# Patient Record
Sex: Female | Born: 1965 | Race: White | Hispanic: No | Marital: Married
Health system: Midwestern US, Community
[De-identification: ages and names within clinical notes are randomized; demographics above are authoritative.]

## PROBLEM LIST (undated history)

## (undated) DIAGNOSIS — R52 Pain, unspecified: Secondary | ICD-10-CM

## (undated) DIAGNOSIS — M05741 Rheumatoid arthritis with rheumatoid factor of right hand without organ or systems involvement: Principal | ICD-10-CM

## (undated) DIAGNOSIS — K7689 Other specified diseases of liver: Secondary | ICD-10-CM

## (undated) DIAGNOSIS — Z1231 Encounter for screening mammogram for malignant neoplasm of breast: Secondary | ICD-10-CM

## (undated) DIAGNOSIS — E559 Vitamin D deficiency, unspecified: Secondary | ICD-10-CM

## (undated) DIAGNOSIS — Z01818 Encounter for other preprocedural examination: Secondary | ICD-10-CM

---

## 2003-07-07 NOTE — Unmapped (Signed)
Signed by   LinkLogic on 07/08/2003 at 06:52:12  Patient: Paula Good  Note: All result statuses are Final unless otherwise noted.    Tests: (1) NM-DEXA SCAN AXIAL [phone])    Order NotePricilla Handler Order Number: [id number]    Order Note:     *** VERIFIED [facility]  Reason:  REPORT TO FAX [phone]  Dict.Staff: Lovey Newcomer    Verified By: Onalee Hua   Ver: 07/07/03   5:06 pm  Exams:  NM-DEXA SCAN AXIAL    CLINICAL: This patient was referred for evaluation of bone  mineral density due to long term use of Prednisone.    FINDINGS:  Bone mineral density (BMD) findings are as follows:    SPINE L1-4:  0.91 gm/cm2  RIGHT HIP:  Femoral neck:  0.86 gm/cm2  Total hip:  0.83 gm/cm2    The following three evaluations are made by comparing this  patient's BMD values with those of young healthy controls and  with those of the patient's age:  (1) The percentage of the patient's remaining bone mineral  compared to the mean of young healthy controls (age 66-40).  (2) The T-score comparing the patient's BMD values with the mean  of values found in young healthy controls and expressed in  standard deviations (SD)*.  (3) The Z-score comparing the patient's BMD values with the mean  of values found in subjects of this patient's age and expressed  in standard deviations (SD).    SPINE (L1-4):  Percentage of remaining bone:  77%  T-score:  -2.2 SD  Z-score:  -2.8 SD    RIGHT HIP:  FEMORAL NECK:  Percentage of remaining bone:  88%  T-score:  -1.0 SD  Z-score:  -1.2 SD    TOTAL HIP:  Percentage of remaining bone:  83%  T-score:  -1.4 SD  Z-score:  -1.7 SD    *The World Health Organization considers a T-score between -1.0  and -2.5 SD as osteopenia and a T-score lower than -2.5 SD from  the mean to indicate osteoporosis.    IMPRESSIONS:  THIS PATIENT'S T-SCORE IN THE RIGHT HIP IS IN THE UPPER PART OF  THE BORDERLINE RANGE BETWEEN -1.0 AND -2.5 AND MEETS THE WORLD  HEALTH [employer]GANIZATION'S CRITERIA FOR  BORDERLINE OSTEOPOROSIS OR  OSTEOPENIA.    THIS PATIENT'S T-SCORE IN THE LUMBAR SPINE IS IN THE LOWEST PART  OF THE BORDERLINE RANGE BETWEEN -1.0 AND -2.5 AND MEETS THE  [employer]LD [employer]IZ[employer]'S CRITERIA FOR BORDERLINE OSTEOPOROSIS  OR OSTEOPENIA.    THE RISK OF FRACTURE INCREASES APPROXIMATELY TWO-FOLD FOR EACH 1  SD DECREASE IN T-SCORE.  DEPENDING ON OTHER FACTORS SUCH AS RISK  OF TRAUMA OR FALLS, FRACTURE RISK MAY BE HIGHER.    PHARMACOLOGIC INTERVENTION FOR THIS PATIENT SHOULD BE  CONSIDERED.    GENERALLY, A REPEAT STUDY TO MONITOR PROGRESSION OF DISEASE OR  RESPONSE TO PHARMACOLOGIC THERAPY SHOULD BE PERFORMED AT 2-3    YEAR INTERVALS.  **** end of result ****    Note: An exclamation mark (!) indicates a result that was not dispersed into   the flowsheet.  Document Creation Date: 07/08/2003 6:52 AM  _______________________________________________________________________    (1) Order result status: Final  Collection or observation date-time: 07/07/2003 14:00:00  Requested date-time: 07/07/2003 14:25:00  Receipt date-time:   Reported date-time: 07/07/2003 17:06:47  Referring Physician: Michelle Piper Cecilia Vancleve  Ordering Physician: Michelle Piper Onesty Clair (NEFFGW)  Specimen Source:   Source: QRS  Filler Order Number: [id number]  Lab site: [facility]

## 2003-07-13 NOTE — Unmapped (Signed)
Signed by   LinkLogic on 07/16/2003 at 11:31:42  Patient: Paula Good  Note: All result statuses are Final unless otherwise noted.    Tests: (1) CT-PELVIS WITH CONTRAST [phone])    Order NotePricilla Handler Order Number: [id number]    Order Note:     *** VERIFIED [facility]  Reason:  RUQ PAIN ELEVATED LFT AND CA  Dict.Staff: Vilinda Flake  Dict.Res: VAGAL, ACHALA  Verified By: Vilinda Flake    Ver: 07/16/03  11:31 am  Exams:  CT-ABDOMEN WITH CONTRAST  CT-PELVIS WITH CONTRAST    CT abdomen pelvis with contrast on 07/13/2003.    Indication: Right upper quadrant pain elevated LFT and CA    Comparison: None.    Technique:    5 mm contiguous axial sections were obtained through the abdomen  and pelvis with portal and intravenous contrast. The  field-of-view studies 36 cm.    Findings:    Patient is status post cholecystectomy. The liver is normal with  no focal lesions. There is no intra or extrahepatic biliary  dilatation. Bilateral kidneys, adrenals, pancreas are normal.  There is mild splenomegaly. Visualized bowel loops are normal.  There is no lymphadenopathy  in the abdomen and pelvis. There is  no ascites.    There is a 3.5 x 3.0 cm simple right ovarian cyst. The uterus is  normal.    The lung bases are normal. Visualized bony structures are normal.      Impression CT abdomen:    1. Mild splenomegaly.    2. Status post cholecystectomy.    3. Normal liver with no focal lesions and no biliary dilatation.      Impression CT pelvis:    Right ovarian cyst. Otherwise normal CT pelvis study.  **** end of result ****    Note: An exclamation mark (!) indicates a result that was not dispersed into   the flowsheet.  Document Creation Date: 07/16/2003 11:31 AM  _______________________________________________________________________    (1) Order result status: Final  Collection or observation date-time: 07/13/2003 16:22:58  Requested date-time: 07/13/2003 15:15:00  Receipt date-time:   Reported  date-time: 07/16/2003 11:31:33  Referring Physician: Michelle Piper Ojani Berenson  Ordering Physician: Michelle Piper Starkeisha Vanwinkle (NEFFGW)  Specimen Source:   Source: QRS  Filler Order Number: [id number]  Lab site: [facility]

## 2003-07-13 NOTE — Unmapped (Signed)
Signed by   LinkLogic on 07/16/2003 at 11:31:41  Patient: Paula Good  Note: All result statuses are Final unless otherwise noted.    Tests: (1) CT-ABDOMEN WITH CONTRAST ([id number])    Order NotePricilla Handler Order Number: [id number]    Order Note:     *** VERIFIED [facility]  Reason:  RUQ PAIN ELEVATED LFT AND CA  Dict.Staff: Vilinda Flake  Dict.Res: VAGAL, ACHALA  Verified By: Vilinda Flake    Ver: 07/16/03  11:31 am  Exams:  CT-ABDOMEN WITH CONTRAST  CT-PELVIS WITH CONTRAST    CT abdomen pelvis with contrast on 07/13/2003.    Indication: Right upper quadrant pain elevated LFT and CA    Comparison: None.    Technique:    5 mm contiguous axial sections were obtained through the abdomen  and pelvis with portal and intravenous contrast. The  field-of-view studies 36 cm.    Findings:    Patient is status post cholecystectomy. The liver is normal with  no focal lesions. There is no intra or extrahepatic biliary  dilatation. Bilateral kidneys, adrenals, pancreas are normal.  There is mild splenomegaly. Visualized bowel loops are normal.  There is no lymphadenopathy  in the abdomen and pelvis. There is  no ascites.    There is a 3.5 x 3.0 cm simple right ovarian cyst. The uterus is  normal.    The lung bases are normal. Visualized bony structures are normal.      Impression CT abdomen:    1. Mild splenomegaly.    2. Status post cholecystectomy.    3. Normal liver with no focal lesions and no biliary dilatation.      Impression CT pelvis:    Right ovarian cyst. Otherwise normal CT pelvis study.  **** end of result ****    Note: An exclamation mark (!) indicates a result that was not dispersed into   the flowsheet.  Document Creation Date: 07/16/2003 11:31 AM  _______________________________________________________________________    (1) Order result status: Final  Collection or observation date-time: 07/13/2003 16:23:00  Requested date-time: 07/13/2003 15:15:00  Receipt date-time:   Reported  date-time: 07/16/2003 11:31:33  Referring Physician: Michelle Piper Brizeida Mcmurry  Ordering Physician: Michelle Piper Kaislyn Gulas (NEFFGW)  Specimen Source:   Source: QRS  Filler Order Number: [id number]  Lab site: [facility]

## 2003-07-21 NOTE — Unmapped (Signed)
Signed by Lolita Patella. Gerrety RMA on 07/21/2003 at 10:31:32    Phone Note   Call from Other Clinic  Caller: BARRETT/DR PALASCHEK/[phone]  Call For: Neff/Nancy  Reason for Call: Need Referral Information  Request: Talk with Nurse  Summary of call: Needs to know why this patient needs to be seen by them before they can schedule. Needs a letter of referral from Dr.Neff also.    This department is not currently using the EMR system. Please see the paper chart for this patient for the response to this phone message.  Initial call taken by: Lolita Patella. Gerrety RMA,  July 21, 2003 10:31 AM

## 2003-07-30 NOTE — Unmapped (Signed)
Signed by   LinkLogic on 08/04/2003 at 10:55:57  Patient: Paula Good  Note: All result statuses are Final unless otherwise noted.    Tests: (1) MRI-ANGIO ABDOMEN W/WO CON [phone])    Order NotePricilla Handler Order Number: [id number]    Order Note:     *** VERIFIED ***  [facility]  Reason:  PANCREATITIS AND SPLENOMEGALLY  Dict.Staff: Zachery Conch    Verified By: Zachery Conch       Ver: 08/04/03  10:55 am  Exams:  Rennie Natter W/WO CONTRAST  MRI-ANGIO ABDOMEN W/WO CON    Dictated: 07/30/03 at 15:10    07/30/03  MRI ABDOMEN WITH & WITHOUT CONTRAST, MRA ABDOMEN WITH CONTRAST:  INDICATION: Pancreatitis and splenomegaly.    TECHNIQUE:  MRI/MRA of the abdomen performed without and with  intravenous Gadolinium administration.  The protocol utilized is  the same as for pretransplant evaluation.    FINDINGS:    The spleen is enlarged measuring up to 13.9 x 6.5 cm transverse.    The spleen shows normal signal.    The liver shows normal signal.    The pancreas is unremarkable.  The pancreatic duct, common bile  duct, and intrahepatic ducts are within normal limits.    The gallbladder is not identified, compatible with previous  cholecystectomy.    No abnormal lymph nodes are detected.    Visualized marrow signal is normal.    The kidneys, adrenal glands, and retroperitoneal structures show  normal signal.    Bilateral breast prostheses are incompletely seen.    MRI ABDOMEN IMPRESSION:    1.  NONSPECIFIC SPLENOMEGALY WITHOUT FOCAL SIGNAL ABNORMALITY.      MRA FINDINGS:    The arterial phase is limited by motion artifact.  Dual left and  single right renal arteries are present without significant  stenosis.  The celiac and superior mesenteric artery origins are  patent.  The degree of motion precludes evaluation for accessory  or replaced hepatic vessels.    The portal venous phase shows significantly less motion  artifact.  The splenic vein, superior mesenteric vein, and  portal vein are widely patent.  Hepatic  veins are normal in  appearance.    MRA IMPRESSION:    1.  NEGATIVE MRA OF THE ABDOMEN.  VENOUS PHASE ALSO SHOWS NO  ABNORMALITY.      /agf  **** end of result ****    Note: An exclamation mark (!) indicates a result that was not dispersed into   the flowsheet.  Document Creation Date: 08/04/2003 10:55 AM  _______________________________________________________________________    (1) Order result status: Final  Collection or observation date-time: 07/30/2003 10:21:00  Requested date-time: 07/30/2003 10:21:00  Receipt date-time:   Reported date-time: 08/04/2003 10:55:47  Referring Physician: Michelle Piper Dewell Monnier  Ordering Physician: Michelle Piper Sangeeta Youse (NEFFGW)  Specimen Source:   Source: QRS  Filler Order Number: [id number]  Lab site: [facility]

## 2003-07-30 NOTE — Unmapped (Signed)
Signed by   LinkLogic on 08/04/2003 at 10:55:57  Patient: Ladean Tegtmeyer  Note: All result statuses are Final unless otherwise noted.    Tests: (1) MRI-ABDOMEN W/WO CONTRAST [phone])    Order NotePricilla Handler Order Number: [id number]    Order Note:     *** VERIFIED ***  [facility]  Reason:  PANCREATITIS AND SPLENOMEGALLY  Dict.Staff: Zachery Conch    Verified By: Zachery Conch       Ver: 08/04/03  10:55 am  Exams:  Rennie Natter W/WO CONTRAST  MRI-ANGIO ABDOMEN W/WO CON    Dictated: 07/30/03 at 15:10    07/30/03  MRI ABDOMEN WITH & WITHOUT CONTRAST, MRA ABDOMEN WITH CONTRAST:  INDICATION: Pancreatitis and splenomegaly.    TECHNIQUE:  MRI/MRA of the abdomen performed without and with  intravenous Gadolinium administration.  The protocol utilized is  the same as for pretransplant evaluation.    FINDINGS:    The spleen is enlarged measuring up to 13.9 x 6.5 cm transverse.    The spleen shows normal signal.    The liver shows normal signal.    The pancreas is unremarkable.  The pancreatic duct, common bile  duct, and intrahepatic ducts are within normal limits.    The gallbladder is not identified, compatible with previous  cholecystectomy.    No abnormal lymph nodes are detected.    Visualized marrow signal is normal.    The kidneys, adrenal glands, and retroperitoneal structures show  normal signal.    Bilateral breast prostheses are incompletely seen.    MRI ABDOMEN IMPRESSION:    1.  NONSPECIFIC SPLENOMEGALY WITHOUT FOCAL SIGNAL ABNORMALITY.      MRA FINDINGS:    The arterial phase is limited by motion artifact.  Dual left and  single right renal arteries are present without significant  stenosis.  The celiac and superior mesenteric artery origins are  patent.  The degree of motion precludes evaluation for accessory  or replaced hepatic vessels.    The portal venous phase shows significantly less motion  artifact.  The splenic vein, superior mesenteric vein, and  portal vein are widely patent.  Hepatic  veins are normal in  appearance.    MRA IMPRESSION:    1.  NEGATIVE MRA OF THE ABDOMEN.  VENOUS PHASE ALSO SHOWS NO  ABNORMALITY.      /agf  **** end of result ****    Note: An exclamation mark (!) indicates a result that was not dispersed into   the flowsheet.  Document Creation Date: 08/04/2003 10:55 AM  _______________________________________________________________________    (1) Order result status: Final  Collection or observation date-time: 07/30/2003 11:15:00  Requested date-time: 07/30/2003 10:21:00  Receipt date-time:   Reported date-time: 08/04/2003 10:55:47  Referring Physician: Michelle Piper Mireyah Chervenak  Ordering Physician: Michelle Piper Jayveion Stalling (NEFFGW)  Specimen Source:   Source: QRS  Filler Order Number: [id number]  Lab site: [facility]

## 2003-08-03 NOTE — Unmapped (Signed)
Signed by   LinkLogic on 08/04/2003 at 14:21:46  Patient: Paula Good  Note: All result statuses are Final unless otherwise noted.    Tests: (1) HOMOCYSTEINE, SERUM (HOMOCYT)  ! Homocysteine              9.73 MCMOL/L                5 - 12      Testing performed by [facility] [address]  [place], [place]., [address]    Note: An exclamation mark (!) indicates a result that was not dispersed into   the flowsheet.  Document Creation Date: 08/04/2003 2:21 PM  _______________________________________________________________________    (1) Order result status: Final  Collection or observation date-time: 08/03/2003 15:34  Requested date-time: 08/03/2003 15:34  Receipt date-time: 08/04/2003 14:21  Reported date-time: 08/04/2003 14:21  Referring Physician:    Ordering Physician: Michelle Piper Kemora Pinard (NEFFGW)  Specimen Source: S&SERUM     SST N&SST (2 ml S Refrig)  Source: [facility] Order Number: [id number] LA01  Lab site: [facility]  [id number]

## 2003-08-03 NOTE — Unmapped (Signed)
Signed by   LinkLogic on 08/04/2003 at 12:08:15  Patient: Allycia Avina  Note: All result statuses are Final unless otherwise noted.    Tests: (1) ANTI-THROMBIN 3, (ATIII) (AT3)  ! AntiThromb III, Func [L]  62 % Normal                 80-120    Note: An exclamation mark (!) indicates a result that was not dispersed into   the flowsheet.  Document Creation Date: 08/04/2003 12:08 PM  _______________________________________________________________________    (1) Order result status: Final  Collection or observation date-time: 08/03/2003 15:34  Requested date-time: 08/03/2003 15:34  Receipt date-time: 08/03/2003 17:56  Reported date-time: 08/04/2003 12:08  Referring Physician:    Ordering Physician: Michelle Piper Alyas Creary (NEFFGW)  Specimen Source: P&PLASMA     BLUE3&3 BLUE (Plasma FRZ ALIQUOTS)  Source: [facility] Order Number: [id number] LA01  Lab site: [facility]  [id number]

## 2003-08-03 NOTE — Unmapped (Signed)
Signed by   LinkLogic on 08/04/2003 at 12:08:23  Patient: Paula Good  Note: All result statuses are Final unless otherwise noted.    Tests: (1) PROTEIN S, FUNCTIONAL (PROSFX)  ! Protein S, Func      [L]  48 % Normal                 56-133      Reference Range: Female 77-130%; Female 56-133%.  Functional  protein S may   be falsely decreased when factor VIII is elevated (>250%) and / or the patient   is positive for the factor V (Leiden) variant.    Note: An exclamation mark (!) indicates a result that was not dispersed into   the flowsheet.  Document Creation Date: 08/04/2003 12:08 PM  _______________________________________________________________________    (1) Order result status: Final  Collection or observation date-time: 08/03/2003 15:34  Requested date-time: 08/03/2003 15:34  Receipt date-time: 08/03/2003 17:56  Reported date-time: 08/04/2003 12:08  Referring Physician:    Ordering Physician: Michelle Piper Aundrea Horace (NEFFGW)  Specimen Source: P&PLASMA     BLUE3&3 BLUE (Plasma FRZ ALIQUOTS)  Source: [facility] Order Number: [id number] LA01  Lab site: [facility]  [id number]

## 2003-08-03 NOTE — Unmapped (Signed)
Signed by   LinkLogic on 08/04/2003 at 12:08:25  Patient: Paula Good  Note: All result statuses are Final unless otherwise noted.    Tests: (1) PROTEIN C, FUNCTIONAL (PROCFX)  ! Protein C, Func      [L]  38 % Normal                 84-158      Functional protein C may be falsely decreased when factor VIII is elevated   (>250%) and / or the patient is positive for the factor V (Leiden) variant.    Note: An exclamation mark (!) indicates a result that was not dispersed into   the flowsheet.  Document Creation Date: 08/04/2003 12:08 PM  _______________________________________________________________________    (1) Order result status: Final  Collection or observation date-time: 08/03/2003 15:34  Requested date-time: 08/03/2003 15:34  Receipt date-time: 08/03/2003 17:56  Reported date-time: 08/04/2003 12:08  Referring Physician:    Ordering Physician: Michelle Piper Ardean Melroy (NEFFGW)  Specimen Source: P&PLASMA     BLUE3&3 BLUE (Plasma FRZ ALIQUOTS)  Source: [facility] Order Number: [id number] LA01  Lab site: [facility]  [id number]

## 2003-08-03 NOTE — Unmapped (Signed)
Signed by   LinkLogic on 08/04/2003 at 12:16:53  Patient: Paula Good  Note: All result statuses are Final unless otherwise noted.    Tests: (1) FACTOR 5 LEIDEN SCREEN (F5LS)  ! Act Prot C Resist         2.49                        2.10-3.50    Note: An exclamation mark (!) indicates a result that was not dispersed into   the flowsheet.  Document Creation Date: 08/04/2003 12:16 PM  _______________________________________________________________________    (1) Order result status: Final  Collection or observation date-time: 08/03/2003 15:34  Requested date-time: 08/03/2003 15:34  Receipt date-time: 08/03/2003 17:56  Reported date-time: 08/04/2003 12:16  Referring Physician:    Ordering Physician: Michelle Piper Zahriyah Joo (NEFFGW)  Specimen Source: P&PLASMA     BLU + LAV&BLUE (P Frz) + LAV (Refrig)  Source: [facility] Order Number: [id number] LA01  Lab site: [facility]  [id number]

## 2003-08-19 NOTE — Unmapped (Signed)
Signed by   LinkLogic on 08/19/2003 at 11:40:31  Patient: Paula Good  Note: All result statuses are Final unless otherwise noted.    Tests: (1) US-ABDOMEN LIMITED ([id number])    Order NotePricilla Handler Order Number: [id number]    Order Note:     *** VERIFIED ***  [facility]  Reason:  HEP C ABD PAIN, USE DOPPLER  Dict.Staff: Loni Dolly    Verified By: Loni Dolly       Ver: 08/19/03  11:40 am  Exams:  US-ABDOMEN LIMITED      Abdominal ultrasound    Clinical history is hepatitis C and splenomegaly.    Findings:    The gallbladder is surgically absent. The common bile duct is 4  mm in diameter, and is not dilated. There is no abnormality of  the pancreas. The liver echogenicity appears normal. The spleen  is enlarged measuring 14 cm in length. The right kidney is 11.6  cm in length and the left kidney is 11.8 cm in length. The  is  no hydronephrosis.    There is patency of the main and right and left portal veins  with hepatofugal flow. No ascites is present. No varices are  identified.    Impression :    Splenomegaly with hepatofugal flow. Previous  cholecystectomy.  **** end of result ****    Note: An exclamation mark (!) indicates a result that was not dispersed into   the flowsheet.  Document Creation Date: 08/19/2003 11:40 AM  _______________________________________________________________________    (1) Order result status: Final  Collection or observation date-time: 08/19/2003 09:03:00  Requested date-time: 08/19/2003 09:01:00  Receipt date-time:   Reported date-time: 08/19/2003 11:40:23  Referring Physician: Michelle Piper Prestin Munch  Ordering Physician: Michelle Piper Berdella Bacot (NEFFGW)  Specimen Source:   Source: QRS  Filler Order Number: [id number]  Lab site: [facility]

## 2003-08-19 NOTE — Unmapped (Signed)
Signed by   LinkLogic on 08/19/2003 at 15:49:31    Appointment status changed to no show by  LinkLogic on 08/19/2003 3:49 PM.    No Show Comments  ----------------  NEW PATIENT     Appointment Information  -----------------------  Appt Type:         Date:  Wednesday, Aug 19, 2003       Time:  2:45 PM for 60 min    Urgency:  Routine    Made By:  Jannett Celestine   To Visit:  Lita Mains MD     Reason:  NEW PATIENT     Appt Comments  -------------  -- 08/19/03 15:49: (SPENCERA) NO SHOW --  NEW PATIENT   BLOOD COUNTS ABNOMAL    -- 08/14/03 15:25: (COMBSVJ) BOOKED --  Routine  at 08/19/2003 2:45 PM for 60 min  NEW PATIENT   BLOOD COUNTS ABNOMAL

## 2003-08-21 NOTE — Unmapped (Signed)
Signed by Vonzella Nipple Combs on 08/21/2003 at 13:59:26    Phone Note   Call from Patient  Call back at [facility] Phone [phone]  Call back at [phone]  Caller: Patient  Call For: NEFF  Reason for Call: Talk to Nurse  Summary of Call: LFT SIDE PAIN. ON SCALE 1-10, ITS A 7.  URINE IS NORMAL WHEN PT LOOKS AT IT.  WANTS TO KNOW WHAT SHE SHOULD DO?    This department is not currently using the EMR system. Please see the paper chart for this patient for the response to this phone message.    Initial call taken by: Burnis Medin,  Aug 21, 2003 1:59 PM

## 2003-08-26 NOTE — Unmapped (Signed)
Signed by   LinkLogic on 09/08/2003 at 15:30:47  Patient: Paula Good  Note: All result statuses are Final unless otherwise noted.    Tests: (1) PROTEIN S, ANTIGENIC (PROSAG)    Order Note: blu5 lav4 sst2 ug1 ured  ! Protein S, Ag Total       71 % Normal                 63-113      TESTING WAS REPEATED.  ! Protein S, Ag Free        95 % Normal                 66-111    Note: An exclamation mark (!) indicates a result that was not dispersed into   the flowsheet.  Document Creation Date: 09/08/2003 3:30 PM  _______________________________________________________________________    (1) Order result status: Final  Collection or observation date-time: 08/26/2003 16:15  Requested date-time: 08/26/2003 16:15  Receipt date-time: 08/26/2003 18:35  Reported date-time: 09/08/2003 15:30  Referring Physician:    Ordering Physician: Lita Mains [facility])  Specimen Source: P&PLASMA     BLUE3&3 BLUE (Plasma FRZ ALIQUOTS)  Source: [facility] Order Number: [id number] LA01  Lab site: [facility]  [id number]

## 2003-08-26 NOTE — Unmapped (Signed)
Signed by   LinkLogic on 08/27/2003 at 05:06:25  Patient: Paula Good  Note: All result statuses are Final unless otherwise noted.    Tests: (1) IRON STUDIES (IS)    Order Note: blu5 lav4 sst2 ug1 ured  ! Iron                 [L]  22 ug/dL                    32-440  ! TIBC                 [H]  471 mg/dL                   102-725  ! %SAT                 [L]  5 %                         20-55    Note: An exclamation mark (!) indicates a result that was not dispersed into   the flowsheet.  Document Creation Date: 08/27/2003 5:06 AM  _______________________________________________________________________    (1) Order result status: Final  Collection or observation date-time: 08/26/2003 16:15  Requested date-time: 08/26/2003 16:15  Receipt date-time: 08/26/2003 18:35  Reported date-time: 08/27/2003 05:06  Referring Physician:    Ordering Physician: Lita Mains [facility])  Specimen Source: S&SERUM     SST REFRIG&SST (Refrig)  Source: Butler [place] Order Number: [id number] LA01  Lab site: [facility]  [id number]

## 2003-08-26 NOTE — Unmapped (Signed)
Signed by   LinkLogic on 08/27/2003 at 14:24:59  Patient: Paula Good  Note: All result statuses are Final unless otherwise noted.    Tests: (1) ?LUPUS PTT (LPTT)    Order Note: blu5 lav4 sst2 ug1 ured  ! aPTT                      33.7 seconds                25.7-35.7    Note: An exclamation mark (!) indicates a result that was not dispersed into   the flowsheet.  Document Creation Date: 08/27/2003 2:24 PM  _______________________________________________________________________    (1) Order result status: Final  Collection or observation date-time: 08/26/2003 16:15  Requested date-time: 08/26/2003 16:15  Receipt date-time: 08/26/2003 18:35  Reported date-time: 08/27/2003 14:24  Referring Physician:    Ordering Physician: Lita Mains [facility])  Specimen Source: P&PLASMA     BLUE3&3 BLUE (Plasma FRZ ALIQUOTS)  Source: Butler [place] Order Number: [id number] LA01  Lab site: [facility]  [id number]

## 2003-08-26 NOTE — Unmapped (Signed)
Signed by   LinkLogic on 08/26/2003 at 21:18:33  Patient: Paula Good  Note: All result statuses are Final unless otherwise noted.    Tests: (1) FERRITIN (FERRIT)    Order Note: blu5 lav4 sst2 ug1 ured  ! Ferritin             [L]  6.6 ng/mL                   10.0-291.0    Note: An exclamation mark (!) indicates a result that was not dispersed into   the flowsheet.  Document Creation Date: 08/26/2003 9:18 PM  _______________________________________________________________________    (1) Order result status: Final  Collection or observation date-time: 08/26/2003 16:15  Requested date-time: 08/26/2003 16:15  Receipt date-time: 08/26/2003 18:35  Reported date-time: 08/26/2003 21:18  Referring Physician:    Ordering Physician: Lita Mains [facility])  Specimen Source: S&SERUM     SST REFRIG&SST (Refrig)  Source: Butler [place] Order Number: [id number] LA01  Lab site: [facility]  [id number]

## 2003-08-26 NOTE — Unmapped (Signed)
Signed by   LinkLogic on 08/27/2003 at 14:24:58  Patient: Paula Good  Note: All result statuses are Final unless otherwise noted.    Tests: (1) ?LUPUS PT (LPT)    Order Note: blu5 lav4 sst2 ug1 ured  ! Prothrombin Time          14.9 seconds                12.3-15.0  ! INR.                      1.1                         0.9-1.1    Note: An exclamation mark (!) indicates a result that was not dispersed into   the flowsheet.  Document Creation Date: 08/27/2003 2:24 PM  _______________________________________________________________________    (1) Order result status: Final  Collection or observation date-time: 08/26/2003 16:15  Requested date-time: 08/26/2003 16:15  Receipt date-time: 08/26/2003 18:35  Reported date-time: 08/27/2003 14:24  Referring Physician:    Ordering Physician: Lita Mains [facility])  Specimen Source: P&PLASMA     BLUE3&3 BLUE (Plasma FRZ ALIQUOTS)  Source: Butler [place] Order Number: [id number] LA01  Lab site: [facility]  [id number]

## 2003-08-26 NOTE — Unmapped (Signed)
Signed by   LinkLogic on 08/27/2003 at 07:12:56  Patient: Paula Good  Note: All result statuses are Final unless otherwise noted.    Tests: (1) DIFFERENTIAL, MANUAL (MANDIF)    Order Note: blu5 lav4 sst2 ug1 ured    Order Note: Manual WBC differential performed per review criteria approved   by the medical director.  ! Platelet Estimate         Normal  ! Band                      3.0 %                       0.0-9.0  ! Neutrophil                49.0 %                      40.0-80.0  ! Lymphocyte                34.0 %                      15.0-45.0  ! Atypical Lymph            1.0 %                       0.0-9.0      Atypical lymphocyte(s)= reactive, benign, viral lymphocyte(s)  ! Monocyte                  8.0 %                       0.0-12.0  ! Eosinophil                2.0 %                       0.0-8.0  ! Basophils            [H]  3.0 %                       0.0-1.0  ! ABS NEUT                  2.1 10*3/uL                 1.0-8.0  ! Abs LYMPH                 1.4 10*3/uL                 0.6-3.2  ! Abs MONO                  0.3 10*3/uL                 0.0-1.0  ! Abs EOS                   0.1 10*3/uL                 0.0-0.6  ! Abs BASO                  0.1 10*3/uL                 0.0-0.1  ! Anisocytosis  Present  ! Microcytosis              Present  ! Polychromasia             Present  ! Hypochromasia             Present    Note: An exclamation mark (!) indicates a result that was not dispersed into   the flowsheet.  Document Creation Date: 08/27/2003 7:12 AM  _______________________________________________________________________    (1) Order result status: Final  Collection or observation date-time: 08/26/2003 16:15  Requested date-time: 08/26/2003 16:15  Receipt date-time: 08/26/2003 18:35  Reported date-time: 08/27/2003 07:12  Referring Physician:    Ordering Physician: Lita Mains [facility])  Specimen Source: WB&WHOLE BLOOD     LV5  REFRIG&LAV  (Refrig)  Source: Butler Denmark Order Number:  [id number] LA01  Lab site: [facility]  [id number]

## 2003-08-26 NOTE — Unmapped (Signed)
Signed by   LinkLogic on 08/26/2003 at 19:30:25  Patient: Paula Good  Note: All result statuses are Final unless otherwise noted.    Tests: (1) PROTIME (PT)    Order Note: blu5 lav4 sst2 ug1 ured  ! Prothrombin Time     [H]  15.3 seconds                12.3-15.0  ! INR                  [H]  1.2                         0.9-1.1      RECOMMENDED THERAPEUTIC RANGES USING INR :    Stable Oral Anticoagulant Therapy:         2.0 - 3.0    Mechanical Prosthetic Heart Valve:         2.5 - 3.5    Recurrent Acute Myocardial Infarction:    2.5 - 3.5    Note: An exclamation mark (!) indicates a result that was not dispersed into   the flowsheet.  Document Creation Date: 08/26/2003 7:30 PM  _______________________________________________________________________    (1) Order result status: Final  Collection or observation date-time: 08/26/2003 16:15  Requested date-time: 08/26/2003 16:15  Receipt date-time: 08/26/2003 18:35  Reported date-time: 08/26/2003 19:30  Referring Physician:    Ordering Physician: Lita Mains [facility])  Specimen Source: P&PLASMA     BLU REF&BLUE (Refrig)  Source: Butler Denmark Order Number: [id number] LA01  Lab site: [facility]  [id number]

## 2003-08-26 NOTE — Unmapped (Signed)
Signed by   LinkLogic on 08/28/2003 at 10:27:09  Patient: Paula Good  Note: All result statuses are Final unless otherwise noted.    Tests: (1) URINE CULTURE (UC)  ! Clinical Report           Result Below...        RESULT: Specimen: URINE  Collected: 08/26/2003 16:15  (1) Gram Stn = LabDecision; Antib. Sens. = LabDecision      blu5 lav4 sst2 ug1 ured     Status: Final     Last Update: 08/28/2003 10:27          Culture Result: (Final)      Mixed Skin/Urogenital Flora.  No Further Workup.    Note: An exclamation mark (!) indicates a result that was not dispersed into   the flowsheet.  Document Creation Date: 08/28/2003 10:27 AM  _______________________________________________________________________    (1) Order result status: Final  Collection or observation date-time: 08/26/2003 16:15  Requested date-time: 08/26/2003 16:15  Receipt date-time: 08/26/2003 21:10  Reported date-time: 08/28/2003 10:27  Referring Physician:    Ordering Physician: Lita Mains [facility])  Specimen Source: U&URINE     UGRS&Boric Acid, Small Urine Tube  Source: Butler Denmark Order Number: [id number] LA01  Lab site: [facility]  [id number]

## 2003-08-26 NOTE — Unmapped (Signed)
Signed by   LinkLogic on 08/28/2003 at 14:42:29  Patient: Paula Good  Note: All result statuses are Final unless otherwise noted.    Tests: (1) PROTHROMBIN MUTATION BY PCR (PTMUT)  ! Prothrombin Mutation      NEG                         NEG      This sample was negative for the Prothrombin (G20210A) gene allele, an   allele associated with increased levels of prothrombin and thrombotic   predisposition.    DNA was extracted from patient leukocytes, and was then probed for the   Prothrombin (G20210A) gene allele by using the polymerase chain reaction and   melting point analysis of allele-specific fluorescent hybridization products.    Agreement between this assay and sequencing for detection of the variant   allele was 99.5%.    The prothrombin (G20210A) gene allele regulates the synthesis of prothrombin,   an elevated level of which is associated with thrombotic predisposition.  The   variant prothrombin allele is inherited as an autosomal dominant.  When   compared to individuals who are homozygous for the wild-type prothrombin gene,   individuals heterozygous for the variant prothrombin gene have a 3- to 9   -fold increased risk of venous thrombosis.  The rare individuals homozygous   for the variant allele have an even greater risk of venous thrombosis.     Patients heterozygous for both the Factor V Leiden and prothrombin gene   variants are prone to earlier onset, more severe thromboses than individuals   heterozygous for only one of the two mutations.    It is recommended that any individual for whom genetic testing is performed   should receive pre- and post-test counseling.  Informed consent is highly   recommended.      Note: An exclamation mark (!) indicates a result that was not dispersed into   the flowsheet.  Document Creation Date: 08/28/2003 2:42 PM  _______________________________________________________________________    (1) Order result status: Final  Collection or observation date-time:  08/26/2003 16:15  Requested date-time: 08/26/2003 16:15  Receipt date-time: 08/27/2003 11:29  Reported date-time: 08/28/2003 14:42  Referring Physician:    Ordering Physician: Lita Mains [facility])  Specimen Source: WB&WHOLE BLOOD     LAV L&LAV (4 ml WB Refrig)  Source: Butler Denmark Order Number: [id number] LA01  Lab site: [facility]  [id number]

## 2003-08-26 NOTE — Unmapped (Signed)
Signed by   LinkLogic on 08/26/2003 at 19:52:24  Patient: Paula Good  Note: All result statuses are Final unless otherwise noted.    Tests: (1) URINALYSIS (UA)    Order Note: blu5 lav4 sst2 ug1 ured    Order Note: Microscopic testing is not performed when the dipstick is   negative for blood, leukocyte, protein and nitrite.  ! Color                     Yellow                      Yellow,Straw  ! Clarity                   Clear                       Clear  ! Specific Gravity          1.015                       1.005-1.035  ! pH                        7.0                         5.0-8.0  ! Protein, urine            Negative mg/dL              Negative  ! Glucose, Urine            Negative mg/dL              Negative  ! Ketone                    Negative mg/dL              Negative  ! Bilirubin, Urine          Negative                    Negative  ! Blood                     Negative                    Negative  ! Nitrite                   Negative                    Negative  ! Urobilinogen              0.2 EU/dL                   5.6-4.3  ! Leukocyte Esterase        Negative                    Negative    Note: An exclamation mark (!) indicates a result that was not dispersed into   the flowsheet.  Document Creation Date: 08/26/2003 7:52 PM  _______________________________________________________________________    (1) Order result status: Final  Collection or observation date-time: 08/26/2003 16:15  Requested date-time: 08/26/2003 16:15  Receipt date-time: 08/26/2003 18:35  Reported date-time: 08/26/2003 19:52  Referring Physician:    Ordering Physician: Evalee Jefferson  [facility])  Specimen Source: U&URINE     UGRLR&Boric Acid,Large Urine Tube Refrig.  Source: Butler Denmark Order Number: [id number] LA01  Lab site: [facility]      [place]  [place]  [address]

## 2003-08-26 NOTE — Unmapped (Signed)
Signed by   LinkLogic on 08/28/2003 at 17:47:27  Patient: Paula Good  Note: All result statuses are Final unless otherwise noted.    Tests: (1) PROTEIN C, ANTIGENIC (PROCAG)    Order Note: blu5 lav4 sst2 ug1 ured  ! Protein C, Ag        [L]  71 % Normal                 73-157    Note: An exclamation mark (!) indicates a result that was not dispersed into   the flowsheet.  Document Creation Date: 08/28/2003 5:47 PM  _______________________________________________________________________    (1) Order result status: Final  Collection or observation date-time: 08/26/2003 16:15  Requested date-time: 08/26/2003 16:15  Receipt date-time: 08/26/2003 18:35  Reported date-time: 08/28/2003 17:47  Referring Physician:    Ordering Physician: Lita Mains [facility])  Specimen Source: P&PLASMA     BLUE3&3 BLUE (Plasma FRZ ALIQUOTS)  Source: Butler [place] Order Number: [id number] LA01  Lab site: [facility]  [id number]

## 2003-08-26 NOTE — Unmapped (Signed)
Signed by   LinkLogic on 08/28/2003 at 19:09:11  Patient: Paula Good  Note: All result statuses are Final unless otherwise noted.    Tests: (1) ANTI-CARDIOLIPIN ANTIBODY (ANTICA)    Order Note: blu5 lav4 sst2 ug1 ured  ! Anti-Cardiolipin IgG      5 GPL                       0-22  ! Anti-Cardiolipin IgM      3 MPL                       0-10    Note: An exclamation mark (!) indicates a result that was not dispersed into   the flowsheet.  Document Creation Date: 08/28/2003 7:09 PM  _______________________________________________________________________    (1) Order result status: Final  Collection or observation date-time: 08/26/2003 16:15  Requested date-time: 08/26/2003 16:15  Receipt date-time: 08/26/2003 18:35  Reported date-time: 08/28/2003 19:08  Referring Physician:    Ordering Physician: Lita Mains [facility])  Specimen Source: S&SERUM     SST E&SST (1 ml Sharrell Ku)  Source: Butler Denmark Order Number: [id number] LA01  Lab site: [facility]  [id number]

## 2003-08-26 NOTE — Unmapped (Signed)
Signed by   LinkLogic on 08/27/2003 at 14:24:57  Patient: Connelly Herrington  Note: All result statuses are Final unless otherwise noted.    Tests: (1) ANTI-THROMBIN 3, (ATIII) (AT3)    Order Note: blu5 lav4 sst2 ug1 ured  ! AntiThromb III, Func [L]  76 % Normal                 80-120    Note: An exclamation mark (!) indicates a result that was not dispersed into   the flowsheet.  Document Creation Date: 08/27/2003 2:24 PM  _______________________________________________________________________    (1) Order result status: Final  Collection or observation date-time: 08/26/2003 16:15  Requested date-time: 08/26/2003 16:15  Receipt date-time: 08/26/2003 18:35  Reported date-time: 08/27/2003 14:24  Referring Physician:    Ordering Physician: Lita Mains [facility])  Specimen Source: P&PLASMA     BLUE3&3 BLUE (Plasma FRZ ALIQUOTS)  Source: Butler Denmark Order Number: [id number] LA01  Lab site: [facility]  [id number]

## 2003-08-26 NOTE — Unmapped (Signed)
Signed by   LinkLogic on 08/27/2003 at 14:25:01  Patient: Paula Good  Note: All result statuses are Final unless otherwise noted.    Tests: (1) ?LUPUS DRVVT (LDRVVT)    Order Note: blu5 lav4 sst2 ug1 ured  ! Dil Viper Venom Time      33.3 seconds                33.1-42.7    Note: An exclamation mark (!) indicates a result that was not dispersed into   the flowsheet.  Document Creation Date: 08/27/2003 2:25 PM  _______________________________________________________________________    (1) Order result status: Final  Collection or observation date-time: 08/26/2003 16:15  Requested date-time: 08/26/2003 16:15  Receipt date-time: 08/26/2003 18:35  Reported date-time: 08/27/2003 14:24  Referring Physician:    Ordering Physician: Lita Mains [facility])  Specimen Source: P&PLASMA     BLUE3&3 BLUE (Plasma FRZ ALIQUOTS)  Source: Butler Denmark Order Number: [id number] LA01  Lab site: [facility]  [id number]

## 2003-08-26 NOTE — Unmapped (Signed)
Signed by   LinkLogic on 08/26/2003 at 19:31:23  Patient: Paula Good  Note: All result statuses are Final unless otherwise noted.    Tests: (1) aPTT (PTT)    Order Note: blu5 lav4 sst2 ug1 ured  ! aPTT                      34.2 seconds                25.7-35.7    Note: An exclamation mark (!) indicates a result that was not dispersed into   the flowsheet.  Document Creation Date: 08/26/2003 7:31 PM  _______________________________________________________________________    (1) Order result status: Final  Collection or observation date-time: 08/26/2003 16:15  Requested date-time: 08/26/2003 16:15  Receipt date-time: 08/26/2003 18:35  Reported date-time: 08/26/2003 19:31  Referring Physician:    Ordering Physician: Lita Mains [facility])  Specimen Source: P&PLASMA     BLU REF&BLUE (Refrig)  Source: Butler Denmark Order Number: [id number] LA01  Lab site: [facility]  [id number]

## 2003-09-09 NOTE — Unmapped (Signed)
Signed by   LinkLogic on 09/09/2003 at 21:42:35  Patient: Suan Santillanes  Note: All result statuses are Final unless otherwise noted.    Tests: (1) FOLIC ACID (FOLATE)    Order Note: SST2 LAV1 BLU1 UPHEMO  ! Folic Acid                6.4 ng/mL      The Folic Acid Reference Range is >5.4 ng/mL.    Note: An exclamation mark (!) indicates a result that was not dispersed into   the flowsheet.  Document Creation Date: 09/09/2003 9:42 PM  _______________________________________________________________________    (1) Order result status: Final  Collection or observation date-time: 09/09/2003 14:20  Requested date-time: 09/09/2003 14:20  Receipt date-time: 09/09/2003 17:57  Reported date-time: 09/09/2003 21:42  Referring Physician:    Ordering Physician: Lita Mains [facility])  Specimen Source: S&SERUM     RED C&RED (1 ml S Refrig)  Source: Butler Denmark Order Number: [id number] LA01  Lab site: [facility]  [id number]

## 2003-09-09 NOTE — Unmapped (Signed)
Signed by   LinkLogic on 09/09/2003 at 19:03:12  Patient: Shalamar Benavidez  Note: All result statuses are Final unless otherwise noted.    Tests: (1) aPTT (PTT)    Order Note: SST2 LAV1 BLU1 UPHEMO  ! aPTT                      35.3 seconds                25.7-35.7      Specimen is greater than four hours old.    Note: An exclamation mark (!) indicates a result that was not dispersed into   the flowsheet.  Document Creation Date: 09/09/2003 7:03 PM  _______________________________________________________________________    (1) Order result status: Final  Collection or observation date-time: 09/09/2003 14:20  Requested date-time: 09/09/2003 14:20  Receipt date-time: 09/09/2003 17:57  Reported date-time: 09/09/2003 19:03  Referring Physician:    Ordering Physician: Lita Mains [facility])  Specimen Source: P&PLASMA     BLU REF&BLUE (Refrig)  Source: Butler Denmark Order Number: [id number] LA01  Lab site: [facility]  [id number]

## 2003-09-09 NOTE — Unmapped (Signed)
Signed by   LinkLogic on 09/09/2003 at 22:14:04  Patient: Paula Good  Note: All result statuses are Final unless otherwise noted.    Tests: (1) COMPREHENSIVE METABOLIC PANEL (METAPNL)    Order Note: SST2 LAV1 BLU1 UPHEMO  ! Sodium                    139 mEq/L                   136-146  ! Potassium                 3.6 mEq/L                   3.5-5.0  ! Chloride                  106 mEq/L                   100-110  ! CO2                       24 mEq/L                    19-32  ! Anion Gap                 9 mEq/L                     3-16  ! BUN                       8 mg/dL                     0-10  ! Creatinine                0.8 mg/dL                   2.7-2.5  ! Glucose                   81 mg/dL                    36-644  ! Calcium              [L]  8.5 mg/dL                   0.3-47.4  ! BILI, Total               0.8 mg/dL                   2.5-9.5  ! AST (SGOT)                29 U/L                      3-35  ! ALT (SGPT)                19 U/L                      3-45  ! Alk Phosphatase           110 U/L                     44-160  ! Protein, Total  6.7 g/dL                    1.6-1.0  ! Albumin              [L]  3.5 g/dL                    9.6-0.4  ! GFR MDRD Af Amer          103 See Note      GFR is estimated using Creatinine, age, gender and race. Patient's values   should be interpreted as a trend.      Below 90 ml/min/1.62m2, the patient may have renal disease.       For additional information:      [email or web address] and [email or web address]    ! GFR MDRD Non Af Amer      85 See Note      GFR is estimated using Creatinine, age, gender and race. Patient's values   should be interpreted as a trend.      Between 30 and 90 ml/min/1.75m2, clinical correlation is needed.     For additional information:     [email or web address] and [email or web address]    ! 1/Creatinine              1.25    Note: An exclamation mark (!) indicates a result that was not dispersed into   the flowsheet.  Document Creation Date:  09/09/2003 10:14 PM  _______________________________________________________________________    (1) Order result status: Final  Collection or observation date-time: 09/09/2003 14:20  Requested date-time: 09/09/2003 14:20  Receipt date-time: 09/09/2003 17:57  Reported date-time: 09/09/2003 22:14  Referring Physician:    Ordering Physician: Lita Mains [facility])  Specimen Source: S&SERUM     SST REFRIG&SST (Refrig)  Source: Butler Denmark Order Number: [id number] LA01  Lab site: [facility]  [id number]

## 2003-09-09 NOTE — Unmapped (Signed)
Signed by   LinkLogic on 09/09/2003 at 22:30:01  Patient: Paula Good  Note: All result statuses are Final unless otherwise noted.    Tests: (1) LDH (LDH)    Order Note: SST2 LAV1 BLU1 UPHEMO  ! LDH                       146 U/L                     135-214    Note: An exclamation mark (!) indicates a result that was not dispersed into   the flowsheet.  Document Creation Date: 09/09/2003 10:30 PM  _______________________________________________________________________    (1) Order result status: Final  Collection or observation date-time: 09/09/2003 14:20  Requested date-time: 09/09/2003 14:20  Receipt date-time: 09/09/2003 17:57  Reported date-time: 09/09/2003 22:29  Referring Physician:    Ordering Physician: Lita Mains [facility])  Specimen Source: S&SERUM     SST REFRIG&SST (Refrig)  Source: Butler Denmark Order Number: [id number] LA01  Lab site: [facility]  [id number]

## 2003-09-09 NOTE — Unmapped (Signed)
Signed by   LinkLogic on 09/09/2003 at 22:30:01  Patient: Paula Good  Note: All result statuses are Final unless otherwise noted.    Tests: (1) IRON STUDIES (IS)    Order Note: SST2 LAV1 BLU1 UPHEMO  ! Iron                 [L]  26 ug/dL                    54-098  ! TIBC                 [H]  430 mg/dL                   119-147  ! %SAT                 [L]  6 %                         20-55    Note: An exclamation mark (!) indicates a result that was not dispersed into   the flowsheet.  Document Creation Date: 09/09/2003 10:30 PM  _______________________________________________________________________    (1) Order result status: Final  Collection or observation date-time: 09/09/2003 14:20  Requested date-time: 09/09/2003 14:20  Receipt date-time: 09/09/2003 17:57  Reported date-time: 09/09/2003 22:29  Referring Physician:    Ordering Physician: Lita Mains [facility])  Specimen Source: S&SERUM     SST REFRIG&SST (Refrig)  Source: Butler Denmark Order Number: [id number]  Lab site: [facility]  [id number]

## 2003-09-09 NOTE — Unmapped (Signed)
Signed by   LinkLogic on 09/10/2003 at 05:31:57  Patient: Paula Good  Note: All result statuses are Final unless otherwise noted.    Tests: (1) DIFFERENTIAL, MANUAL (MANDIF)    Order Note: SEND PERIPHERIAL SMEAR TO [address]    Order Note: SST2 LAV1 BLU1 UPHEMO    Order Note: Manual WBC differential performed per review criteria approved   by the medical director.  ! Platelet Estimate         Normal  ! Band                      8.0 %                       0.0-9.0  ! Neutrophil           [L]  34.0 %                      40.0-80.0  ! Lymphocyte           [H]  47.0 %                      15.0-45.0  ! Monocyte                  9.0 %                       0.0-12.0  ! Eosinophil                2.0 %                       0.0-8.0  ! Basophils                 0.0 %                       0.0-1.0  ! ABS NEUT                  1.6 10*3/uL                 1.0-8.0  ! Abs LYMPH                 1.8 10*3/uL                 0.6-3.2  ! Abs MONO                  0.3 10*3/uL                 0.0-1.0  ! Abs EOS                   0.1 10*3/uL                 0.0-0.6  ! Abs BASO                  0.0 10*3/uL                 0.0-0.1  ! Anisocytosis              Present  ! Microcytosis              Present  ! Hypochromasia             Present    Note: An exclamation  mark (!) indicates a result that was not dispersed into   the flowsheet.  Document Creation Date: 09/10/2003 5:31 AM  _______________________________________________________________________    (1) Order result status: Final  Collection or observation date-time: 09/09/2003 14:20  Requested date-time: 09/09/2003 14:20  Receipt date-time: 09/09/2003 17:57  Reported date-time: 09/10/2003 05:31  Referring Physician:    Ordering Physician: Lita Mains [facility])  Specimen Source: WB&WHOLE BLOOD     LV5  REFRIG&LAV  (Refrig)  Source: Butler Denmark Order Number: [id number]  Lab site: [facility]  [id number]

## 2003-09-09 NOTE — Unmapped (Signed)
Signed by   LinkLogic on 09/09/2003 at 21:42:36  Patient: Paula Good  Note: All result statuses are Final unless otherwise noted.    Tests: (1) VITAMIN B12 (B12)    Order Note: SST2 LAV1 BLU1 UPHEMO  ! Vitamin B12               479 pg/mL                   240-911    Note: An exclamation mark (!) indicates a result that was not dispersed into   the flowsheet.  Document Creation Date: 09/09/2003 9:42 PM  _______________________________________________________________________    (1) Order result status: Final  Collection or observation date-time: 09/09/2003 14:20  Requested date-time: 09/09/2003 14:20  Receipt date-time: 09/09/2003 17:57  Reported date-time: 09/09/2003 21:42  Referring Physician:    Ordering Physician: Lita Mains [facility])  Specimen Source: S&SERUM     RED C&RED (1 ml S Refrig)  Source: Butler Denmark Order Number: [id number]  Lab site: [facility]  [id number]

## 2003-09-09 NOTE — Unmapped (Signed)
Signed by   LinkLogic on 09/09/2003 at 18:57:38  Patient: Paula Good  Note: All result statuses are Final unless otherwise noted.    Tests: (1) PROTIME (PT)    Order Note: SST2 LAV1 BLU1 UPHEMO  ! Prothrombin Time     [H]  15.6 seconds                12.3-15.0  ! INR                  [H]  1.2                         0.9-1.1      RECOMMENDED THERAPEUTIC RANGES USING INR :    Stable Oral Anticoagulant Therapy:         2.0 - 3.0    Mechanical Prosthetic Heart Valve:         2.5 - 3.5    Recurrent Acute Myocardial Infarction:    2.5 - 3.5    Note: An exclamation mark (!) indicates a result that was not dispersed into   the flowsheet.  Document Creation Date: 09/09/2003 6:57 PM  _______________________________________________________________________    (1) Order result status: Final  Collection or observation date-time: 09/09/2003 14:20  Requested date-time: 09/09/2003 14:20  Receipt date-time: 09/09/2003 17:57  Reported date-time: 09/09/2003 18:57  Referring Physician:    Ordering Physician: Lita Mains [facility])  Specimen Source: P&PLASMA     BLU REF&BLUE (Refrig)  Source: Butler Denmark Order Number: [id number] LA01  Lab site: [facility]  [id number]

## 2003-09-09 NOTE — Unmapped (Signed)
Signed by   LinkLogic on 09/09/2003 at 20:34:59  Patient: Paula Good  Note: All result statuses are Final unless otherwise noted.    Tests: (1) RETICULOCYTE COUNT, AUTO (RET)    Order Note: SST2 LAV1 BLU1 UPHEMO  ! Reticulocyte              1.20 %                      0.50-2.00  ! Abs RETIC                 0.05 10*6/uL                0.02-0.08  ! Immature Retic Fract      15.00 %                     3.00-16.00      Effective 09/01/03, note IRF reference range change from 0.09-0.56   (previously expressed as a ratio for both female and female) to 3-16% (females)   and 2-13% (males) due to new methodology.    Note: An exclamation mark (!) indicates a result that was not dispersed into   the flowsheet.  Document Creation Date: 09/09/2003 8:34 PM  _______________________________________________________________________    (1) Order result status: Final  Collection or observation date-time: 09/09/2003 14:20  Requested date-time: 09/09/2003 14:20  Receipt date-time: 09/09/2003 17:57  Reported date-time: 09/09/2003 20:34  Referring Physician:    Ordering Physician: Lita Mains [facility])  Specimen Source: WB&WHOLE BLOOD     LV5  REFRIG&LAV  (Refrig)  Source: Butler Denmark Order Number: [id number] LA01  Lab site: [facility]  [id number]

## 2003-09-09 NOTE — Unmapped (Signed)
Signed by   LinkLogic on 09/09/2003 at 23:06:43  Patient: Paula Good  Note: All result statuses are Final unless otherwise noted.    Tests: (1) FERRITIN (FERRIT)    Order Note: SST2 LAV1 BLU1 UPHEMO  ! Ferritin             [L]  6.6 ng/mL                   10.0-291.0    Note: An exclamation mark (!) indicates a result that was not dispersed into   the flowsheet.  Document Creation Date: 09/09/2003 11:06 PM  _______________________________________________________________________    (1) Order result status: Final  Collection or observation date-time: 09/09/2003 14:20  Requested date-time: 09/09/2003 14:20  Receipt date-time: 09/09/2003 17:57  Reported date-time: 09/09/2003 23:06  Referring Physician:    Ordering Physician: Lita Mains [facility])  Specimen Source: S&SERUM     SST REFRIG&SST (Refrig)  Source: Butler Denmark Order Number: [id number] LA01  Lab site: [facility]  [id number]

## 2003-10-20 NOTE — Unmapped (Signed)
Signed by Lolita Patella. Gerrety RMA on 10/20/2003 at 12:57:54    Phone Note   Call from Patient  Call back at [phone](cell)  Caller: Patient  Call For: IM - Transplant/neff  Reason for Call: Talk to Nurse  Summary of Call: Needs to discuss appt. Could not remember name of docs she was supposed to see.    This department is not currently using the EMR system. Please see the paper chart for this patient for the response to this phone message.    Initial call taken by: Lolita Patella. Gerrety RMA,  October 20, 2003 12:57 PM

## 2003-10-22 LAB — HEPATIC FUNCTION PANEL
ALT: 23 units/L (ref 3–45)
AST: 29 units/L (ref 3–35)
Albumin: 4 g/dL (ref 3.7–5.2)
Alkaline Phosphatase: 111 units/L (ref 44–160)
Bilirubin, Direct: 0.1 mg/dL (ref 0.0–0.3)
Total Bilirubin: 0.6 mg/dL (ref 0.2–1.0)
Total Protein: 7.4 g/dL (ref 6.4–8.3)

## 2003-10-22 LAB — AFP TUMOR MARKER: Alpha-Fetoprotein: 3.1 ng/mL

## 2003-10-22 LAB — DIFFERENTIAL, MANUAL
Lymphocytes Absolute: 2.1 10*3/uL (ref 0.6–3.2)
Monocytes Absolute: 0.9 10*3/uL (ref 0.0–1.0)
Neutrophils Absolute: 4.5 10*3/uL (ref 1.0–8.0)

## 2003-10-22 LAB — CBC
Hematocrit: 35.5 % (ref 35.0–45.0)
Hemoglobin: 10.8 g/dL (ref 12.0–16.0)
MCH: 22.6 pg (ref 27.0–34.0)
MCV: 74.3 fL (ref 81.0–103.0)

## 2003-10-22 LAB — PROTIME-INR
INR: 1.1 (ref 0.9–1.1)
Protime: 14.7 s (ref 12.3–15.0)

## 2003-10-22 NOTE — Unmapped (Signed)
Signed by   LinkLogic on 10/23/2003 at 05:15:21  Patient: Laine Devincent  Note: All result statuses are Final unless otherwise noted.    Tests: (1) HEPATIC FUNCTION PANEL (LIVP)    Order Note: SST2 LAV1 BLU1    BILI, Total               0.6 mg/dL                   8.7-5.6    BILI, Direct              0.1 mg/dL                   4.3-3.2    AST (SGOT)                29 U/L                      3-35    ALT (SGPT)                23 U/L                      3-45    Alk Phosphatase           111 U/L                     44-160    Protein, Total            7.4 g/dL                    9.5-1.8    Albumin                   4.0 g/dL                    8.4-1.6    Note: An exclamation mark (!) indicates a result that was not dispersed into   the flowsheet.  Document Creation Date: 10/23/2003 5:15 AM  _______________________________________________________________________    (1) Order result status: Final  Collection or observation date-time: 10/22/2003 16:58  Requested date-time: 10/22/2003 16:58  Receipt date-time: 10/22/2003 23:22  Reported date-time: 10/23/2003 05:15  Referring Physician:    Ordering Physician: Michelle Piper Chidiebere Wynn ([facility])  Specimen Source: S&SERUM     SST REFRIG&SST (Refrig)  Source: Butler [place] Order Number: [id number] LA01  Lab site: [facility]  [id number]

## 2003-10-22 NOTE — Unmapped (Signed)
Signed by   LinkLogic on 10/23/2003 at 14:44:25  Patient: Paula Good  Note: All result statuses are Final unless otherwise noted.    Tests: (1) AFP TUMOR MARKER (A-FETO)    Order Note: SST2 LAV1 BLU1    Alpha Fetoprotein         3.1 ng/mL      AFP (Tumor Marker) Reference Ranges:   97.5% of Healthy Subjects: Less than 8.9 ng/mL.  AFP Tumor Marker results are not interpretable in pregnant women. The AFP   assay may be used as an aid in the management of nonseminatous   testicular cancer patients in conjunction with the clinical evaluation and   otherdiagnostic procedures. The AFP assay is not recommended as a   screening procedure to  detect cancer in the general population.          Note: An exclamation mark (!) indicates a result that was not dispersed into   the flowsheet.  Document Creation Date: 10/23/2003 2:44 PM  _______________________________________________________________________    (1) Order result status: Final  Collection or observation date-time: 10/22/2003 16:58  Requested date-time: 10/22/2003 16:58  Receipt date-time: 10/22/2003 23:22  Reported date-time: 10/23/2003 14:44  Referring Physician:    Ordering Physician: Michelle Piper Laxmi Choung ([facility])  Specimen Source: S&SERUM     SST10 REFRIG&SST (Refrig 10ml)  Source: Butler Denmark Order Number: [id number] LA01  Lab site: [facility]  [id number]

## 2003-10-22 NOTE — Unmapped (Signed)
Signed by   LinkLogic on 10/23/2003 at 01:52:56  Patient: Paula Good  Note: All result statuses are Final unless otherwise noted.    Tests: (1) CBC (CBC)    Order Note: SST2 LAV1 BLU1  ! WBC                       7.6 10*3/uL                 4.5-11.0  ! RBC                       4.78 10*6/uL                3.90-5.40    Hgb                  [L]  10.8 g/dL                   73.2-20.2    HCT                       35.5 %                      35.0-45.0    MCV                  [LL] 74.3 fL                     81.0-103.0      Effective 09/01/2003, note MCV reference range change from 82-98 fl to   81-103 fl due to new methodology.    MCH                  [L]  22.6 pg                     27.0-34.0  ! MCHC                      30.4 g/dL                   54.2-70.6      Effective 09/01/2003, note MCHC reference range change from 32-36 g/dl to   23-76 g/dl due to new methodology.  ! RDW                  [H]  21.2 %                      11.5-14.5  ! Platelet Count            217 10*3/uL                 140-400  ! MPV                       9.8 fL                      9.5-12.5      Effective 09/01/2003, note MPV reference range change from 7.5-10.5 fl to   9.5-12.5 fl due to new methodology.    Note: An exclamation mark (!) indicates a result that was not dispersed into   the flowsheet.  Document Creation Date: 10/23/2003 1:52 AM  _______________________________________________________________________    (1) Order result status: Final  Collection or observation date-time: 10/22/2003 16:58  Requested date-time: 10/22/2003 16:58  Receipt date-time: 10/22/2003 23:22  Reported date-time: 10/23/2003 01:52  Referring Physician:    Ordering Physician: Michelle Piper Uchenna Seufert ([facility])  Specimen Source: WB&WHOLE BLOOD     LV5  REFRIG&LAV  (Refrig)  Source: Butler Denmark Order Number: [id number] LA01  Lab site: [facility]  [id number]

## 2003-10-22 NOTE — Unmapped (Signed)
Signed by   LinkLogic on 10/23/2003 at 00:43:41  Patient: Paula Good  Note: All result statuses are Final unless otherwise noted.    Tests: (1) PROTIME (PT)    Order Note: SST2 LAV1 BLU1    Prothrombin Time          14.7 seconds                12.3-15.0    INR                       1.1                         0.9-1.1      RECOMMENDED THERAPEUTIC RANGES USING INR :    Stable Oral Anticoagulant Therapy:         2.0 - 3.0    Mechanical Prosthetic Heart Valve:         2.5 - 3.5    Recurrent Acute Myocardial Infarction:    2.5 - 3.5    Note: An exclamation mark (!) indicates a result that was not dispersed into   the flowsheet.  Document Creation Date: 10/23/2003 12:43 AM  _______________________________________________________________________    (1) Order result status: Final  Collection or observation date-time: 10/22/2003 16:58  Requested date-time: 10/22/2003 16:58  Receipt date-time: 10/22/2003 23:22  Reported date-time: 10/23/2003 00:43  Referring Physician:    Ordering Physician: Michelle Piper Charleston Hankin ([facility])  Specimen Source: P&PLASMA     BLU REF&BLUE (Refrig)  Source: Butler Denmark Order Number: [id number] LA01  Lab site: [facility]  [id number]

## 2003-10-22 NOTE — Unmapped (Signed)
Signed by   LinkLogic on 10/23/2003 at 01:52:55  Patient: Paula Good  Note: All result statuses are Final unless otherwise noted.    Tests: (1) DIFFERENTIAL (DIFF)    Order Note: SST2 LAV1 BLU1  ! Neutrophil                59.0 %                      40.0-80.0  ! Lymphocyte                28.0 %                      15.0-45.0  ! Monocyte                  11.8 %                      0.0-12.0  ! Eosinophil                0.9 %                       0.0-8.0  ! Basophils                 0.3 %                       0.0-1.0    ABS NEUT                  4.5 10*3/uL                 1.0-8.0    Abs LYMPH                 2.1 10*3/uL                 0.6-3.2    Abs MONO                  0.9 10*3/uL                 0.0-1.0  ! Abs EOS                   0.1 10*3/uL                 0.0-0.6  ! Abs BASO                  0.0 10*3/uL                 0.0-0.1    Note: An exclamation mark (!) indicates a result that was not dispersed into   the flowsheet.  Document Creation Date: 10/23/2003 1:52 AM  _______________________________________________________________________    (1) Order result status: Final  Collection or observation date-time: 10/22/2003 16:58  Requested date-time: 10/22/2003 16:58  Receipt date-time: 10/22/2003 23:22  Reported date-time: 10/23/2003 01:52  Referring Physician:    Ordering Physician: Michelle Piper Latrelle Bazar ([facility])  Specimen Source: WB&WHOLE BLOOD     LV5  REFRIG&LAV  (Refrig)  Source: Butler [place] Order Number: [id number] LA01  Lab site: [facility]  [id number]

## 2003-10-23 LAB — URINALYSIS W/RFL TO MICROSCOPIC
Bilirubin Urine: NEGATIVE
Ketones, UA: NEGATIVE
Leukocytes, UA: NEGATIVE
Nitrite, UA: NEGATIVE
Specific Gravity, UA: 1.02 (ref 1.005–1.035)
Urobilinogen, UA: 0.2 E units/dL (ref 0.2–1.0)
pH, UA: 6.5 (ref 5.0–8.0)

## 2003-10-23 LAB — RENAL FUNCTION PANEL W/EGFR
Albumin: 4 g/dL (ref 3.7–5.2)
BUN: 10 mg/dL (ref 7–21)
CO2: 26 mmol/L (ref 19–32)
Chloride: 100 mmol/L (ref 100–110)
Creatinine: 0.9 mg/dL (ref 0.6–1.2)
Glucose: 97 mg/dL (ref 70–105)
Phosphorus: 3.5 mg/dL (ref 2.5–4.5)
Potassium: 3.9 mmol/L (ref 3.5–5.0)
Sodium: 135 mmol/L — ABNORMAL LOW (ref 136–146)

## 2003-10-23 LAB — CBC
Hematocrit: 35.7 % (ref 35.0–45.0)
Hemoglobin: 10.8 g/dL (ref 12.0–16.0)
MCH: 22.3 pg (ref 27.0–34.0)
MCV: 73.6 fL (ref 81.0–103.0)

## 2003-10-23 LAB — HEPATIC FUNCTION PANEL
ALT: 22 units/L (ref 3–45)
AST: 31 units/L (ref 3–35)
Albumin: 4 g/dL (ref 3.7–5.2)
Alkaline Phosphatase: 108 units/L (ref 44–160)
Bilirubin, Direct: 0.2 mg/dL (ref 0.0–0.3)
Total Bilirubin: 0.9 mg/dL (ref 0.2–1.0)
Total Protein: 7.3 g/dL (ref 6.4–8.3)

## 2003-10-23 LAB — DIFFERENTIAL, MANUAL
Lymphocytes Absolute: 1.3 10*3/uL (ref 0.6–3.2)
Monocytes Absolute: 0.6 10*3/uL (ref 0.0–1.0)
Neutrophils Absolute: 2.9 10*3/uL (ref 1.0–8.0)

## 2003-10-23 LAB — PROTIME-INR
INR: 1.1 (ref 0.9–1.1)
Protime: 15 s (ref 12.3–15.0)

## 2003-10-23 NOTE — Unmapped (Signed)
Signed by   LinkLogic on 10/23/2003 at 16:38:10  Patient: Paula Good  Note: All result statuses are Final unless otherwise noted.    Tests: (1) DIAG-CHEST PA & LATERAL [phone])    Order NotePricilla Handler Order Number: [id number]    Order Note:     *** VERIFIED ***  [facility]  Reason:  LIVER DISEASE, PRE-OP  Dict.Staff: Yvonna Alanis    Verified By: Yvonna Alanis           Ver: 10/23/03   4:26 pm  Exams:  DIAG-CHEST PA & LATERAL      PA AND LATERAL CHEST PERFORMED ON 10/23/2003:    INDICATION: Liver disease, preop.    COMPARISON: None.    FINDINGS:    There are surgical clips in the right upper quadrant.  There is  an old healed rib fracture deformity in the posterior right  sixth rib.  The lungs are clear.  There are no pleural  effusions.  The cardiomediastinal silhouette is normal.    IMPRESSION:    ESSENTIALLY NORMAL.    /jsr:jb  **** end of result ****    Note: An exclamation Jodene Polyak (!) indicates a result that was not dispersed into   the flowsheet.  Document Creation Date: 10/23/2003 4:38 PM  _______________________________________________________________________    (1) Order result status: Final  Collection or observation date-time: 10/23/2003 11:15:00  Requested date-time: 10/23/2003 11:15:00  Receipt date-time:   Reported date-time: 10/23/2003 16:26:29  Referring Physician: Kem Kays  Ordering Physician: Kem Kays [facility])  Specimen Source:   Source: QRS  Filler Order Number: [id number]  Lab site: [facility]

## 2003-10-23 NOTE — Unmapped (Signed)
Signed by   LinkLogic on 10/23/2003 at 19:00:25  Patient: Paula Good  Note: All result statuses are Final unless otherwise noted.    Tests: (1) DIFFERENTIAL (DIFF)  ! Neutrophil                58.6 %                      40.0-80.0  ! Lymphocyte                26.5 %                      15.0-45.0  ! Monocyte             [H]  12.3 %                      0.0-12.0  ! Eosinophil                2.2 %                       0.0-8.0  ! Basophils                 0.4 %                       0.0-1.0    ABS NEUT                  2.9 10*3/uL                 1.0-8.0    Abs LYMPH                 1.3 10*3/uL                 0.6-3.2    Abs MONO                  0.6 10*3/uL                 0.0-1.0  ! Abs EOS                   0.1 10*3/uL                 0.0-0.6  ! Abs BASO                  0.0 10*3/uL                 0.0-0.1    Note: An exclamation Rosetta Rupnow (!) indicates a result that was not dispersed into   the flowsheet.  Document Creation Date: 10/23/2003 7:00 PM  _______________________________________________________________________    (1) Order result status: Final  Collection or observation date-time: 10/23/2003 10:37  Requested date-time: 10/23/2003 10:37  Receipt date-time: 10/23/2003 14:12  Reported date-time: 10/23/2003 19:00  Referring Physician:    Ordering Physician: Kem Kays [facility])  Specimen Source: WB&WHOLE BLOOD     LV5  REFRIG&LAV  (Refrig)  Source: Butler Denmark Order Number: [id number] LA01  Lab site: [facility]  [id number]

## 2003-10-23 NOTE — Unmapped (Signed)
Signed by   LinkLogic on 10/23/2003 at 18:17:35  Patient: Paula Good  Note: All result statuses are Final unless otherwise noted.    Tests: (1) HEPATIC FUNCTION PANEL (LIVP)    BILI, Total               0.9 mg/dL                   1.6-1.0    BILI, Direct              0.2 mg/dL                   9.6-0.4    AST (SGOT)                31 U/L                      3-35    ALT (SGPT)                22 U/L                      3-45    Alk Phosphatase           108 U/L                     44-160    Protein, Total            7.3 g/dL                    5.4-0.9    Albumin                   4.0 g/dL                    8.1-1.9    Note: An exclamation Iasha Mccalister (!) indicates a result that was not dispersed into   the flowsheet.  Document Creation Date: 10/23/2003 6:17 PM  _______________________________________________________________________    (1) Order result status: Final  Collection or observation date-time: 10/23/2003 10:37  Requested date-time: 10/23/2003 10:37  Receipt date-time: 10/23/2003 13:51  Reported date-time: 10/23/2003 18:06  Referring Physician:    Ordering Physician: Kem Kays [facility])  Specimen Source: S&SERUM     SST REFRIG&SST (Refrig)  Source: Butler Denmark Order Number: [id number] LA01  Lab site: [facility]  [id number]

## 2003-10-23 NOTE — Unmapped (Signed)
Signed by   LinkLogic on 10/23/2003 at 19:00:01  Patient: Paula Good  Note: All result statuses are Final unless otherwise noted.    Tests: (1) CBC (CBC)  ! WBC                       5.0 10*3/uL                 4.5-11.0  ! RBC                       4.85 10*6/uL                3.90-5.40    Hgb                  [L]  10.8 g/dL                   76.1-60.7    HCT                       35.7 %                      35.0-45.0    MCV                  [LL] 73.6 fL                     81.0-103.0      Effective 09/01/2003, note MCV reference range change from 82-98 fl to   81-103 fl due to new methodology.    MCH                  [L]  22.3 pg                     27.0-34.0  ! MCHC                      30.3 g/dL                   37.1-06.2      Effective 09/01/2003, note MCHC reference range change from 32-36 g/dl to   69-48 g/dl due to new methodology.  ! RDW                  [H]  20.7 %                      11.5-14.5  ! Platelet Count            220 10*3/uL                 140-400  ! MPV                       10.3 fL                     9.5-12.5      Effective 09/01/2003, note MPV reference range change from 7.5-10.5 fl to   9.5-12.5 fl due to new methodology.    Note: An exclamation Hether Anselmo (!) indicates a result that was not dispersed into   the flowsheet.  Document Creation Date: 10/23/2003 7:00 PM  _______________________________________________________________________    (1) Order result status: Final  Collection or observation date-time: 10/23/2003 10:37  Requested date-time: 10/23/2003 10:37  Receipt date-time: 10/23/2003  14:12  Reported date-time: 10/23/2003 18:59  Referring Physician:    Ordering Physician: Kem Kays [facility])  Specimen Source: WB&WHOLE BLOOD     LV5  REFRIG&LAV  (Refrig)  Source: Butler Denmark Order Number: [id number] LA01  Lab site: [facility]  [id number]

## 2003-10-23 NOTE — Unmapped (Signed)
Signed by   LinkLogic on 10/23/2003 at 18:17:36  Patient: Paula Good  Note: All result statuses are Final unless otherwise noted.    Tests: (1) RENAL FUNCTION PANEL (KIDNEY)    Sodium               [L]  135 mEq/L                   136-146    Potassium                 3.9 mEq/L                   3.5-5.0    Chloride                  100 mEq/L                   100-110    CO2                       26 mEq/L                    19-32  ! Anion Gap                 9 mEq/L                     3-16    BUN                       10 mg/dL                    6-57    Creatinine                0.9 mg/dL                   8.4-6.9    Glucose                   97 mg/dL                    62-952  ! Calcium                   9.0 mg/dL                   8.4-13.2    Phosphorus                3.5 mg/dL                   4.4-0.1    Albumin                   4.0 g/dL                    0.2-7.2  ! GFR MDRD Af Amer          90 See Note      GFR is estimated using Creatinine, age, gender and race. Patient's values   should be interpreted as a trend.      Between 30 and 90 ml/min/1.24m2, clinical correlation is needed.     For additional information:     www.kidney.org and [email or web address]    ! GFR MDRD Non Af Amer      74 See Note      GFR is estimated  using Creatinine, age, gender and race. Patient's values   should be interpreted as a trend.      Between 30 and 90 ml/min/1.29m2, clinical correlation is needed.     For additional information:     www.[email or web address].org and [email or web address]    ! 1/Creatinine              1.11    Note: An exclamation Mykaila Blunck (!) indicates a result that was not dispersed into   the flowsheet.  Document Creation Date: 10/23/2003 6:17 PM  _______________________________________________________________________    (1) Order result status: Final  Collection or observation date-time: 10/23/2003 10:37  Requested date-time: 10/23/2003 10:37  Receipt date-time: 10/23/2003 13:51  Reported date-time: 10/23/2003 18:06  Referring  Physician:    Ordering Physician: Kem Kays [facility])  Specimen Source: S&SERUM     SST REFRIG&SST (Refrig)  Source: Butler [place] Order Number: [id number]  Lab site: [facility]  [id number]

## 2003-10-23 NOTE — Unmapped (Signed)
Signed by   LinkLogic on 10/24/2003 at 14:01:37  Patient: Paula Good  Note: All result statuses are Final unless otherwise noted.    Tests: (1) URINE CULTURE (UC)  ! Clinical Report           Result Below...        RESULT: Specimen: URINE  Collected: 10/23/2003 10:37  (1) Gram Stn = Lab Protocol; Sensitivites = Lab Protocol     Status: Final     Last Update: 10/24/2003 14:01          Culture Result: (Final)      >100,000 cfu/mL      Alpha hemolytic Streptococcus, not enterococcus    Note: An exclamation Brentlee Delage (!) indicates a result that was not dispersed into   the flowsheet.  Document Creation Date: 10/24/2003 2:01 PM  _______________________________________________________________________    (1) Order result status: Final  Collection or observation date-time: 10/23/2003 10:37  Requested date-time: 10/23/2003 10:37  Receipt date-time: 10/23/2003 14:23  Reported date-time: 10/24/2003 14:01  Referring Physician:    Ordering Physician: Kem Kays [facility])  Specimen Source: U&URINE     UGRS&Boric Acid, Small Urine Tube  Source: Butler [place] Order Number: [id number]  Lab site: [facility]  [id number]

## 2003-10-23 NOTE — Unmapped (Signed)
Signed by   LinkLogic on 10/23/2003 at 19:12:23  Patient: Paula Good  Note: All result statuses are Final unless otherwise noted.    Tests: (1) PROTIME (PT)    Prothrombin Time          15.0 seconds                12.3-15.0    INR                       1.1                         0.9-1.1      RECOMMENDED THERAPEUTIC RANGES USING INR :    Stable Oral Anticoagulant Therapy:         2.0 - 3.0    Mechanical Prosthetic Heart Valve:         2.5 - 3.5    Recurrent Acute Myocardial Infarction:    2.5 - 3.5    Note: An exclamation Ayo Guarino (!) indicates a result that was not dispersed into   the flowsheet.  Document Creation Date: 10/23/2003 7:12 PM  _______________________________________________________________________    (1) Order result status: Final  Collection or observation date-time: 10/23/2003 10:37  Requested date-time: 10/23/2003 10:37  Receipt date-time: 10/23/2003 14:12  Reported date-time: 10/23/2003 19:12  Referring Physician:    Ordering Physician: Kem Kays [facility])  Specimen Source: P&PLASMA     BLU REF&BLUE (Refrig)  Source: Butler [place] Order Number: [id number]  Lab site: [facility]  [id number]

## 2003-10-23 NOTE — Unmapped (Signed)
Signed by   LinkLogic on 10/23/2003 at 18:17:55  Patient: Paula Good  Note: All result statuses are Final unless otherwise noted.    Tests: (1) URINALYSIS (UA)    Order Note: Microscopic testing is not performed when the dipstick is   negative for blood, leukocyte, protein and nitrite.    Color                     Yellow                      Yellow,Straw  ! Clarity                   Clear                       Clear    Specific Gravity          1.020                       1.005-1.035    pH                        6.5                         5.0-8.0  ! Protein, urine            Negative mg/dL              Negative  ! Glucose, Urine            Negative mg/dL              Negative    Ketone                    Negative mg/dL              Negative    Bilirubin, Urine          Negative                    Negative  ! Blood                     Negative                    Negative    Nitrite                   Negative                    Negative    Urobilinogen              0.2 EU/dL                   6.5-7.8    Leukocyte Esterase        Negative                    Negative    Note: An exclamation Jasimine Simms (!) indicates a result that was not dispersed into   the flowsheet.  Document Creation Date: 10/23/2003 6:17 PM  _______________________________________________________________________    (1) Order result status: Final  Collection or observation date-time: 10/23/2003 10:37  Requested date-time: 10/23/2003 10:37  Receipt date-time: 10/23/2003 14:12  Reported date-time: 10/23/2003 18:09  Referring Physician:    Ordering Physician: Kem Kays [facility])  Specimen Source: U&URINE     UGRLR&Boric Acid,Large Urine Tube Refrig.  Source: Butler [place] Order Number: [id number] LA01  Lab site: [facility]      [place]  [address]

## 2003-10-27 NOTE — Unmapped (Signed)
Signed by Lolita Patella. Gerrety RMA on 10/27/2003 at 14:26:11    Phone Note   Call from Patient  Call back at [phone]  Caller: Patient  Call For: IM - Transplant/neff  Reason for Call: Lab or Test Results    This department is not currently using the EMR system. Please see the paper chart for this patient for the response to this phone message.    Initial call taken by: Lolita Patella. Gerrety RMA,  October 27, 2003 2:25 PM

## 2003-10-30 NOTE — Unmapped (Signed)
Signed by   LinkLogic on 11/27/2003 at 10:46:46  Patient: Paula Good  Note: All result statuses are Final unless otherwise noted.    Tests: (1)  (MR)    Order Note:                                  [facility]     PATIENT NAME:         Paula, Good               MR #:  [id number]  DATE OF BIRTH:        12-27-1965                      ACCOUNT #:  [id number]  SURGEON:              Roseanna Rainbow. Maisie Fus, M.D.            ROOM #:  SDS  SERVICE:              General Surgery                 NURSING UNIT:  USD  DICTATED BY:          Roseanna Rainbow. Maisie Fus, M.D.            [facility]:  C  SURGERY DATE:         10/30/2003                      DISCHARGE DATE:                                        OPERATIVE REPORT        PREOPERATIVE DIAGNOSIS(ES):       1.        Rheumatoid arthritis.   2.        Immunosuppressed.    3.        Unknown hepatitis.       POSTOPERATIVE DIAGNOSIS(ES):       1.        Rheumatoid arthritis.   2.        Immunosuppressed.    3.        Unknown hepatitis.    4.        Cirrhosis.      PROCEDURE(S) PERFORMED:       1.        Laparoscopic exploration.  2.        Laparoscopic ultrasound evaluation of the liver.   3.        Laparoscopic liver biopsies x three.      SURGEON:   Roseanna Rainbow. Maisie Fus, M.D.      ANESTHESIA:    General endotracheal tube anesthesia.       COMPLICATIONS:   None.      ESTIMATED BLOOD LOSS:   0.       INTRAOPERATIVE FINDING(S):      1.        Micronodular cirrhosis grossly.    2.        Needle biopsy frozen pathology shows cirrhosis with moderate   activity.  3.        No evidence of cancer.      INTRAOPERATIVE ULTRASOUND RESULTS:     1.  No mass lesions, liver homogeneous with cirrhosis.       INDICATION FOR PROCEDURE:    The patient is a 38 year old female who has  rheumatoid arthritis and has been treated with Naprosyn and methotrexate  for several years.  The patient has elevated LFTs without definitive  diagnosis.  The diagnostic paradigm requires a liver biopsy,  however,  percutaneous liver biopsy has been deemed to be too dangerous considering  the patients immunosuppression and risk for bleeding.  After discussing  the options, the risks, benefits and complications with the patient, she  signed the informed consent and was taken to the operating room.       DETAILS OF PROCEDURE:   The patient was placed supine on the operating  table.  After adequate IV access and IV sedation, the patient was intubated  and anesthetized.  The abdomen was prepped with Betadine solution and  draped with sterile linen and sterile drapes.      A 12 mm paramedian right laparoscopic port was placed by the Hasson  technique and the abdomen was inflated to 15 cm of water with C02 gas.  The  patient had some low blood pressure with insufflation and the pressure was  decreased to 12.  The laparoscope was placed intraabdominally and found to  have only a few adhesions from her previous cholecystectomy to the anterior  abdominal wall.  A 5 mm midline port was placed under direct vision within  the scar of her laparoscopic cholecystectomy incisions.  The liver was  visually surveyed, as well as the rest of the peritoneal surfaces and the  abdomen.  The liver was then evaluated by intraoperative high sensitivity  ultrasound, both right and left lobes completely, on the anterior and  inferior surfaces.  No mass lesions were noted.  Biopsies of the right lobe  and left lobe were taken by 18 gauge needle Biopty gun under direct vision  and the Argon beam coagulator was utilized for hemostasis.  Intraoperative  frozen pathology showed cirrhosis with moderate activity, no evidence of  cancer.  Specimens from the right and left were also sent for permanent  pathology.      The abdomen was desufflated.  The 12 mm port was closed at the fascial  level with an 0 Vicryl figure-of-eight suture.  The skin incisions were  reapproximated with 4-0 subcuticular Vicryl and with tissue glue.       The patient tolerated  the procedure well and was taken to the recovery room  in awake and stable condition.       TOTAL TIME SPENT WITH PATIENT:                                                                                                   MJT/rah  D:  10/30/2003  T:  10/30/2003     c:  Anesthesia         Roseanna Rainbow. Maisie Fus, M.D.         Kristeen Mans, M.D.         Bary Leriche, M.D.  Lita Mains, M.D.       Note: An exclamation mark (!) indicates a result that was not dispersed into   the flowsheet.  Document Creation Date: 11/27/2003 10:46 AM  _______________________________________________________________________    (1) Order result status: Final  Collection or observation date-time: 10/30/2003 00:00  Requested date-time:   Receipt date-time:   Reported date-time:   Referring Physician:    Ordering Physician:  [facility]ed [facility])  Specimen Source:   Source: DBS  Filler Order Number: [id number]  Lab site:

## 2003-11-10 NOTE — Unmapped (Signed)
Signed by Marlowe Alt on 11/10/2003 at 14:17:17    Phone Note   Call from Patient  Caller: Patient  Call For: IM -Gastroenterology NEFF  Reason for Call: Talk to Nurse  Summary of Call: [phone] NEEDS TO SEE DR ASAP SHE JUST HAD A BIOPSY    This department is not currently using the EMR system. Please see the paper chart for this patient for the response to this phone message.    Initial call taken by: Marlowe Alt,  November 10, 2003 2:17 PM

## 2003-11-12 NOTE — Unmapped (Signed)
Signed by   LinkLogic on 11/21/2003 at 08:42:57  Patient: Paula Good  Note: All result statuses are Final unless otherwise noted.    Tests: (1)  (MR)    Order Note:                                  [facility]     PATIENT NAME:         Paula Good, Paula Good               MR #:  [id number]  DATE OF BIRTH:        1965/03/30                      ACCOUNT #:  [id number]  ADMITTING:                                            ROOM #:    ATTENDING:                                            NURSING UNIT:    SERVICE:                                              FC:  S  DICTATED BY:          Geri Seminole, M.D.             DISCHARGE DATE:    ADMIT DATE:           08/19/2003                                     DISCHARGE SUMMARY        NO DICTATION                                                                                                     LM/bw  D:  11/12/2003  T:  11/18/2003       Note: An exclamation mark (!) indicates a result that was not dispersed into   the flowsheet.  Document Creation Date: 11/21/2003 8:42 AM  _______________________________________________________________________    (1) Order result status: Final  Collection or observation date-time: 11/12/2003 00:00  Requested date-time:   Receipt date-time:   Reported date-time:   Referring Physician:    Ordering Physician:  Reviewed In [facility])  Specimen Source:   Source: DBS  Filler Order Number: [id number]  Lab site:

## 2003-12-09 LAB — AFP TUMOR MARKER: Alpha-Fetoprotein: 3.4 ng/mL

## 2003-12-09 LAB — HEPATIC FUNCTION PANEL
ALT: 19 units/L (ref 3–45)
AST: 24 units/L (ref 3–35)
Albumin: 3.8 g/dL (ref 3.7–5.2)
Alkaline Phosphatase: 100 units/L (ref 44–160)
Bilirubin, Direct: 0.1 mg/dL (ref 0.0–0.3)
Total Bilirubin: 0.5 mg/dL (ref 0.2–1.0)
Total Protein: 7.1 g/dL (ref 6.2–8.3)

## 2003-12-09 LAB — DIFFERENTIAL, MANUAL
Lymphocytes Absolute: 1.6 10*3/uL (ref 0.6–3.2)
Monocytes Absolute: 0.5 10*3/uL (ref 0.0–1.0)
Neutrophils Absolute: 2 10*3/uL (ref 1.0–8.0)

## 2003-12-09 LAB — CBC
Hematocrit: 32.3 % (ref 35.0–45.0)
Hemoglobin: 9.7 g/dL (ref 12.0–16.0)
MCH: 22.4 pg (ref 27.0–34.0)
WBC: 4.3 (ref 4.5–11.0)

## 2003-12-09 NOTE — Unmapped (Signed)
Signed by   LinkLogic on 12/10/2003 at 01:03:12  Patient: Paula Good  Note: All result statuses are Final unless otherwise noted.    Tests: (1) CBC (CBC)    WBC                  [L]  4.3 10*3/uL                 4.5-11.0  ! RBC                       4.33 10*6/uL                3.90-5.40    Hgb                  [L]  9.7 g/dL                    95.6-21.3    HCT                  [L]  32.3 %                      35.0-45.0  ! MCV                  [LL] 74.6 fL                     81.0-103.0    MCH                  [L]  22.4 pg                     27.0-34.0  ! MCHC                      30.0 g/dL                   08.6-57.8  ! RDW                  [H]  17.8 %                      11.5-14.5  ! Platelet Count            207 10*3/uL                 140-400  ! MPV                       10.2 fL                     9.5-12.5    Note: An exclamation mark (!) indicates a result that was not dispersed into   the flowsheet.  Document Creation Date: 12/10/2003 1:03 AM  _______________________________________________________________________    (1) Order result status: Final  Collection or observation date-time: 12/09/2003 15:51  Requested date-time: 12/09/2003 15:51  Receipt date-time: 12/09/2003 23:42  Reported date-time: 12/10/2003 01:03  Referring Physician:    Ordering Physician: Michelle Piper Josph Norfleet ([facility])  Specimen Source: WB&WHOLE BLOOD     LV5  REFRIG&LAV  (Refrig)  Source: Butler [place] Order Number: [id number] LA01  Lab site: [facility]  [id number]

## 2003-12-09 NOTE — Unmapped (Signed)
Signed by   LinkLogic on 12/11/2003 at 20:23:58  Patient: Jia Murata  Note: All result statuses are Final unless otherwise noted.    Tests: (1) ANTI-SMOOTH MUSCLE AB (ASMA) (ASMA)  ! Anti-Smth Muscle AB       NEGATIVE                    NEGATIVE      Testing performed by [facility]., [address]  [place], [place]., [address]    Note: An exclamation mark (!) indicates a result that was not dispersed into   the flowsheet.  Document Creation Date: 12/11/2003 8:23 PM  _______________________________________________________________________    (1) Order result status: Final  Collection or observation date-time: 12/09/2003 15:51  Requested date-time: 12/09/2003 15:51  Receipt date-time: 12/11/2003 20:23  Reported date-time: 12/11/2003 20:23  Referring Physician:    Ordering Physician: Michelle Piper Deann Mclaine ([facility])  Specimen Source: S&SERUM     SST REFRIG&SST (Refrig)  Source: Butler [place] Order Number: [id number] LA01  Lab site: [facility]  [id number]

## 2003-12-09 NOTE — Unmapped (Signed)
Signed by   LinkLogic on 12/10/2003 at 04:15:47  Patient: Annice Cusic  Note: All result statuses are Final unless otherwise noted.    Tests: (1) HEPATIC FUNCTION PANEL (LIVP)    BILI, Total               0.5 mg/dL                   6.5-7.8    BILI, Direct              0.1 mg/dL                   4.6-9.6    AST (SGOT)                24 U/L                      3-35    ALT (SGPT)                19 U/L                      3-45    Alk Phosphatase           100 U/L                     44-160    Protein, Total            7.1 g/dL                    2.9-5.2    Albumin                   3.8 g/dL                    8.4-1.3    Note: An exclamation mark (!) indicates a result that was not dispersed into   the flowsheet.  Document Creation Date: 12/10/2003 4:15 AM  _______________________________________________________________________    (1) Order result status: Final  Collection or observation date-time: 12/09/2003 15:51  Requested date-time: 12/09/2003 15:51  Receipt date-time: 12/09/2003 23:42  Reported date-time: 12/10/2003 04:15  Referring Physician:    Ordering Physician: Michelle Piper Dakwan Pridgen ([facility])  Specimen Source: S&SERUM     SST REFRIG&SST (Refrig)  Source: Butler [place] Order Number: [id number] LA01  Lab site: [facility]  [id number]

## 2003-12-09 NOTE — Unmapped (Signed)
Signed by   LinkLogic on 12/10/2003 at 20:26:52  Patient: Paula Good  Note: All result statuses are Final unless otherwise noted.    Tests: (1) ANA (ANA)  ! ANA screen by EIA         NEGATIVE                    NEGATIVE      Testing performed by [facility]., [address]  [place], [place]., [address]    Note: An exclamation mark (!) indicates a result that was not dispersed into   the flowsheet.  Document Creation Date: 12/10/2003 8:26 PM  _______________________________________________________________________    (1) Order result status: Final  Collection or observation date-time: 12/09/2003 15:51  Requested date-time: 12/09/2003 15:51  Receipt date-time: 12/10/2003 20:26  Reported date-time: 12/10/2003 20:26  Referring Physician:    Ordering Physician: Michelle Piper Malek Skog ([facility])  Specimen Source: S&SERUM     SST REFRIG&SST (Refrig)  Source: Butler [place] Order Number: [id number] LA01  Lab site: [facility]  [id number]

## 2003-12-09 NOTE — Unmapped (Signed)
Signed by   LinkLogic on 12/10/2003 at 17:02:20  Patient: Paula Good  Note: All result statuses are Final unless otherwise noted.    Tests: (1) AFP TUMOR MARKER (A-FETO)    Alpha Fetoprotein         3.4 ng/mL      AFP (Tumor Marker) Reference Ranges:   97.5% of Healthy Subjects: Less than 8.9 ng/mL.  AFP Tumor Marker results are not interpretable in pregnant women. The AFP   assay may be used as an aid in the management of nonseminatous   testicular cancer patients in conjunction with the clinical evaluation and   otherdiagnostic procedures. The AFP assay is not recommended as a   screening procedure to  detect cancer in the general population.          Note: An exclamation mark (!) indicates a result that was not dispersed into   the flowsheet.  Document Creation Date: 12/10/2003 5:02 PM  _______________________________________________________________________    (1) Order result status: Final  Collection or observation date-time: 12/09/2003 15:51  Requested date-time: 12/09/2003 15:51  Receipt date-time: 12/09/2003 23:42  Reported date-time: 12/10/2003 17:02  Referring Physician:    Ordering Physician: Michelle Piper Yoana Staib ([facility])  Specimen Source: S&SERUM     SST10 REFRIG&SST (Refrig 10ml)  Source: Butler [place] Order Number: [id number] LA01  Lab site: [facility]  [id number]

## 2003-12-09 NOTE — Unmapped (Signed)
Signed by   LinkLogic on 12/10/2003 at 00:32:40  Patient: Paula Good  Note: All result statuses are Final unless otherwise noted.    Tests: (1) PROTIME (PT)  ! Prothrombin Time          14.4 seconds                12.3-15.0  ! INR                       1.1                         0.9-1.1      RECOMMENDED THERAPEUTIC RANGES USING INR :    Stable Oral Anticoagulant Therapy:         2.0 - 3.0    Mechanical Prosthetic Heart Valve:         2.5 - 3.5    Recurrent Acute Myocardial Infarction:    2.5 - 3.5    Note: An exclamation mark (!) indicates a result that was not dispersed into   the flowsheet.  Document Creation Date: 12/10/2003 12:32 AM  _______________________________________________________________________    (1) Order result status: Final  Collection or observation date-time: 12/09/2003 15:51  Requested date-time: 12/09/2003 15:51  Receipt date-time: 12/09/2003 23:42  Reported date-time: 12/10/2003 00:32  Referring Physician:    Ordering Physician: Michelle Piper Aiken Withem ([facility])  Specimen Source: P&PLASMA     BLU REF&BLUE (Refrig)  Source: Butler [place] Order Number: [id number] LA01  Lab site: [facility]  [id number]

## 2003-12-09 NOTE — Unmapped (Signed)
Signed by   LinkLogic on 12/15/2003 at 17:23:02  Patient: Paula Good  Note: All result statuses are Final unless otherwise noted.    Tests: (1) ANTI-MITOCHONDRIAL ANTIBODY (AMA) (AMA)  ! Anti-Mitochon AB          0.12                                                 NEGATIVE:      < 0.95                                                 BORDERLINE:    0.95 - 1.00                                                 POSITIVE:      >1.00      Testing performed by [facility]., [address]  [place], [place]., [address]    Note: An exclamation mark (!) indicates a result that was not dispersed into   the flowsheet.  Document Creation Date: 12/15/2003 5:23 PM  _______________________________________________________________________    (1) Order result status: Final  Collection or observation date-time: 12/09/2003 15:51  Requested date-time: 12/09/2003 15:51  Receipt date-time: 12/15/2003 17:22  Reported date-time: 12/15/2003 17:22  Referring Physician:    Ordering Physician: Michelle Piper Everrett Lacasse ([facility])  Specimen Source: S&SERUM     SST REFRIG&SST (Refrig)  Source: Butler [place] Order Number: [id number] LA01  Lab site: [facility]  [id number]

## 2003-12-09 NOTE — Unmapped (Signed)
Signed by   LinkLogic on 12/10/2003 at 18:58:38  Patient: Paula Good  Note: All result statuses are Final unless otherwise noted.    Tests: (1) AMMONIA (AMON)  ! Ammonia                   43 ug/dL                    16-10    Note: An exclamation mark (!) indicates a result that was not dispersed into   the flowsheet.  Document Creation Date: 12/10/2003 6:58 PM  _______________________________________________________________________    (1) Order result status: Final  Collection or observation date-time: 12/09/2003 15:51  Requested date-time: 12/09/2003 15:51  Receipt date-time: 12/10/2003 17:52  Reported date-time: 12/10/2003 18:58  Referring Physician:    Ordering Physician: Sung Renton ([facility])  Specimen Source: P&PLASMA     LAV ICE&LAV Ice Bath(spin in cold cent)  Source: Butler [place] Order Number: [id number]  Lab site: [facility]  [id number]

## 2003-12-09 NOTE — Unmapped (Signed)
Signed by   LinkLogic on 12/10/2003 at 01:03:12  Patient: Paula Good  Note: All result statuses are Final unless otherwise noted.    Tests: (1) DIFFERENTIAL (DIFF)  ! Neutrophil                47.0 %                      40.0-80.0  ! Lymphocyte                38.1 %                      15.0-45.0  ! Monocyte                  11.1 %                      0.0-12.0  ! Eosinophil                3.3 %                       0.0-8.0  ! Basophils                 0.5 %                       0.0-1.0    ABS NEUT                  2.0 10*3/uL                 1.0-8.0    Abs LYMPH                 1.6 10*3/uL                 0.6-3.2    Abs MONO                  0.5 10*3/uL                 0.0-1.0  ! Abs EOS                   0.1 10*3/uL                 0.0-0.6  ! Abs BASO                  0.0 10*3/uL                 0.0-0.1    Note: An exclamation mark (!) indicates a result that was not dispersed into   the flowsheet.  Document Creation Date: 12/10/2003 1:03 AM  _______________________________________________________________________    (1) Order result status: Final  Collection or observation date-time: 12/09/2003 15:51  Requested date-time: 12/09/2003 15:51  Receipt date-time: 12/09/2003 23:42  Reported date-time: 12/10/2003 01:03  Referring Physician:    Ordering Physician: Michelle Piper Tationna Fullard ([id number])  Specimen Source: WB&WHOLE BLOOD     LV5  REFRIG&LAV  (Refrig)  Source: Butler Denmark Order Number: [id number] LA01  Lab site: [facility]  [id number]

## 2003-12-16 LAB — IRON STUDIES
% Iron Saturation: 4 % — ABNORMAL LOW (ref 20–55)
Iron: 18 ug/dL — ABNORMAL LOW (ref 40–150)

## 2003-12-16 LAB — FERRITIN: Ferritin: 6.4 ng/mL — ABNORMAL LOW (ref 10.0–291.0)

## 2003-12-16 NOTE — Unmapped (Signed)
Signed by   LinkLogic on 12/17/2003 at 14:09:43  Patient: Paula Good  Note: All result statuses are Final unless otherwise noted.    Tests: (1) PROTEIN S, FUNCTIONAL (PROSFX)    Order Note: SST1,LAV1,BLU4  ! Protein S, Func      [L]  47 % Normal                 56-133      Reference Range: Female 77-130%; Female 56-133%.  Functional  protein S may   be falsely decreased when factor VIII is elevated (>250%) and / or the patient   is positive for the factor V (Leiden) variant.    Note: An exclamation mark (!) indicates a result that was not dispersed into   the flowsheet.  Document Creation Date: 12/17/2003 2:09 PM  _______________________________________________________________________    (1) Order result status: Final  Collection or observation date-time: 12/16/2003 14:15  Requested date-time: 12/16/2003 14:15  Receipt date-time: 12/17/2003 00:52  Reported date-time: 12/17/2003 14:09  Referring Physician:    Ordering Physician: Lita Mains [facility])  Specimen Source: P&PLASMA     BLUE3&3 BLUE (Plasma FRZ ALIQUOTS)  Source: Butler [place] Order Number: [id number] LA01  Lab site: [facility]  [id number]

## 2003-12-16 NOTE — Unmapped (Signed)
Signed by   LinkLogic on 12/18/2003 at 15:49:51  Patient: Paula Good  Note: All result statuses are Final unless otherwise noted.    Tests: (1) ?VWF FACTOR 8 (VWFF8)    Order Note: SST1,LAV1,BLU4  ! Factor VIII, Func         143 % Normal                60-150    Note: An exclamation mark (!) indicates a result that was not dispersed into   the flowsheet.  Document Creation Date: 12/18/2003 3:49 PM  _______________________________________________________________________    (1) Order result status: Final  Collection or observation date-time: 12/16/2003 14:15  Requested date-time: 12/16/2003 14:15  Receipt date-time: 12/17/2003 00:52  Reported date-time: 12/18/2003 15:49  Referring Physician:    Ordering Physician: Lita Mains [facility])  Specimen Source: P&PLASMA     BLUE3&3 BLUE (Plasma FRZ ALIQUOTS)  Source: Butler Denmark Order Number: [id number] LA01  Lab site: [facility]  [id number]

## 2003-12-16 NOTE — Unmapped (Signed)
Signed by   LinkLogic on 12/17/2003 at 01:25:23  Patient: Paula Good  Note: All result statuses are Final unless otherwise noted.    Tests: (1) PROTIME (PT)    Order Note: SST1,LAV1,BLU4  ! Prothrombin Time          14.2 seconds                12.3-15.0  ! INR                       1.1                         0.9-1.1      RECOMMENDED THERAPEUTIC RANGES USING INR :    Stable Oral Anticoagulant Therapy:         2.0 - 3.0    Mechanical Prosthetic Heart Valve:         2.5 - 3.5    Recurrent Acute Myocardial Infarction:    2.5 - 3.5    Note: An exclamation mark (!) indicates a result that was not dispersed into   the flowsheet.  Document Creation Date: 12/17/2003 1:25 AM  _______________________________________________________________________    (1) Order result status: Final  Collection or observation date-time: 12/16/2003 14:15  Requested date-time: 12/16/2003 14:15  Receipt date-time: 12/17/2003 00:52  Reported date-time: 12/17/2003 01:25  Referring Physician:    Ordering Physician: Lita Mains [facility])  Specimen Source: P&PLASMA     BLU REF&BLUE (Refrig)  Source: Butler Denmark Order Number: [id number] LA01  Lab site: [facility]  [id number]

## 2003-12-16 NOTE — Unmapped (Signed)
Signed by   LinkLogic on 12/18/2003 at 17:52:15  Patient: Paula Good  Note: All result statuses are Final unless otherwise noted.    Tests: (1) ?PROFILE VWFFX (PVWFFX)    Order Note: SST1,LAV1,BLU4  ! vWF, Func            [H]  292 % Normal                50-150    Note: An exclamation mark (!) indicates a result that was not dispersed into   the flowsheet.  Document Creation Date: 12/18/2003 5:52 PM  _______________________________________________________________________    (1) Order result status: Final  Collection or observation date-time: 12/16/2003 14:15  Requested date-time: 12/16/2003 14:15  Receipt date-time: 12/17/2003 00:52  Reported date-time: 12/18/2003 17:52  Referring Physician:    Ordering Physician: Lita Mains [facility])  Specimen Source: P&PLASMA     BLUE3&3 BLUE (Plasma FRZ ALIQUOTS)  Source: Butler Denmark Order Number: [id number] LA01  Lab site: [facility]  [id number]

## 2003-12-16 NOTE — Unmapped (Signed)
Signed by   LinkLogic on 12/17/2003 at 14:09:55  Patient: Paula Good  Note: All result statuses are Final unless otherwise noted.    Tests: (1) PROTEIN C, FUNCTIONAL (PROCFX)    Order Note: SST1,LAV1,BLU4  ! Protein C, Func      [L]  69 % Normal                 84-158      Functional protein C may be falsely decreased when factor VIII is elevated   (>250%) and / or the patient is positive for the factor V (Leiden) variant.    Note: An exclamation mark (!) indicates a result that was not dispersed into   the flowsheet.  Document Creation Date: 12/17/2003 2:09 PM  _______________________________________________________________________    (1) Order result status: Final  Collection or observation date-time: 12/16/2003 14:15  Requested date-time: 12/16/2003 14:15  Receipt date-time: 12/17/2003 00:52  Reported date-time: 12/17/2003 14:09  Referring Physician:    Ordering Physician: Lita Mains [facility])  Specimen Source: P&PLASMA     BLUE3&3 BLUE (Plasma FRZ ALIQUOTS)  Source: Butler Denmark Order Number: [id number] LA01  Lab site: [facility]  [address]

## 2003-12-16 NOTE — Unmapped (Signed)
Signed by   LinkLogic on 12/17/2003 at 14:09:42  Patient: Paula Good  Note: All result statuses are Final unless otherwise noted.    Tests: (1) ANTI-THROMBIN 3, (ATIII) (AT3)    Order Note: SST1,LAV1,BLU4  ! AntiThromb III, Func      91 % Normal                 80-120    Note: An exclamation mark (!) indicates a result that was not dispersed into   the flowsheet.  Document Creation Date: 12/17/2003 2:09 PM  _______________________________________________________________________    (1) Order result status: Final  Collection or observation date-time: 12/16/2003 14:15  Requested date-time: 12/16/2003 14:15  Receipt date-time: 12/17/2003 00:52  Reported date-time: 12/17/2003 14:09  Referring Physician:    Ordering Physician: Lita Mains [facility])  Specimen Source: P&PLASMA     BLUE3&3 BLUE (Plasma FRZ ALIQUOTS)  Source: Butler Denmark Order Number: [id number] LA01  Lab site: [facility]  [id number]

## 2003-12-16 NOTE — Unmapped (Signed)
Signed by   LinkLogic on 12/18/2003 at 17:18:28  Patient: Paula Good  Note: All result statuses are Final unless otherwise noted.    Tests: (1) ?PROFILE VWFAG (PVWFAG)    Order Note: SST1,LAV1,BLU4  ! vWF, Ag              [H]  609 % Normal (C)            50-160      PLEASE NOTE: The original reported value of 609 was revised or removed   from the patient record by   [facility] on  12/18/2003 15:49.         PLEASE NOTE: The original reported value of no value was revised or   removed from the patient record by   [facility] on  12/18/2003 15:42.         Results were rechecked.    Note: An exclamation mark (!) indicates a result that was not dispersed into   the flowsheet.  Document Creation Date: 12/18/2003 5:18 PM  _______________________________________________________________________    (1) Order result status: Final  Collection or observation date-time: 12/16/2003 14:15  Requested date-time: 12/16/2003 14:15  Receipt date-time: 12/17/2003 00:52  Reported date-time: 12/18/2003 17:18  Referring Physician:    Ordering Physician: Lita Mains [facility])  Specimen Source: P&PLASMA     BLUE3&3 BLUE (Plasma FRZ ALIQUOTS)  Source: Butler Denmark Order Number: [id number] LA01  Lab site: [facility]  [id number]

## 2003-12-16 NOTE — Unmapped (Signed)
Signed by   LinkLogic on 12/17/2003 at 06:13:14  Patient: Paula Good  Note: All result statuses are Final unless otherwise noted.    Tests: (1) IRON STUDIES (IS)    Order Note: SST1,LAV1,BLU4    Iron                 [L]  18 ug/dL                    64-403  ! TIBC                 [H]  462 ug/dL                   474-259    %SAT                 [L]  4 %                         20-55    Note: An exclamation mark (!) indicates a result that was not dispersed into   the flowsheet.  Document Creation Date: 12/17/2003 6:13 AM  _______________________________________________________________________    (1) Order result status: Final  Collection or observation date-time: 12/16/2003 14:15  Requested date-time: 12/16/2003 14:15  Receipt date-time: 12/17/2003 00:52  Reported date-time: 12/17/2003 06:12  Referring Physician:    Ordering Physician: Lita Mains [facility])  Specimen Source: S&SERUM     SST REFRIG&SST (Refrig)  Source: Butler Denmark Order Number: [id number] LA01  Lab site: [facility]  [id number]

## 2003-12-16 NOTE — Unmapped (Signed)
Signed by   LinkLogic on 12/18/2003 at 15:49:48  Patient: Paula Good  Note: All result statuses are Final unless otherwise noted.    Tests: (1) ?VWF PTT (VWFPTT)    Order Note: SST1,LAV1,BLU4  ! aPTT                 [H]  36.2 seconds                25.7-35.7    Note: An exclamation mark (!) indicates a result that was not dispersed into   the flowsheet.  Document Creation Date: 12/18/2003 3:49 PM  _______________________________________________________________________    (1) Order result status: Final  Collection or observation date-time: 12/16/2003 14:15  Requested date-time: 12/16/2003 14:15  Receipt date-time: 12/17/2003 00:52  Reported date-time: 12/18/2003 15:49  Referring Physician:    Ordering Physician: Lita Mains [facility])  Specimen Source: P&PLASMA     BLUE3&3 BLUE (Plasma FRZ ALIQUOTS)  Source: Butler Denmark Order Number: [id number] LA01  Lab site: [facility]  [id number]

## 2003-12-16 NOTE — Unmapped (Signed)
Signed by   LinkLogic on 12/21/2003 at 10:11:21  Patient: Paula Good  Note: All result statuses are Final unless otherwise noted.    Tests: (1) ?VWFCOM (VWFCOM)    Order Note: SST1,LAV1,BLU4  ! Interpretation (VWF)      See comment      IMPRESSION:  The results of the vWf profile are remarkable for:  1) a   slightly prolonged aPTT, and 2) elevated functional and antigenic species of   vWf.  Taken together, such results do not suggest a diagnosis of (untreated)   von Willebrand's disease.  Is the patient asymptomatic, bleeding or clotting?    Could the prolonged aPTT be due to a minor deficiency of a clotting factor   (other than factor VIII), or even a lupus anticoagulant?  The elevated level   of vWf could be due to patient anxiety at the time of sample collection.                                                                                                    Idelle Jo, MD, PhD    Note: An exclamation mark (!) indicates a result that was not dispersed into   the flowsheet.  Document Creation Date: 12/21/2003 10:11 AM  _______________________________________________________________________    (1) Order result status: Final  Collection or observation date-time: 12/16/2003 14:15  Requested date-time: 12/16/2003 14:15  Receipt date-time: 12/17/2003 00:52  Reported date-time: 12/21/2003 10:11  Referring Physician:    Ordering Physician: Lita Mains [facility])  Specimen Source: P&PLASMA     BLUE3&3 BLUE (Plasma FRZ ALIQUOTS)  Source: Butler Denmark Order Number: [id number] LA01  Lab site: [facility]  [id number]

## 2003-12-16 NOTE — Unmapped (Signed)
Signed by   LinkLogic on 12/17/2003 at 03:16:13  Patient: Paula Good  Note: All result statuses are Final unless otherwise noted.    Tests: (1) FERRITIN (FERRIT)    Order Note: SST1,LAV1,BLU4    Ferritin             [L]  6.4 ng/mL                   10.0-291.0    Note: An exclamation mark (!) indicates a result that was not dispersed into   the flowsheet.  Document Creation Date: 12/17/2003 3:16 AM  _______________________________________________________________________    (1) Order result status: Final  Collection or observation date-time: 12/16/2003 14:15  Requested date-time: 12/16/2003 14:15  Receipt date-time: 12/17/2003 00:52  Reported date-time: 12/17/2003 03:16  Referring Physician:    Ordering Physician: Lita Mains [facility])  Specimen Source: S&SERUM     SST REFRIG&SST (Refrig)  Source: Butler Denmark Order Number: [id number] LA01  Lab site: [facility]  [id number]

## 2004-03-30 LAB — IRON STUDIES
% Iron Saturation: 8 % — ABNORMAL LOW (ref 20–55)
Iron: 27 ug/dL — ABNORMAL LOW (ref 40–150)

## 2004-03-30 LAB — ENDOMYSIAL ANTIBODY IGA: Endomysial IgA: NEGATIVE

## 2004-03-30 LAB — IGA: IgA: 479 mg/dL — AB (ref 70–400)

## 2004-03-30 NOTE — Unmapped (Signed)
Signed by   LinkLogic on 03/31/2004 at 16:26:47  Patient: Girtie Buhrman  Note: All result statuses are Final unless otherwise noted.    Tests: (1) IGA (IGA)    Order Note: SST3    IgA                  [A]  479 MG/DL                   70 - 400      Testing performed by [facility] [employer] [address]  [place], [place]., [address]    Note: An exclamation mark (!) indicates a result that was not dispersed into   the flowsheet.  Document Creation Date: 03/31/2004 4:26 PM  _______________________________________________________________________    (1) Order result status: Final  Collection or observation date-time: 03/30/2004 12:46  Requested date-time: 03/30/2004 12:46  Receipt date-time: 03/31/2004 16:26  Reported date-time: 03/31/2004 16:26  Referring Physician:    Ordering Physician: Lita Mains [facility])  Specimen Source: S&SERUM     SST REFRIG&SST (Refrig)  Source: Butler Denmark Order Number: [id number] LA01  Lab site: [facility]  [id number]

## 2004-03-30 NOTE — Unmapped (Signed)
Signed by   LinkLogic on 03/30/2004 at 23:21:45  Patient: Paula Good  Note: All result statuses are Final unless otherwise noted.    Tests: (1) IRON STUDIES (IS)    Order Note: SST3    Iron                 [L]  27 ug/dL                    81-191  ! TIBC                      345 ug/dL                   478-295    %SAT                 [L]  8 %                         20-55    Note: An exclamation mark (!) indicates a result that was not dispersed into   the flowsheet.  Document Creation Date: 03/30/2004 11:21 PM  _______________________________________________________________________    (1) Order result status: Final  Collection or observation date-time: 03/30/2004 12:46  Requested date-time: 03/30/2004 12:46  Receipt date-time: 03/30/2004 15:29  Reported date-time: 03/30/2004 23:21  Referring Physician:    Ordering Physician: Lita Mains [facility])  Specimen Source: S&SERUM     SST REFRIG&SST (Refrig)  Source: Butler Denmark Order Number: [id number] LA01  Lab site: [facility]  [id number]

## 2004-03-30 NOTE — Unmapped (Signed)
Signed by   LinkLogic on 03/31/2004 at 20:26:17  Patient: Paula Good  Note: All result statuses are Final unless otherwise noted.    Tests: (1) GLIADIN ANTIBODY PANEL (CELIAC)    Order Note: SST3  ! Gliadin Ab IgG            8 U/mL                      <11             Reference Range:          <11   U/mL    Negative        11-17   U/mL    Equivocal          >17   U/mL    Positive         ! Gliadin Ab IgA            6 U/mL                      <11             Reference Range:          <11   U/mL    Negative        11-17   U/mL    Equivocal          >17   U/mL    Positive             Test Performed by Clerance Lav,      [facility],      [address], [place], [place] [address]      [phone], CLIA [id number]    Note: An exclamation mark (!) indicates a result that was not dispersed into   the flowsheet.  Document Creation Date: 03/31/2004 8:26 PM  _______________________________________________________________________    (1) Order result status: Final  Collection or observation date-time: 03/30/2004 12:46  Requested date-time: 03/30/2004 12:46  Receipt date-time: 03/31/2004 20:26  Reported date-time: 03/31/2004 20:26  Referring Physician:    Ordering Physician: Lita Mains [facility])  Specimen Source: S&SERUM     SST G&SST (1 ml S Refrig)  Source: Butler Denmark Order Number: [id number] LA01  Lab site: [facility]  [id number]

## 2004-03-30 NOTE — Unmapped (Signed)
Signed by   LinkLogic on 03/30/2004 at 20:13:44  Patient: Paula Good  Note: All result statuses are Final unless otherwise noted.    Tests: (1) FERRITIN (FERRIT)    Order Note: SST3  ! Ferritin             [L]  9.9 ng/mL                   10.0-291.0    Note: An exclamation mark (!) indicates a result that was not dispersed into   the flowsheet.  Document Creation Date: 03/30/2004 8:13 PM  _______________________________________________________________________    (1) Order result status: Final  Collection or observation date-time: 03/30/2004 12:46  Requested date-time: 03/30/2004 12:46  Receipt date-time: 03/30/2004 15:29  Reported date-time: 03/30/2004 20:13  Referring Physician:    Ordering Physician: Lita Mains [facility])  Specimen Source: S&SERUM     SST REFRIG&SST (Refrig)  Source: Butler Denmark Order Number: [id number] LA01  Lab site: [facility]  [id number]

## 2004-03-30 NOTE — Unmapped (Signed)
Signed by   LinkLogic on 03/31/2004 at 17:06:55  Patient: Paula Good  Note: All result statuses are Final unless otherwise noted.    Tests: (1) TISSUE TRANSGLUTAMINASE IGA (TRANSGLU)    Order Note: SST3  ! Transglutaminase IgA      <3 U/mL                     <5             Reference range:           <5 U/mL   Negative          5-8 U/mL   Equivocal           >8 U/mL   Positive             Test Performed by Clerance Lav,      [facility],      [address] [place], [place] [address]      [phone], CLIA [id number]    Note: An exclamation mark (!) indicates a result that was not dispersed into   the flowsheet.  Document Creation Date: 03/31/2004 5:06 PM  _______________________________________________________________________    (1) Order result status: Final  Collection or observation date-time: 03/30/2004 12:46  Requested date-time: 03/30/2004 12:46  Receipt date-time: 03/31/2004 17:06  Reported date-time: 03/31/2004 17:06  Referring Physician:    Ordering Physician: Lita Mains [facility])  Specimen Source: S&SERUM     SST G&SST (1 ml S Refrig)  Source: Butler Denmark Order Number: [id number] LA01  Lab site: [facility]  [id number]

## 2004-03-30 NOTE — Unmapped (Signed)
Signed by   LinkLogic on 03/31/2004 at 21:59:21  Patient: Paula Good  Note: All result statuses are Final unless otherwise noted.    Tests: (1) ENDOMYSIAL ANTIBODY, IGA WITH REFLEX TO TITER. (ENDOIGAT)    Order Note: SST3    Endomysial IgA Scrn       Negative                    Negative  ! Endomysial IgA Titer      not indicated      Test Performed by Clerance Lav,      [facility],      [address] [place], [place] [address]      [phone], CLIA [id number]    Note: An exclamation mark (!) indicates a result that was not dispersed into   the flowsheet.  Document Creation Date: 03/31/2004 9:59 PM  _______________________________________________________________________    (1) Order result status: Final  Collection or observation date-time: 03/30/2004 12:46  Requested date-time: 03/30/2004 12:46  Receipt date-time: 03/31/2004 21:59  Reported date-time: 03/31/2004 21:59  Referring Physician:    Ordering Physician: Lita Mains [facility])  Specimen Source: S&SERUM     SST G&SST (1 ml S Refrig)  Source: Butler Denmark Order Number: [id number] LA01  Lab site: [facility]  [id number]

## 2004-04-22 NOTE — Unmapped (Signed)
Signed by   LinkLogic on 04/22/2004 at 12:21:15  Patient: Paula Good  Note: All result statuses are Final unless otherwise noted.    Tests: (1) PROTIME (PT)    Order Note: BLU2    ! Prothrombin Time          14.2 seconds                12.3-15.0  ! INR                       1.1                         0.9-1.1      RECOMMENDED THERAPEUTIC RANGES USING INR :    Stable Oral Anticoagulant Therapy:         2.0 - 3.0    Mechanical Prosthetic Heart Valve:         2.5 - 3.5    Recurrent Acute Myocardial Infarction:    2.5 - 3.5    Note: An exclamation mark (!) indicates a result that was not dispersed into   the flowsheet.  Document Creation Date: 04/22/2004 12:21 PM  _______________________________________________________________________    (1) Order result status: Final  Collection or observation date-time: 04/22/2004 09:45  Requested date-time: 04/22/2004 09:45  Receipt date-time: 04/22/2004 11:52  Reported date-time: 04/22/2004 12:21  Referring Physician:    Ordering Physician: Lita Mains [facility])  Specimen Source: P&PLASMA     BLU REF&BLUE (Refrig)  Source: Butler Denmark Order Number: [id number] LA01  Lab site: [facility]  [id number]

## 2004-04-22 NOTE — Unmapped (Signed)
Signed by   LinkLogic on 04/22/2004 at 12:22:16  Patient: Paula Good  Note: All result statuses are Final unless otherwise noted.    Tests: (1) aPTT (PTT)    Order Note: BLU2    ! aPTT                      30.6 seconds                25.5-34.9    Note: An exclamation mark (!) indicates a result that was not dispersed into   the flowsheet.  Document Creation Date: 04/22/2004 12:22 PM  _______________________________________________________________________    (1) Order result status: Final  Collection or observation date-time: 04/22/2004 09:45  Requested date-time: 04/22/2004 09:45  Receipt date-time: 04/22/2004 11:52  Reported date-time: 04/22/2004 12:22  Referring Physician:    Ordering Physician: Lita Mains [facility])  Specimen Source: P&PLASMA     BLU REF&BLUE (Refrig)  Source: Butler Denmark Order Number: [id number] LA01  Lab site: [facility]  [id number]

## 2004-06-29 LAB — COMPREHENSIVE METABOLIC PANEL
ALT: 17 units/L (ref 3–45)
AST: 22 units/L (ref 3–35)
Albumin: 4.1 g/dL (ref 3.7–5.2)
Alkaline Phosphatase: 68 units/L (ref 44–160)
BUN: 13 mg/dL (ref 7–21)
CO2: 24 mmol/L (ref 19–32)
Calcium: 9 mg/dL (ref 8.6–10.4)
Chloride: 104 mmol/L (ref 100–110)
Creatinine: 0.9 mg/dL (ref 0.6–1.2)
Glucose: 143 mg/dL (ref 70–105)
Potassium: 3.5 mmol/L (ref 3.5–5.0)
Sodium: 139 mmol/L (ref 136–146)
Total Bilirubin: 0.5 mg/dL (ref 0.2–1.0)
Total Protein: 7.3 g/dL (ref 6.2–8.3)

## 2004-06-29 LAB — IRON STUDIES
% Iron Saturation: 8 % (ref 20–55)
Iron: 35 ug/dL (ref 40–150)
TIBC: 435 ug/dL (ref 245–400)

## 2004-06-29 LAB — ESTRADIOL (SENSITIVE): Estradiol: 4200 pg/mL — ABNORMAL LOW

## 2004-06-29 NOTE — Unmapped (Signed)
Signed by   LinkLogic on 06/29/2004 at 23:07:57  Patient: Paula Good  Note: All result statuses are Final unless otherwise noted.    Tests: (1) IRON STUDIES (IS)    Order Note: SST1    Iron                 [L]  35 ug/dL                    16-109    TIBC                 [H]  435 ug/dL                   604-540    %SAT                 [L]  8 %                         20-55    Note: An exclamation mark (!) indicates a result that was not dispersed into   the flowsheet.  Document Creation Date: 06/29/2004 11:07 PM  _______________________________________________________________________    (1) Order result status: Final  Collection or observation date-time: 06/29/2004 12:50  Requested date-time: 06/29/2004 12:50  Receipt date-time: 06/29/2004 17:38  Reported date-time: 06/29/2004 23:07  Referring Physician:    Ordering Physician: Lita Mains [facility])  Specimen Source: S&SERUM     SST REFRIG&SST (Refrig)  Source: Butler [place] Order Number: [id number]  Lab site: [facility]  [id number]

## 2004-06-29 NOTE — Unmapped (Signed)
Signed by   LinkLogic on 06/29/2004 at 22:03:17  Patient: Paula Good  Note: All result statuses are Final unless otherwise noted.    Tests: (1) FERRITIN (FERRIT)    Order Note: SST1    Ferritin             [L]  4.2 ng/mL                   10.0-291.0    Note: An exclamation mark (!) indicates a result that was not dispersed into   the flowsheet.  Document Creation Date: 06/29/2004 10:03 PM  _______________________________________________________________________    (1) Order result status: Final  Collection or observation date-time: 06/29/2004 12:50  Requested date-time: 06/29/2004 12:50  Receipt date-time: 06/29/2004 17:38  Reported date-time: 06/29/2004 22:03  Referring Physician:    Ordering Physician: Lita Mains [facility])  Specimen Source: S&SERUM     SST REFRIG&SST (Refrig)  Source: Butler [place] Order Number: [id number]  Lab site: [facility]  [id number]

## 2004-06-29 NOTE — Unmapped (Signed)
Signed by   LinkLogic on 06/29/2004 at 23:07:56  Patient: Paula Good  Note: All result statuses are Final unless otherwise noted.    Tests: (1) COMPREHENSIVE METABOLIC PANEL (METAPNL)    Order Note: SST1    Sodium                    139 mEq/L                   136-146    Potassium                 3.5 mEq/L                   3.5-5.0    Chloride                  104 mEq/L                   100-110    CO2                       24 mEq/L                    19-32  ! Anion Gap                 11 mEq/L                    3-16    BUN                       13 mg/dL                    1-61    Creatinine                0.9 mg/dL                   0.9-6.0    Glucose              [H]  143 mg/dL                   45-409    Calcium                   9.0 mg/dL                   8.1-19.1    BILI, Total               0.5 mg/dL                   4.7-8.2    AST (SGOT)                22 U/L                      3-35    ALT (SGPT)                17 U/L                      3-45    Alk Phosphatase           68 U/L                      44-160    Protein, Total  7.3 g/dL                    1.4-7.8    Albumin                   4.1 g/dL                    2.9-5.6  ! GFR MDRD Af Amer          89 See Note      GFR is estimated using Creatinine, age, gender and race. Patient's values   should be interpreted as a trend.      Between 30 and 90 ml/min/1.74m2, clinical correlation is needed.     For additional information:     www.[email or web address].org and [email or web address]    ! GFR MDRD Non Af Amer      74 See Note      GFR is estimated using Creatinine, age, gender and race. Patient's values   should be interpreted as a trend.      Between 30 and 90 ml/min/1.30m2, clinical correlation is needed.     For additional information:     www.[email or web address].org and [email or web address]    ! 1/Creatinine              1.11    Note: An exclamation mark (!) indicates a result that was not dispersed into   the flowsheet.  Document Creation Date: 06/29/2004 11:07  PM  _______________________________________________________________________    (1) Order result status: Final  Collection or observation date-time: 06/29/2004 12:50  Requested date-time: 06/29/2004 12:50  Receipt date-time: 06/29/2004 17:38  Reported date-time: 06/29/2004 23:07  Referring Physician:    Ordering Physician: Lita Mains [facility])  Specimen Source: S&SERUM     SST REFRIG&SST (Refrig)  Source: Butler [place] Order Number: [id number] LA01  Lab site: [facility]  [id number]

## 2004-07-15 NOTE — Unmapped (Signed)
Signed by   LinkLogic on 07/15/2004 at 13:06:10  Patient: Paula Good  Note: All result statuses are Final unless otherwise noted.    Tests: (1) US-ABDOMEN LIMITED ([id number])    Order NotePricilla Handler Order Number: [id number]    Order Note:     *** VERIFIED ***  [facility]  Reason:  AUTO IMMUNE HEPATITIS  Dict.Staff: Zachery Conch    Verified By: Zachery Conch       Ver: 07/15/04   1:05 pm  Exams:  US-ABDOMEN LIMITED      Ultrasound abdomen 07/15/2004.    Clinical Indication: Autoimmune hepatitis.    Comparison: 08/19/2003.    Findings:    Liver shows heterogeneous increased echogenicity which may be  due to cirrhosis or other diffuse hepatocellular disease. No  focal liver masses are appreciated within the limits of the  examination.    The gallbladder is surgically absent.    Common hepatic duct measures 6 mm in diameter, within normal  limits.    The spleen is enlarged, measuring at least 12 cm transverse  dimension and 13 cm craniocaudal.    The kidneys are normal in size and echogenicity bilaterally,  measuring 11 x 4 x 4.8 cm on the right and 11.5 x 3.6 x 4.2 cm  on the left.    Impression:    Abnormal increased liver echogenicity which may be due to  cirrhosis or the diffuse hepatocellular disease.    There has been no significant change since the prior study.  Splenomegaly again identified.  **** end of result ****    Note: An exclamation mark (!) indicates a result that was not dispersed into   the flowsheet.  Document Creation Date: 07/15/2004 1:06 PM  _______________________________________________________________________    (1) Order result status: Final  Collection or observation date-time: 07/15/2004 10:27:00  Requested date-time: 07/15/2004 10:27:00  Receipt date-time:   Reported date-time: 07/15/2004 13:05:59  Referring Physician: Cindi Carbon NON-STAFF  Ordering Physician: Michelle Piper Yeilyn Gent (NEFFGW)  Specimen Source:   Source: QRS  Filler Order Number: [id number]  Lab site: [facility]

## 2004-08-02 LAB — DIFFERENTIAL, MANUAL
Basophils Absolute: 0 10*3/uL (ref 0.0–0.1)
Basophils Relative: 0.4 % (ref 0.0–1.0)
Eosinophils Absolute: 0.1 10*3/uL (ref 0.0–0.6)
Lymphocytes Absolute: 1.9 10*3/uL (ref 0.6–3.2)
Lymphocytes Relative: 41.2 % (ref 15.0–45.0)
Monocytes Absolute: 0.5 10*3/uL (ref 0.0–1.0)
Monocytes Relative: 11.3 % (ref 0.0–12.0)
Neutrophils Absolute: 2.1 10*3/uL (ref 1.0–8.0)
Neutrophils Relative: 44.8 % (ref 40.0–80.0)

## 2004-08-02 LAB — CBC
Hematocrit: 38.4 % (ref 35.0–45.0)
Hemoglobin: 12.2 g/dL (ref 12.0–16.0)
MCH: 23.8 pg — ABNORMAL LOW (ref 27.0–34.0)
MCV: 74.9 fL — CL (ref 81.0–103.0)
MPV: 10.1 fL (ref 9.5–12.5)
Platelets: 163 10*3/uL (ref 140–400)
RBC: 5.13 10*6/uL (ref 3.90–5.40)
RDW: 23.5 % — ABNORMAL HIGH (ref 11.5–14.5)
WBC: 4.7 10*3/uL (ref 4.5–11.0)

## 2004-08-02 LAB — PROTIME-INR
INR: 1 (ref 0.9–1.1)
Protime: 13.9 s (ref 12.3–15.0)

## 2004-08-02 LAB — HEPATIC FUNCTION PANEL
ALT: 23 U/L (ref 3–45)
AST: 23 U/L (ref 3–35)
Albumin: 4.1 g/dL (ref 3.7–5.2)
Alkaline Phosphatase: 70 U/L (ref 20–125)
Bilirubin, Direct: 0.1 mg/dL (ref 0.0–0.3)
Total Bilirubin: 0.5 mg/dL (ref 0.2–1.3)
Total Protein: 7.4 g/dL (ref 6.2–8.3)

## 2004-08-02 LAB — LIPID PANEL
Cholesterol, Total: 163 mg/dL (ref 0–200)
HDL: 39 mg/dL (ref 34–61)
LDL Cholesterol: 104 mg/dL — ABNORMAL HIGH (ref 0–100)
Triglycerides: 100 mg/dL (ref 0–150)

## 2004-08-02 LAB — AMMONIA: Ammonia: 85 umol/L (ref 27–90)

## 2004-08-02 LAB — AFP TUMOR MARKER: Alpha-Fetoprotein: 2.2 ng/mL

## 2004-08-02 NOTE — Unmapped (Signed)
Signed by   LinkLogic on 08/02/2004 at 17:46:23  Patient: Paula Good  Note: All result statuses are Final unless otherwise noted.    Tests: (1) LIPID PROFILE (FATS)    Order Note: SEND RESULTS TO DR. Rodert Hinch    Order Note: SST2 L0 L0 B0    Order Note: SST2 L0 L0 B0    Cholesterol               163 mg/dL                   2-130      Please note updated reference range, effective 07/28/2004.    Triglyceride              100 mg/dL                   8-657    HDL                       39 mg/dL                    84-69    LDL, calc            [H]  629 mg/dL                   5-284    Note: An exclamation mark (!) indicates a result that was not dispersed into   the flowsheet.  Document Creation Date: 08/02/2004 5:46 PM  _______________________________________________________________________    (1) Order result status: Final  Collection or observation date-time: 08/02/2004 11:45  Requested date-time: 08/02/2004 11:45  Receipt date-time: 08/02/2004 13:34  Reported date-time: 08/02/2004 17:46  Referring Physician:    Ordering Physician: Michelle Piper Kalden Wanke (NEFFGW)  Specimen Source: S&SERUM     SST REFRIG&SST (Refrig)  Source: Butler [place] Order Number: [id number] LA01  Lab site: [facility]  [id number]

## 2004-08-02 NOTE — Unmapped (Signed)
Signed by   LinkLogic on 08/02/2004 at 18:27:53  Patient: Paula Good  Note: All result statuses are Final unless otherwise noted.    Tests: (1) DIFFERENTIAL (DIFF)    Order Note: SEND RESULTS TO DR. Koda Routon    Order Note: SST2 L0 L0 B0    Neutrophil                44.8 %                      40.0-80.0    Lymphocyte                41.2 %                      15.0-45.0    Monocyte                  11.3 %                      0.0-12.0    Eosinophil                2.3 %                       0.0-8.0    Basophils                 0.4 %                       0.0-1.0    ABS NEUT                  2.1 10*3/uL                 1.0-8.0    Abs LYMPH                 1.9 10*3/uL                 0.6-3.2    Abs MONO                  0.5 10*3/uL                 0.0-1.0    Abs EOS                   0.1 10*3/uL                 0.0-0.6    Abs BASO                  0.0 10*3/uL                 0.0-0.1    Note: An exclamation mark (!) indicates a result that was not dispersed into   the flowsheet.  Document Creation Date: 08/02/2004 6:27 PM  _______________________________________________________________________    (1) Order result status: Final  Collection or observation date-time: 08/02/2004 11:45  Requested date-time: 08/02/2004 11:45  Receipt date-time: 08/02/2004 13:34  Reported date-time: 08/02/2004 18:27  Referring Physician:    Ordering Physician: Michelle Piper Nalin Mazzocco (NEFFGW)  Specimen Source: WB&WHOLE BLOOD     LV5  REFRIG&LAV  (Refrig)  Source: Butler [place] Order Number: [id number] LA01  Lab site: [facility]  [id number]      -----------------    The following results were not dispersed to  the flowsheet  because of errors during the import process:      Eosinophil, 2.3 %, (F)

## 2004-08-02 NOTE — Unmapped (Signed)
Signed by   LinkLogic on 08/02/2004 at 18:27:54  Patient: Paula Good  Note: All result statuses are Final unless otherwise noted.    Tests: (1) CBC (CBC)    Order Note: SEND RESULTS TO DR. Gareth Fitzner    Order Note: SST2 L0 L0 B0    WBC                       4.7 10*3/uL                 4.5-11.0    RBC                       5.13 10*6/uL                3.90-5.40    Hgb                       12.2 g/dL                   75.6-43.3    HCT                       38.4 %                      35.0-45.0    MCV                  [LL] 74.9 fL                     81.0-103.0    MCH                  [L]  23.8 pg                     27.0-34.0  ! MCHC                      31.8 g/dL                   29.5-18.8    RDW                  [H]  23.5 %                      11.5-14.5    Platelet Count            163 10*3/uL                 140-400    MPV                       10.1 fL                     9.5-12.5    Note: An exclamation mark (!) indicates a result that was not dispersed into   the flowsheet.  Document Creation Date: 08/02/2004 6:27 PM  _______________________________________________________________________    (1) Order result status: Final  Collection or observation date-time: 08/02/2004 11:45  Requested date-time: 08/02/2004 11:45  Receipt date-time: 08/02/2004 13:34  Reported date-time: 08/02/2004 18:27  Referring Physician:    Ordering Physician: Michelle Piper Sully Dyment (NEFFGW)  Specimen Source: WB&WHOLE BLOOD     LV5  REFRIG&LAV  (Refrig)  Source: Butler [place] Order Number: [id number] LA01  Lab site: [facility], [address]  [place]  [address]

## 2004-08-02 NOTE — Unmapped (Signed)
Signed by   LinkLogic on 08/02/2004 at 14:37:54  Patient: Paula Good  Note: All result statuses are Final unless otherwise noted.    Tests: (1) AMMONIA (AMON)    Order Note: SEND RESULTS TO DR. Sharalee Witman    Order Note: SST2 L0 L0 B0    Ammonia                   85 ug/dL                    42-59      Effective 04/05/2004, ammonia will be performed using a new Olympus assay   formulation.  Please note new reference interval.    Note: An exclamation mark (!) indicates a result that was not dispersed into   the flowsheet.  Document Creation Date: 08/02/2004 2:37 PM  _______________________________________________________________________    (1) Order result status: Final  Collection or observation date-time: 08/02/2004 11:45  Requested date-time: 08/02/2004 11:45  Receipt date-time: 08/02/2004 13:34  Reported date-time: 08/02/2004 14:37  Referring Physician:    Ordering Physician: Lejend Dalby (NEFFGW)  Specimen Source: P&PLASMA     LAV 50&LAV draw ice bath ( P frz)  Source: Butler [place] Order Number: [id number] LA01  Lab site: [facility]  [id number]      -----------------    The following lab values were dispersed to the flowsheet  with no units conversion:      Ammonia, 85 UG/DL, (F)  expected units: micromoles/L

## 2004-08-02 NOTE — Unmapped (Signed)
Signed by   LinkLogic on 08/03/2004 at 12:12:59  Patient: Paula Good  Note: All result statuses are Final unless otherwise noted.    Tests: (1) AFP TUMOR MARKER (A-FETO)    Order Note: SEND RESULTS TO DR. Adelyne Marchese    Order Note: SST2 L0 L0 B0    Alpha Fetoprotein         2.2 ng/mL      AFP (Tumor Marker) Reference Ranges:   97.5% of Healthy Subjects: Less than 8.9 ng/mL.  AFP tumor marker is performed by [facility] Immulite 2000 using a solid-phase, two   site sequential chemiluminescent immunometric assay.  Results obtained with   different assays or kits cannot be used interchangeably.  AFP Tumor Marker   results are not interpretable in pregnant women. The AFP assay may be used as   an aid in the management of nonseminatous  testicular cancer patients in   conjunction with the clinical evaluation and otherdiagnostic procedures. The   AFP assay is not recommended as a screening procedure to  detect cancer in the   general population.          Note: An exclamation mark (!) indicates a result that was not dispersed into   the flowsheet.  Document Creation Date: 08/03/2004 12:12 PM  _______________________________________________________________________    (1) Order result status: Final  Collection or observation date-time: 08/02/2004 11:45  Requested date-time: 08/02/2004 11:45  Receipt date-time: 08/02/2004 13:34  Reported date-time: 08/03/2004 12:12  Referring Physician:    Ordering Physician: Michelle Piper Latishia Suitt (NEFFGW)  Specimen Source: S&SERUM     SST10 REFRIG&SST (Refrig 10ml)  Source: Butler [place] Order Number: [id number] LA01  Lab site: [facility]  [id number]

## 2004-08-02 NOTE — Unmapped (Signed)
Signed by   LinkLogic on 08/02/2004 at 15:54:19  Patient: Paula Good  Note: All result statuses are Final unless otherwise noted.    Tests: (1) PROTIME (PT)    Order Note: SEND RESULTS TO DR. Missy Baksh    Order Note: SST2 L0 L0 B0    Prothrombin Time          13.9 seconds                12.3-15.0    INR                       1.0                         0.9-1.1      RECOMMENDED THERAPEUTIC RANGES USING INR :    Stable Oral Anticoagulant Therapy:         2.0 - 3.0    Mechanical Prosthetic Heart Valve:         2.5 - 3.5    Recurrent Acute Myocardial Infarction:    2.5 - 3.5    Note: An exclamation mark (!) indicates a result that was not dispersed into   the flowsheet.  Document Creation Date: 08/02/2004 3:54 PM  _______________________________________________________________________    (1) Order result status: Final  Collection or observation date-time: 08/02/2004 11:45  Requested date-time: 08/02/2004 11:45  Receipt date-time: 08/02/2004 13:34  Reported date-time: 08/02/2004 15:54  Referring Physician:    Ordering Physician: Michelle Piper Alycia Cooperwood (NEFFGW)  Specimen Source: P&PLASMA     BLU REF&BLUE (Refrig)  Source: Butler [place] Order Number: [id number] LA01  Lab site: [facility]  [id number]

## 2004-08-02 NOTE — Unmapped (Signed)
Signed by   LinkLogic on 08/02/2004 at 17:46:22  Patient: Mikylah Reinecke  Note: All result statuses are Final unless otherwise noted.    Tests: (1) HEPATIC FUNCTION PANEL (LIVP)    Order Note: SEND RESULTS TO DR. Adel Neyer    Order Note: SST2 L0 L0 B0    BILI, Total               0.5 mg/dL                   1.6-1.0      Please note updated reference range, effective 07/28/2004.    BILI, Direct              0.1 mg/dL                   9.6-0.4    AST (SGOT)                23 U/L                      3-35    ALT (SGPT)                23 U/L                      3-45    Alk Phosphatase           70 U/L                      20-125      Please note updated reference range, effective 07/28/2004.    Protein, Total            7.4 g/dL                    5.4-0.9    Albumin                   4.1 g/dL                    8.1-1.9    Note: An exclamation mark (!) indicates a result that was not dispersed into   the flowsheet.  Document Creation Date: 08/02/2004 5:46 PM  _______________________________________________________________________    (1) Order result status: Final  Collection or observation date-time: 08/02/2004 11:45  Requested date-time: 08/02/2004 11:45  Receipt date-time: 08/02/2004 13:34  Reported date-time: 08/02/2004 17:46  Referring Physician:    Ordering Physician: Michelle Piper Nicolas Banh (NEFFGW)  Specimen Source: S&SERUM     SST REFRIG&SST (Refrig)  Source: Butler [place] Order Number: [id number] LA01  Lab site: [facility]  [id number]

## 2004-08-08 NOTE — Unmapped (Signed)
Signed by Dickie La Huf on 08/08/2004 at 13:24:17    Phone Note   Call from Patient  Patient Cell Phone #: cell [phone]  Caller: patient  Department: IM -Gastroenterology  Call for: KAREN/DR NEFF    Summary of Call:  CALLING REGARDING THE LETTER FROM DR NEFF, WANTS TO KNOW IF HE APPROVED IT OR NOT, PLEASE GIVE HER A CALL ON HER CELL.     Initial call taken by: Kyra Leyland,  Aug 08, 2004 1:24 PM    This department is not currently using the EMR system. Please see the paper chart for this patient for the response to this phone message.

## 2004-08-08 NOTE — Unmapped (Signed)
Signed by Vonzella Nipple Combs on 08/08/2004 at 11:24:34    Phone Note   Call from Patient  Patient Cell Phone #: cell [phone]  Department: IM - General  Call for: neff  Reason for Call: speak with nurse    Summary of Call:  did you ever send over a permission letter to the pt's rheumatology dr. (dr. blatt (?on spelling)) to okay meds for the pt to take?     Initial call taken by: Burnis Medin,  Aug 08, 2004 11:24 AM    This department is not currently using the EMR system. Please see the paper chart for this patient for the response to this phone message.

## 2004-10-31 NOTE — Unmapped (Signed)
Signed by   LinkLogic on 10/31/2004 at 10:37:44    Appointment status changed to no show by  LinkLogic on 10/31/2004 10:37 AM.    No Show Comments  ----------------  (MOF) MENTAL HEALTH    Appointment Information  -----------------------  Appt Type:         Date:  Monday, October 31, 2004       Time:  9:00 AM for 45 min    Urgency:  Routine    Made By:  LinkLogic   To Visit:  [facility]     Reason:  (MOF) MENTAL HEALTH    Appt Comments  -------------  -- 10/31/04 10:37: ([facility]) NO SHOW --  (MOF) MENTAL HEALTH  .    -- 10/27/04 13:33: Cherlynn Polo) BOOKED --  Routine  at 10/31/2004 9:00 AM for 45 min  (MOF) MENTAL HEALTH  .

## 2004-11-30 LAB — IRON STUDIES
% Iron Saturation: 55 % (ref 20–55)
Iron: 166 ug/dL (ref 35–175)
TIBC: 300 ug/dL (ref 245–400)

## 2004-11-30 LAB — ESTRADIOL (SENSITIVE): Estradiol: 140700 pg/mL

## 2004-11-30 NOTE — Unmapped (Signed)
Signed by   LinkLogic on 11/30/2004 at 23:08:01  Patient: Paula Good  Note: All result statuses are Final unless otherwise noted.    Tests: (1) IRON STUDIES (IS)    Order Note: SST1    Iron                      166 mcg/dL                  66-063    TIBC                      300 ug/dL                   016-010    %SAT                      55 %                        20-55    Note: An exclamation mark (!) indicates a result that was not dispersed into   the flowsheet.  Document Creation Date: 11/30/2004 11:08 PM  _______________________________________________________________________    (1) Order result status: Final  Collection or observation date-time: 11/30/2004 10:00  Requested date-time: 11/30/2004 10:00  Receipt date-time: 11/30/2004 19:51  Reported date-time: 11/30/2004 23:09  Referring Physician:    Ordering Physician: Lita Mains [facility])  Specimen Source: S&SERUM     SST REFRIG&SST (Refrig)  Source: Butler Denmark Order Number: [id number]  Lab site: [facility]      [address]      [place] [address]  [phone]

## 2004-11-30 NOTE — Unmapped (Signed)
Signed by   LinkLogic on 12/01/2004 at 01:11:32  Patient: Paula Good  Note: All result statuses are Final unless otherwise noted.    Tests: (1) FERRITIN (FERRIT)    Order Note: SST1    Ferritin                  140.7 ng/mL                 10.0-232.0    Note: An exclamation mark (!) indicates a result that was not dispersed into   the flowsheet.  Document Creation Date: 12/01/2004 1:11 AM  _______________________________________________________________________    (1) Order result status: Final  Collection or observation date-time: 11/30/2004 10:00  Requested date-time: 11/30/2004 10:00  Receipt date-time: 11/30/2004 19:51  Reported date-time: 12/01/2004 01:12  Referring Physician:    Ordering Physician: Lita Mains [facility])  Specimen Source: S&SERUM     SST REFRIG&SST (Refrig)  Source: Butler Denmark Order Number: [id number] LA01  Lab site: [facility]      [address]      [place] [address]  [phone]

## 2005-01-09 NOTE — Unmapped (Addendum)
Signed by Eliezer Champagne MD on 01/09/2005 at 16:16:56    History of Present Illness:   Chief Complaint: Hematuria  She is known to have anemia and the iron is stable now. She was found to have blood in urine.  She has had uterin lining ablation. She also has suprapubic pain as well.  She has been smoking for 20 years and one PPD.  Past History  Past Medical History (reviewed - no changes required):  Hypertension, GERD, Hematuria, Anemia, Rheumatoid Arthritis, Non-Alcoholic Serosis of the Liver    Surgical History (reviewed - no changes required):  Cholecystectomy: *, Cesarean Section: x3, Liver Biopsy: benign      Social History (reviewed - no changes required): Marital Status: married,   Children: 3,   Employment Status: employed part-time,   Occupation: Medical laboratory scientific officer  Alcohol Use: none  Tobacco Usage:smoker  Cigarettes-Packs per Day- 1,           Review of Systems   General: Denies any specific issues at this time.   Eyes: Denies any specific issues at this time.   Ears/Nose/Throat: Denies any specific issues at this time.   Cardiovascular: Denies any specific issues at this time.   Respiratory: Denies any specific issues at this time.   Gastrointestinal: Denies any specific issues at this time.   Genitourinary: Complains of hematuria. Denies vaginal discharge, incontinence, day time wetting, dysuria, urinary frequency, amenorrhea, menorrhagia, abnormal vaginal bleeding, pelvic pain, nocturia, flank pain, weak stream, precipitance, incomplete empty, retention, enuresis, perineal rash, tea-colored urine.   Musculoskeletal: Denies any specific issues at this time.   Skin: Denies any specific issues at this time.   Neurologic: Denies any specific issues at this time.   Psychiatric: Denies any specific issues at this time.   Endocrine: Denies any specific issues at this time.   Heme/Lymphatic: Denies any specific issues at this time.   Allergic/Immunologic: Denies any specific issues at this time.       Vital  Signs   Height: 65 in.    Weight: 145 lbs.   Temperature: 98.6 degrees  F Respirations: 16   Pulse rate: 68   Pulse rhythm: regular   Blood pressure: 132/ 88 mmHg       Allergies  ! CODEINE    Medications   PREDNISONE TABS (PREDNISONE TABS) qd  NEXIUM CPDR (ESOMEPRAZOLE MAGNESIUM CPDR) qd  NAPROSYN TABS (NAPROXEN TABS) qd  CYTOTEC 100 MCG TABS (MISOPROSTOL) qd  MAXZIDE 75-50 MG TABS (TRIAMTERENE-HCTZ) qd  ARAVA 10 MG TABS (LEFLUNOMIDE) qd    Intake recorded by: Phillip Heal  January 09, 2005 3:14 PM      Physical Exam   General Appearance: well-developed, well-nourished and in no acute distress    Eyes   External: anicteric sclera, conjunctiva not injected, lids no lesions or swelling  Ophthalmoscopic: discs sharp and flat, no a/v nicking, hemorrhages, or exudates    Ear, Nose &Throat   External Ears: no lesions or deformities, no tenderness  Hearing: grossly intact    Neck   Neck Exam: supple, no masses, trachea midline, no meningismus, no nuchal rigidity, no carotid bruit, no lymphadenopathy    Respiratory   Respiratory Effort: breathing comfortably, no increased work of breathing  Auscultation: CTA bilaterally with symmetric breath sounds, good air movement, no crackles bronchi or wheezing noted    Cardiovascular   Palpation: no thrill, no displacement of PMI  Carotid arteries: pulses 2+, symmetric, no bruits  Abdominal aorta: no enlargement or bruits, no  tenderness    Gastrointestinal   Abdomen: soft, non-tender, non-distended, no masses,  bowel sounds present, no rebound tenderness, no guarding, no organomegaly  Liver and Spleen: no enlargement or nodularity  Hernia: no hernias    Genitourinary   External Genitalia: Intact, no lesions, ulcers or discharge  Vagina: Intact, no discharge, lesions or masses; no cystocele or rectocele, normal vascularity  Urethra: no discharge, no erythema  Bladder: no cystocele    Mental Status   Judgement, insight: awake and alert  Orientation: oriented to time, place, and  person  Memory: intact for recent and remote events  Mood and affect: no agitation, good eye contact      Problem List Updates for today's visit   1. Added HEMATURIA (ICD-599.7)    Instructions for today's visit  She has no RBC'S or WBC'S,  therefore we decided against the cystoscopy.    DISPOSITION:    Return to clinic after testing        cc:   Dr. Tyler Aas            ]  Signed by Phillip Heal on 02/28/2005 at 13:50:43    Reviewed, no change, 03/01/2005, per Derenda Giddings

## 2005-01-11 NOTE — Unmapped (Signed)
Signed by Eliezer Champagne MD on 01/11/2005 at 00:00:00  IVP      Imported By: Coletta Memos 01/23/2005 09:45:11    _____________________________________________________________________    External Attachment:    Please see [facility] EMR for this document.

## 2005-01-16 LAB — OFFICE VISIT LAB RESULTS
Bilirubin Urine: NEGATIVE
Blood, UA: NEGATIVE
Glucose, UA: NEGATIVE
Ketones, UA: NEGATIVE
Leukocytes, UA: NEGATIVE
Nitrite, UA: NEGATIVE
Protein, UA: NEGATIVE
Specific Gravity, UA: 1.005
Urobilinogen, UA: NORMAL
pH, UA: 6

## 2005-01-16 NOTE — Unmapped (Signed)
Signed by Phillip Heal on 01/16/2005 at 10:38:18    Phone Note   Call from Patient  Patient Cell Phone #: cell [phone]  Caller: patient  Department: Surgery - Urology  Call for: [phone]    Summary of Call:  WANTS TO KNOW SINCE SHE IS UNABLE TO MAKE APPT TODAY IF YOU CAN CALL HER WITH HER RESULTS. PLEASE CAL CELL @ [phone], ALSO CAN YOU CALL HER TO RESCHEDULE APPT THAT SHE MISSED TODAY     Initial call taken by: Kyra Leyland,  January 16, 2005 10:23 AM      Follow-up for Phone Call   Called patient and gave appointment for 1:00 PM.  Phone Call Completed  Follow-up by: Phillip Heal,  January 16, 2005 10:37 AM

## 2005-01-16 NOTE — Unmapped (Signed)
Signed by Phillip Heal on 02/02/2005 at 09:41:47    UCP Surgery Scheduling Form    Surgery  / Procedure Schedule Sheet   Requested Date: 03/03/2005  Requested Time: 8:45AM  Length of Surgery: 1 hour  Comment: Next Available  Surgeon: Eliezer Champagne MD  Facility: [facility]  Type of patient: Private     Is patient: Out Pt.  Anesthesia Type: General    Allergies:   ! CODEINE      Latex Sensitive: No  PreAdmission Testing done at: [facility]    Procedure   Procedure: Cystoscopy  Diagnoses: HEMATURIA (ICD-599.7)    Procedure: Bilateral Bladder Hydrodistension  Diagnoses: HEMATURIA (ICD-599.7)      Patient Information   Name: Paula Good  DOB: 05-11-1965  SSN: [id number]  Address: [address]  [place], [place]  [address]  Gender: Female  Home phone: [phone]  Cell phone: cell [phone]  IDX #: [id number]  Last Word #: [id number]  PCP: Dr. Tyler Aas    Insurance Information   Primary Insurance: [employer]  Member ID #: [id number]  Form Completed By: Melany Guernsey  Phone # [phone]  Date: 01/25/2005  Surgery Scheduled with: Arlina Robes  Date: 02/01/2005  Surgery Confirmed: Arlina Robes  Date: 02/01/2005    Surgery Notification   Scheduled Date: 03/03/2005  Scheduled Time: 8:45AM  Comments: Patient notified of surgery info.  Sent to precert.

## 2005-01-16 NOTE — Unmapped (Addendum)
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  CAT scan is also negative. Today's U/A is also negative.  Past History  Past Medical History (reviewed - no changes required):  Hypertension, GERD, Hematuria, Anemia, Rheumatoid Arthritis, Non-Alcoholic Serosis of the Liver    Surgical History (reviewed - no changes required):  Cholecystectomy: *, Cesarean Section: x3, Liver Biopsy: benign      Social History (reviewed - no changes required): Marital Status: married,   Children: 3,   Employment Status: employed part-time,   Occupation: Medical laboratory scientific officer  Alcohol Use: none  Tobacco Usage:smoker  Cigarettes-Packs per Day- 1,         Routine Urinalysis   Date Collected: 01/16/2005  Date Received: 01/16/2005    Physical characteristics   Color: yellow  Appearance: clear    Chemical measurements   Urobilinogen (mg/dL): normal  Glucose (mg/dL): negative  Ketone (mg/dL): negative  Bilirubin: negative  Protein (mg/dL): negative  Nitrite: negative  Leukocytes: negative  Blood: negative  pH: 6  Spec. Gravity: 1.005    Review of Systems   General: Denies any specific issues at this time.   Gastrointestinal: Denies any specific issues at this time.   Genitourinary: Denies any specific issues at this time. LRQ pain      Vital Signs   Height: 65 in.    Weight: 149 lbs.   Temperature: 98.6 degrees  F Respirations: 16   Pulse rate: 72   Pulse rhythm: regular       Allergies  ! CODEINE    Medications   PREDNISONE TABS (PREDNISONE TABS) qd  NEXIUM CPDR (ESOMEPRAZOLE MAGNESIUM CPDR) qd  NAPROSYN TABS (NAPROXEN TABS) qd  CYTOTEC 100 MCG TABS (MISOPROSTOL) qd  MAXZIDE 75-50 MG TABS (TRIAMTERENE-HCTZ) qd  ARAVA 10 MG TABS (LEFLUNOMIDE) qd    Intake recorded by: Phillip Heal  January 16, 2005 12:53 PM          Additional Plan  I'll schedule her for cystoscopy and bilateral retrograde to complete the investigation.         cc:   Dr. Tyler Aas            ]  Signed  by Eliezer Champagne MD on 02/28/2005 at 13:47:43    Reviewed, no changes.  02/28/2005  Lisbet Busker

## 2005-01-16 NOTE — Unmapped (Signed)
Signed by Eliezer Champagne MD on 01/16/2005 at 00:00:00  Surgery Orders      Imported By: Coletta Memos 03/13/2005 15:13:50    _____________________________________________________________________    External Attachment:    Please see [facility] EMR for this document.

## 2005-01-16 NOTE — Unmapped (Signed)
Signed by Phillip Heal on 01/16/2005 at 10:05:18    Phone Note   Call from Patient  Call back at Home Phone: [phone]  Patient Cell Phone #: cell [phone]  Caller: patient  Department: Surgery - Urology  Call for: HAMIDINIA    Summary of Call:  HAS APPT AT 10:40 - PLEASE CALL FOR XRAY RESULTS  [phone] AND THEY CAN FAX THEM OVER     Initial call taken by: Verlene Mayer,  January 16, 2005 9:47 AM      Follow-up for Phone Call   Spoke with B. Roderic Ovens regarding this patients films.  Patient states that B. [place] needs authorization to release the films.  BRoderic Ovens says this is not so.  BRoderic Ovens is going to call the patient.  Patient is to bring her films to her appointment.  Phone Call Completed  Follow-up by: Phillip Heal,  January 16, 2005 10:02 AM

## 2005-01-19 NOTE — Unmapped (Signed)
Signed by Phillip Heal on 01/19/2005 at 09:16:15        [facility]- [facility]   [address]  [place], [place] [address]   [phone]  Fax: [phone]           January 16, 2005              RE: Paula Good   DOB:  05/14/1963      Dear Dr. Tyler Aas ,    I recently saw Paula Good in the office. Enclosed please find my office note and recommendations regarding this patient.  Should you have any further questions please do not hesitate to contact me at [phone]    I would like to thank you for allowing me to see this patient.       Best personal regards.    Sincerely,          Eliezer Champagne, M.D., F.A.C.S.  Clinical Professor of Urology  [facility]      FAXED WITHOUT SIGNATURE TO EXPEDITE COMMUNICATION    CC  Dr. Tyler Aas

## 2005-01-19 NOTE — Unmapped (Signed)
Signed by Zenaida Deed Yetta Flock MA on 01/20/2005 at 11:05:04    Phone Note   Call from Patient  Call back at Home Phone: [phone]  Patient Cell Phone #: cell [phone]  Caller: patient  Department: Surgery - Urology  Call for: [facility]    Summary of Call:  RETURNING CALL TO [facility] KIDNEY SURGERY.     Initial call taken by: Willette Alma,  January 19, 2005 1:38 PM      Follow-up for Phone Call   OR 02/01/05, arrive at 7:30am at [facility] per [facility].  Pt. informed.  She will have a potassium level drawn tuesday.  Order given to hem/onc.  Follow-up by: Zenaida Deed. Yetta Flock, [place],  January 20, 2005 11:04 AM

## 2005-01-20 NOTE — Unmapped (Signed)
Signed by Zenaida Deed Yetta Flock MA on 01/20/2005 at 11:08:04    Clinical Lists Changes    Problems:  Added new problem of PREOPERATIVE EXAMINATION (ICD-V72.84)  Orders:  Added new Test order of Potassium  (K) (733) ([id number]) - Signed

## 2005-01-24 NOTE — Unmapped (Signed)
Signed by Phillip Heal on 01/24/2005 at 15:29:23      Surgery Schedule Change Request Form     Surgeon: Eliezer Champagne MD    Reason for Change     Cancel Surgery   Surgery  Date: 02/01/2005  Time: 9:30AM  Reason For Cancellation: Pt. will call back to reschedule.    Do you want to schedule another patient on this date and time: No    Confirmed with: OR SCHEDULING  [facility]    Completed By: Melany Guernsey  Date: 01/24/2005

## 2005-01-25 NOTE — Unmapped (Signed)
Signed by Phillip Heal on 01/25/2005 at 11:15:27    Phone Note   Call from Patient  Call back at Home Phone: [phone]  Patient Cell Phone #: cell [phone]  Caller: patient  Call for: HAMIDINIA    Summary of Call:  RETURNING CALL SAID ANY WEDS OR FRI WILL DO     Initial call taken by: Willette Alma,  January 25, 2005 10:45 AM      Follow-up for Phone Call   Patient wants her surgery on a wednesday or a friday.  Request sent to [facility].  Phone Call Completed  Follow-up by: Phillip Heal,  January 25, 2005 11:15 AM

## 2005-02-01 NOTE — Unmapped (Signed)
Signed by Phillip Heal on 02/01/2005 at 15:10:34    Phone Note   Call from Patient  Patient Cell Phone #: cell [phone]  Caller: spouse -ROBERT Nerio  Department: Surgery - Urology  Call for: [facility]    Summary of Call:  HAS SURG DATE BEEN SCHED YET ?  PLEASE CALL : [phone]     Initial call taken by: Anders Simmonds,  February 01, 2005 2:28 PM      Follow-up for Phone Call   LM for patient.  Surgery is scheduled for 03/03/05 @ 8:45am.  Waiting on confirmation.  Phone Call Completed  Follow-up by: Phillip Heal,  February 01, 2005 3:09 PM

## 2005-02-09 NOTE — Unmapped (Signed)
Signed by Phillip Heal on 02/09/2005 at 16:58:46    Phone Note   Call from Patient  Patient Cell Phone #: cell [phone]  Caller: patient  Department: Surgery - Urology  Call for: [facility]    Summary of Call:  NEED TO KNOW SURGERY DATE  PLEASE CALL ON CELL # [phone]     Initial call taken by: Verlene Mayer,  February 09, 2005 4:30 PM      Follow-up for Phone Call   LM for patient.  I have tried calling this patient numerous times and get voice mail.  Phone Call Completed  Follow-up by: Phillip Heal,  February 09, 2005 4:57 PM

## 2005-02-27 NOTE — Unmapped (Signed)
Signed by Adonis Huguenin. Stancliff on 02/28/2005 at 11:53:50  Patient: Paula Good  Note: All result statuses are Final unless otherwise noted.    Tests: (1) POTASSIUM (QDL-733)    POTASSIUM                 3.9 mmol/L                  3.5-5.3    Note: An exclamation mark (!) indicates a result that was not dispersed into   the flowsheet.  Document Creation Date: 02/28/2005 1:32 AM  _______________________________________________________________________    (1) Order result status: Final  Collection or observation date-time: 02/27/2005 14:35  Requested date-time:   Receipt date-time: 02/27/2005 23:28  Reported date-time: 02/28/2005 01:00  Referring Physician:    Ordering Physician: Crissie Figures ([id number]) UNSPECIFIED [phone])  Specimen Source: S  Source: Arline Asp Order Number: [id number]  Lab site: [facility] [place]      [address]      [place]    [id number]

## 2005-02-27 NOTE — Unmapped (Signed)
Signed by Phillip Heal on 02/27/2005 at 11:22:07    Phone Note   Call from Patient  Call back at Home Phone: [phone]  Patient Cell Phone #: cell [phone]  Caller: patient  Department: Surgery - Urology  Call for: HAMIDINIA    Summary of Call:  NEEDS TO KNOW IF SHE NEEDS A PREOP FOR SURGERY  IF SO SHE HAS NO INFORMATION AS TO BLOODWORK  OR SCRIPTS     Initial call taken by: Earnest Conroy,  February 27, 2005 9:50 AM      Follow-up for Phone Call   Patient has known she needs her blood drawn for Potassium.  Phone Call Completed  Follow-up by: Phillip Heal,  February 27, 2005 11:21 AM

## 2005-02-27 NOTE — Unmapped (Signed)
Signed by Adonis Huguenin. Stancliff on 02/28/2005 at 11:53:50  Patient: Paula Good  Note: All result statuses are Final unless otherwise noted.    Tests: (1) URINALYSIS, REFLEX (QDL-7909)    COLOR                     YELLOW                      YELLOW    APPEARANCE                CLEAR                       CLEAR    SPECIFIC GRAVITY          1.005                       1.001-1.035    PH                        8.0                         5.0-8.0    GLUCOSE                   NEGATIVE                    NEGATIVE    BILIRUBIN                 NEGATIVE                    NEGATIVE    KETONES                   NEGATIVE                    NEGATIVE    OCCULT BLOOD              NEGATIVE                    NEGATIVE    PROTEIN                   NEGATIVE                    NEGATIVE    NITRITE                   NEGATIVE                    NEGATIVE    LEUKOCYTE ESTERASE        NEGATIVE                    NEGATIVE    Note: An exclamation mark (!) indicates a result that was not dispersed into   the flowsheet.  Document Creation Date: 02/28/2005 1:33 AM  _______________________________________________________________________    (1) Order result status: Final  Collection or observation date-time: 02/27/2005 14:35  Requested date-time:   Receipt date-time: 02/27/2005 23:28  Reported date-time: 02/28/2005 01:00  Referring Physician:    Ordering Physician: Crissie Figures ([id number]) UNSPECIFIED [phone])  Specimen Source: R  Source: Arline Asp Order Number: [id number]  Lab site: [facility] [place]      [address]      [place]  [facility]  [id number]      -----------------    The following non-numeric lab results were dispersed to  the flowsheet even though numeric results were expected:      GLUCOSE, NEGATIVE

## 2005-02-27 NOTE — Unmapped (Signed)
Signed by Eliezer Champagne MD on 02/27/2005 at 00:00:00  Urinalysis      Imported By: Coletta Memos 03/13/2005 15:13:30    _____________________________________________________________________    External Attachment:    Please see [employer] EMR for this document.

## 2005-02-28 NOTE — Unmapped (Signed)
Signed by Phillip Heal on 02/28/2005 at 16:25:19    Phone Note   Call from Patient  Patient Cell Phone #: cell [phone]  Caller: patient  Department: Surgery - Urology  Call for: [facility]  Reason for Call: test results    Summary of Call:  URINE TEST DONE YESTERDAY   PLEASE CALL CELL     Initial call taken by: Anders Simmonds,  February 28, 2005 4:15 PM      Follow-up for Phone Call   Patient had potassium done, no urinalysis.  Results are not back yet.  Will fax results to [facility] once complete.  Patient notified.  Phone Call Completed  Follow-up by: Phillip Heal,  February 28, 2005 4:24 PM

## 2005-03-01 NOTE — Unmapped (Signed)
Signed by Phillip Heal on 03/02/2005 at 09:50:41    Phone Note   Call from Patient  Patient Cell Phone #: cell [phone]  Caller: patient  Department: Surgery - Urology  Call for: Paula Good    Summary of Call:  pt says that she never got the potasssium results when she spoke with you yesterday, Also wanted to know if Urine came back yet? Her # is [phone]     Initial call taken by: Kyra Leyland,  March 01, 2005 4:29 PM      Follow-up for Phone Call   LM for patient.  All results are great.  Phone Call Completed  Follow-up by: Phillip Heal,  March 02, 2005 9:50 AM

## 2005-03-03 NOTE — Unmapped (Signed)
Signed by   [employer] on 03/05/2005 at 01:26:05  Patient: [name]  Note: All result statuses are Final unless otherwise noted.    Tests: (1)  (MR)    Order Note:                                 [facility]     PATIENT NAME:   Paula Good, Paula Good                  MR #:  [id number]  DATE OF BIRTH:  September 14, 1965                         ACCOUNT #:  [id number]  PHYSICIAN:      Eliezer Champagne, M.D.              ROOM #:  SERVICE:        Urology                            NURSING UNIT:  DICTATED BY:    Eliezer Champagne, M.D.              [facility]:  S  PROCEDURE DATE: 03/03/2005                         ADMIT DATE:  03/03/2005                                                     DISCHARGE DATE:                                     OPERATIVE NOTE     PROCEDURE:     1.  Cystoscopy and bladder hydrodistention.  2.  Examination under general anesthesia.     PREOPERATIVE DIAGNOSIS:     1.  Pelvic pressure.  2.  Microscopic hematuria.     POSTOPERATIVE DIAGNOSIS:     1.  Possible interstitial cystitis and pelvic mass.     SURGEON:  Eliezer Champagne, M.D.     ANESTHESIA:  General.     INDICATIONS AND COMMENT:  This is a 39 year old lady who is a one pack per  day smoker.  She has been having pelvic pressure.  She denies having urinary  frequency, nocturia, or frank hematuria but she was found to have microscopic  hematuria.  She has had some studies done in the past but recent studies have  all been negative.  It was then suspected that she could have either a  bladder tumor or some abnormality of the bladder requiring cystoscopy and  further evaluation.     DETAILS OF PROCEDURE:  The patient was placed in the dorsal lithotomy  position after adequate general anesthesia was induced.  The genitalia was  then prepped and draped.  A cystoscopy was carried out.  Note normal bladder  mucosa all around.  There was a bulge at the posterior bladder wall, which  was a result of the uteri impression.  Inspection of the bladder  all around  was negative for any abnormality.  Orifices were  normal in appearance.  Both  were effluxing clear urine.  Ureter appeared to be normal as well.  Gradual  hydrodistention of the bladder was carried and then the bladder content was  emptied.  Glomerulation of the posterior bladder wall and trigone was noted,  which suggests possibility of early interstitial cystitis but this is not a  confirmatory test for the above condition.  After the above was completed the  scope was removed.  Pelvic examination was then carried out and noted was a  somewhat retroverted uterus but no abnormality of the cervix was felt.     IMPRESSION:     1.  Voiding dysfunction with microscopic hematuria.     PLAN:     1.  The patient will be scheduled for an IVP.  2.  Then will be referred to her gynecologist.                                                       ________________________________________  AH/da                                 ____  D:  03/03/2005 09:13                  Eliezer Champagne, M.D.  T:  03/05/2005 01:25  Job #:  [id number]                                        OPERATIVE NOTE                                        COPY                   Page    1 of 1    Note: An exclamation mark (!) indicates a result that was not dispersed into   the flowsheet.  Document Creation Date: 03/05/2005 1:26 AM  _______________________________________________________________________    (1) Order result status: Final  Collection or observation date-time: 03/03/2005 00:00  Requested date-time:   Receipt date-time:   Reported date-time:   Referring Physician: Referring Nonstaff  Ordering Physician:  Reviewed In [facility])  Specimen Source:   Source: DBS  Filler Order Number: [id number]  Lab site:

## 2005-03-13 NOTE — Unmapped (Signed)
Signed by Eliezer Champagne MD on 03/13/2005 at 09:11:25    UROLOGY OFFICE VISIT    HISTORY OF PRESENT ILLNESS   Reason for Visit: Post op  She has negative IVP and negative custo. Advised to see GYN.  POST-OP VISIT   Procedure Cystoscopy and bladder hydrodistention,  Examination under general anesthesia  Date of Surgery 03/03/2005  10 Days Post Operative      Details:  leaking    Past History  Surgical History:  Cholecystectomy: *, Cystoscopy and bladder hydrodistention, Examination under general anesthesia: 03/03/05, Cesarean Section: x3, Liver Biopsy: benign            Review of Systems   General: Denies any specific issues at this time.   Genitourinary: Complains of incontinence. Denies vaginal discharge, day time wetting, dysuria, hematuria, urinary frequency, amenorrhea, menorrhagia, abnormal vaginal bleeding, pelvic pain, nocturia, flank pain, weak stream, precipitance, incomplete empty, retention, enuresis, perineal rash, tea-colored urine.     Allergies  ! CODEINE  Allergy and adverse reaction list reviewed during this update.      Medications   PREDNISONE TABS (PREDNISONE TABS) qd  NEXIUM CPDR (ESOMEPRAZOLE MAGNESIUM CPDR) qd  NAPROSYN TABS (NAPROXEN TABS) qd  CYTOTEC 100 MCG TABS (MISOPROSTOL) qd  MAXZIDE 75-50 MG TABS (TRIAMTERENE-HCTZ) qd  ARAVA 10 MG TABS (LEFLUNOMIDE) qd      Vital Signs   Height: 65 in.    Weight: 155 lbs.     Intake recorded by: Phillip Heal  March 13, 2005 8:56 AM    Physical Examination      Assessment and Plan       Additional Plan  pt. is to be seen periodically if urine remaines suspiciuos .    DISPOSITION:    Return to clinic prn      Test Ordered   Urine Cytology    cc:   Dr. Tyler Aas

## 2006-10-26 NOTE — Unmapped (Signed)
Signed by   LinkLogic on 10/26/2006 at 13:39:02    Appointment status changed to no show by  LinkLogic on 10/26/2006 1:39 PM.    No Show Comments  ----------------  (MOF) GENERAL NEUROLOGY    Appointment Information  -----------------------  Appt Type:         Date:  Friday, October 26, 2006       Time:  1:20 PM for 40 min    Urgency:  Routine    Made By:  Jory Sims MA   To Visit:  Salley Scarlet MD     Reason:  (MOF) GENERAL NEUROLOGY    Appt Comments  -------------  -- 10/26/06 13:39: (DODDSL) NO SHOW --  (MOF) GENERAL NEUROLOGY  HAVING BLACK OUT SPELLS-REF BY DR.KNIGHT  [place]    -- 10/26/06 9:03: (DODDSL) BOOKED --  Routine  at 10/26/2006 1:20 PM for 40 min  (MOF) GENERAL NEUROLOGY  HAVING BLACK OUT SPELLS-REF BY

## 2006-12-21 NOTE — Unmapped (Signed)
Signed by   LinkLogic on 12/21/2006 at 14:25:21    Appointment status changed to no show by  LinkLogic on 12/21/2006 2:25 PM.    No Show Comments  ----------------  (MOF) GENERAL NEUROLOGY    Appointment Information  -----------------------  Appt Type:         Date:  Friday, December 21, 2006       Time:  1:40 PM for 40 min    Urgency:  Routine    Made By:  Berton Lan   To Visit:  Salley Scarlet MD     Reason:  (MOF) GENERAL NEUROLOGY    Appt Comments  -------------  -- 12/21/06 14:25: (DODDSL) NO SHOW --  (MOF) GENERAL NEUROLOGY  BLACK OUT SPELLS REF BY DR KNIGHT    -- 10/26/06 9:23: Art Buff) BOOKED --  Routine  at 12/21/2006 1:40 PM for 40 min  (MOF) GENERAL NEUROLOGY  BLACK OUT SPELLS REF BY DR Terrilee Croak

## 2007-06-20 NOTE — Unmapped (Signed)
Signed by   LinkLogic on 06/20/2007 at 13:31:20    Appointment status changed to no show by  LinkLogic on 06/20/2007 1:31 PM.    No Show Comments  ----------------  (UP) ESTABLISHED PT IN BUILDING    Appointment Information  -----------------------  Appt Type:         Date:  Thursday, June 20, 2007       Time:  1:00 PM for 60 min    Urgency:  Routine    Made By:  Severiano Gilbert   To Visit:  Jovita Gamma MD     Reason:  (UP) ESTABLISHED PT IN BUILDING    Appt Comments  -------------  -- 06/20/07 13:31: (SCOTTLJ) NO SHOW --  (UP) ESTABLISHED PT IN BUILDING  MEMORY IMPAIRMENT, MEMORY LOSS  will bring records    -- 06/10/07 15:16: (FARLEYVL) BOOKED --  Routine  at 06/20/2007 1:00 PM for 60 min  (UP) ESTABLISHED PT IN BUILDING  MEMORY IMPAI
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
## 2007-07-11 NOTE — Unmapped (Signed)
Signed by Jovita Gamma MD on 07/11/2007 at 00:00:00  Patient Data Form      Imported By: Kathlynn Grate 07/18/2007 11:41:33    _____________________________________________________________________    External Attachment:    Please see [employer] EMR for this document.

## 2007-07-11 NOTE — Unmapped (Signed)
Signed by Jovita Gamma MD on 07/11/2007 at 00:00:00  Patient Drawing      Imported By: Kathlynn Grate 07/18/2007 13:07:13    _____________________________________________________________________    External Attachment:    Please see [employer] EMR for this document.

## 2007-07-11 NOTE — Unmapped (Signed)
Signed by Blanch Media CMA on 07/22/2007 at 12:50:19    Clinical Lists Changes  [phone]  Problems:  Added new problem of MEMORY LOSS (ICD-780.93)  Added new problem of DEMYELINATING DISEASE, CENTRAL NERVOUS SYSTEM (ICD-341.9)  Orders:  Added new Test order of B-12  (B12)  (927) ([id number]) - Signed  Added new Test order of Sedimentation Rate   (ESR) (809) (LOV-56433) - Signed  Added new Test order of CMP   (METAPNL) (10231) (IRJ-18841) - Signed  Added new Test order of CBC with Differential   (CBCD) [phone]) ([id number]) - Signed  Added new Test order of Methamolonic Acid (MMA)  (32355) (DDU-20254) - Signed  Added new Test order of TSH   (TSH) (899) (YHC-62376) - Signed  Added new Test order of Free T4 (EGB-15176) - Signed  Added new Test order of Thyroid Peroxidase  (TPO) (1607) ([id number]-06269) - Signed  Added new Test order of HIV-1 Antibody Screen/Reflex to Western Blot [phone]) [phone]) - Signed  Added new Test order of RPR   (syphilis tst qual) (93818) (EXH-37169) - Signed  Added new Test order of Rheumatoid Factor (RF) IgM, IgA, & IgG (67893) (YBO-17510) - Signed  Added new Test order of Other Lab (CHE-52778) - Signed  Added new Test order of Other Lab (EUM-35361) - Signed  Added new Referral order of Psychiatric Consult [phone]) - Signed  Added new Test order of EEG - Routine (QQP-61950) - Signed  Added new Test order of MRI, Cervical spine w/ and w/o gadolinium (DTO-67124) - Signed
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
## 2007-07-22 NOTE — Unmapped (Signed)
Signed by Jovita Gamma MD on 07/22/2007 at 00:00:00  Precertification      Imported By: Coletta Memos 09/25/2007 15:56:37    _____________________________________________________________________    External Attachment:    Please see [employer] EMR for this document.

## 2007-07-24 ENCOUNTER — Inpatient Hospital Stay

## 2007-07-24 LAB — COMPREHENSIVE METABOLIC PANEL
A/G Ratio: 1.2 (ref 1.0–2.1)
ALT: 19 U/L (ref 6–40)
AST: 14 U/L (ref 10–30)
Albumin: 4.2 g/dL (ref 3.6–5.1)
Alkaline Phosphatase: 65 U/L (ref 33–115)
BUN/Creatinine Ratio: 9 (ref 6–22)
BUN: 6 mg/dL — ABNORMAL LOW (ref 7–25)
CO2: 28 mmol/L (ref 21–33)
Calcium: 9.3 mg/dL (ref 8.6–10.2)
Chloride: 104 mmol/L (ref 98–110)
Creatinine: 0.7 mg/dL (ref 0.59–1.07)
GFR MDRD Af Amer: >60 mL/min (ref >=60)
GFR MDRD Non Af Amer: >60 mL/min (ref >=60)
Globulin, Total: 3.5 g/dL (ref 2.2–3.9)
Glucose: 74 mg/dL (ref 65–99)
Potassium: 3.3 mmol/L — ABNORMAL LOW (ref 3.5–5.3)
Sodium: 140 mmol/L (ref 135–146)
Total Bilirubin: 0.4 mg/dL (ref 0.2–1.2)
Total Protein: 7.7 g/dL (ref 6.2–8.3)

## 2007-07-24 LAB — CBC AND DIFFERENTIAL
Basophils Absolute: 29 K/uL (ref 0–200)
Basophils Relative: 0.5 %
Eosinophils Absolute: 110 10*3/mm3 (ref 15–500)
Eosinophils Relative: 1.9 %
Hematocrit: 43.2 % (ref 35.0–45.0)
Hemoglobin: 14.7 g/dL (ref 11.7–15.5)
Lymphocytes Absolute: 1931 10*3/mm3 (ref 850–3900)
Lymphocytes Relative: 33.3 %
MCH: 30.9 pg (ref 27.0–33.0)
MCHC: 34 g/dL (ref 32.0–36.0)
MCV: 90.7 fL (ref 80.0–100.0)
Monocytes Absolute: 516 10*3/microliter (ref 200–950)
Monocytes Relative: 8.9 %
Neutrophils Absolute: 3213 K/uL (ref 1500–7800)
Neutrophils Relative: 55.4 %
Platelets: 224 10*3/mm3 (ref 140–400)
RBC: 4.77 10*6/mm3 (ref 3.80–5.10)
RDW: 13.4 % (ref 11.0–15.0)
WBC: 5.8 10*3/mm3 (ref 3.8–10.8)

## 2007-07-24 LAB — T4, FREE: Free T4: 0.9 ng/dL (ref 0.8–1.8)

## 2007-07-24 LAB — THYROID PEROXIDASE ANTIBODY: Anti-Thyroid Peroxidase Ab: <10 (ref <35)

## 2007-07-24 LAB — SED RATE: Sed Rate: 17 mm/h (ref <=20)

## 2007-07-24 LAB — HIV 1+2 ANTIBODY/ANTIGEN WITH REFLEX: HIV-1/HIV-2 Ab: NONREACTIVE

## 2007-07-24 LAB — ANA COMPREHENSIVE PANEL: ANA Screen, IFA: NEGATIVE

## 2007-07-24 LAB — VITAMIN B12: Vitamin B-12: 389 pg/mL (ref 200–1100)

## 2007-07-24 LAB — TSH: TSH: 1.35 microintl units/mL

## 2007-07-24 LAB — ACETYLCHOLINE RECEPTOR, BINDING: Acetylcholine Receptor Binding Antibody: 0 (ref <=0.02)

## 2007-07-24 LAB — RPR W TITER: RPR: NONREACTIVE

## 2007-07-24 NOTE — Unmapped (Signed)
Signed by Jovita Gamma MD on 07/26/2007 at 09:04:08  Patient: Paula Good  Note: All result statuses are Final unless otherwise noted.    Tests: (1) T4, FREE ([id number])    T4, FREE                  0.9 ng/dL                   1.6-1.0    Note: An exclamation mark (!) indicates a result that was not dispersed into   the flowsheet.  Document Creation Date: 07/24/2007 9:51 PM  _______________________________________________________________________    (1) Order result status: Final  Collection or observation date-time: 07/24/2007 12:18  Requested date-time:   Receipt date-time: 07/24/2007 12:25  Reported date-time: 07/24/2007 21:00  Referring Physician:    Ordering Physician: Jovita Gamma [facility])  Specimen Source: S  Source: Arline Asp Order Number: [id number]  Lab site: [facility] [place]      [address]      [place]  [place]  [id number]

## 2007-07-24 NOTE — Unmapped (Signed)
Signed by Jovita Gamma MD on 07/26/2007 at 10:13:04  Patient: Emmaline Life  Note: All result statuses are Final unless otherwise noted.    Tests: (1) METHYLMALONIC ACID ([id number])  ! METHYLMALONIC ACID, SERUM                              148 nmol/L                  87-318    Note: An exclamation mark (!) indicates a result that was not dispersed into   the flowsheet.  Document Creation Date: 07/26/2007 9:36 AM  _______________________________________________________________________    (1) Order result status: Final  Collection or observation date-time: 07/24/2007 12:18  Requested date-time:   Receipt date-time: 07/24/2007 12:25  Reported date-time: 07/26/2007 09:00  Referring Physician:    Ordering Physician: Jovita Gamma [facility])  Specimen Source: S  Source: Arline Asp Order Number: [id number]  Lab site: [facility]      [address]      [place]  [place]  [address]

## 2007-07-24 NOTE — Unmapped (Signed)
Signed by Jovita Gamma MD on 07/26/2007 at 09:04:08  Patient: Paula Good  Note: All result statuses are Final unless otherwise noted.    Tests: (1) SED RATE BY MODIFIED WESTERGREN (QDL-809)   SED RATE BY MODIFIED WESTERGREN                              17 mm/h                     < OR = 20    Note: An exclamation mark (!) indicates a result that was not dispersed into   the flowsheet.  Document Creation Date: 07/25/2007 5:13 AM  _______________________________________________________________________    (1) Order result status: Final  Collection or observation date-time: 07/24/2007 12:18  Requested date-time:   Receipt date-time: 07/24/2007 12:25  Reported date-time: 07/25/2007 05:00  Referring Physician:    Ordering Physician: Jovita Gamma [facility])  Specimen Source: B  Source: Arline Asp Order Number: [id number]  Lab site: [facility] [place]      [address]      [place]  [place]  [address]

## 2007-07-24 NOTE — Unmapped (Signed)
Signed by Ihor Gully MD on 07/25/2007 at 10:33:16    EEG REPORT      Electroencephalography Report   Requesting Physician: Jovita Gamma, MD  Date of Service: 07/24/2007  EEG Number: 09-    Patient History:   Patient History: 41 y/o RH WF here for a routine EEG. She has a history of episodes. She denies any seizure risk factors.    Current Medications: Metoprolol, Triamterene, Prevacid, Amitiza, Cymbalta, Clonazepam, Adderall, Remicade, Reclast, Bentyl.  Technical Summary:   20 channels of EEG are recorded in a digital format on a patient who is reported to be awake and drowsy during the recording.  Anterior temporal leads are used for this recording. While awake, the background rhythm consisted of  a well developed and well regulated 10 - 11 hertz alpha activity over the posterior head region which is reactive to eye opening and closure.  Photic Simulation: produced no abnormalities  Hyperventilation:  produced no abnormalities  During the recording, the patient became drowsy but did not fall asleep. Comments: Substantial amounts of admixed beta frequencies are seen symmetrically throughout the recording.    EKG: Abnormal observations in the EKG monitoring channel: Frequent PVCs are seen throughout the recording in the EKG monitoring channel.    Interpretation:   This EEG is within normal limits during the awake and drowsy states. No focal, lateralizing or epileptiform abnormalities were seen.    Other: Frequent PVCs are seen throughout the recording in the EKG monitoring channel.    Interpreting Physician: Ihor Gully MD

## 2007-07-24 NOTE — Unmapped (Signed)
Signed by Jovita Gamma MD on 07/26/2007 at 09:04:08  Patient: Paula Good  Note: All result statuses are Final unless otherwise noted.    Tests: (1) RPR (DX) W/REFL TITER AND CONFIRMATORY TESTING ([id number]-11914)   RPR (DX) W/REFL TITER AND CONFIRMATORY TESTING                              NON-REACTIVE                NON-REACTIVE    Note: An exclamation mark (!) indicates a result that was not dispersed into   the flowsheet.  Document Creation Date: 07/25/2007 11:55 AM  _______________________________________________________________________    (1) Order result status: Final  Collection or observation date-time: 07/24/2007 12:18  Requested date-time:   Receipt date-time: 07/24/2007 12:25  Reported date-time: 07/25/2007 11:00  Referring Physician:    Ordering Physician: Jovita Gamma [facility])  Specimen Source: S  Source: Arline Asp Order Number: [id number]  Lab site: [facility] [place]      [address]      [place]  [place]  [address]

## 2007-07-24 NOTE — Unmapped (Signed)
Signed by Jovita Gamma MD on 07/26/2007 at 09:04:08  Patient: Paula Good  Note: All result statuses are Final unless otherwise noted.    Tests: (1) HIV AB, HIV 1/2, EIA, WITH REFLEXES ([id number])   HIV 1/2 EIA AB SCREEN                              NON-REACTIVE                NON-REACTIVE             A NON-REACTIVE HIV 1/2 ANTIBODY RESULT DOES NOT       EXCLUDE HIV INFECTION SINCE THE TIME FRAME FOR       SEROCONVERSION IS VARIABLE. IF ACUTE HIV INFECTION IS       SUSPECTED, ANTIBODY RETESTING AND NUCLEIC ACID       AMPLIFICATION (HIV DNA/RNA) TESTING IS RECOMMENDED.           Note: An exclamation mark (!) indicates a result that was not dispersed into   the flowsheet.  Document Creation Date: 07/25/2007 6:12 AM  _______________________________________________________________________    (1) Order result status: Final  Collection or observation date-time: 07/24/2007 12:18  Requested date-time:   Receipt date-time: 07/24/2007 12:25  Reported date-time: 07/25/2007 06:00  Referring Physician:    Ordering Physician: Jovita Gamma [facility])  Specimen Source: S  Source: Arline Asp Order Number: [id number]  Lab site: [facility] [place]      [address]      [place]  [place]  [address]

## 2007-07-24 NOTE — Unmapped (Signed)
Signed by Jovita Gamma MD on 07/26/2007 at 09:04:08  Patient: Paula Good  Note: All result statuses are Final unless otherwise noted.    Tests: (1) ANACHOICE(TM) CASCADING REFLEX ([id number])    ANACHOICE(TM) SCREEN      NEGATIVE                    NEGATIVE             A NEGATIVE ANACHOICE(TM) CASCADE INDICATES THE ABSENCE       OF DETECTABLE ANTIBODIES TO COMPONENT ANALYTES       CONSISTING OF DSDNA, CHROMATIN, RNP, SM/RNP, SM, SSA,       SSB, JO-1, CENTROMERE B, SCL-70 AND RIBOSOMAL P.              A NEGATIVE ANACHOICE(TM) CASCADE SHOULD BE INTERPRETED       IN THE CONTEXT OF THE CLINICAL AND LABORATORY FINDINGS,       AND DOES NOT RULE OUT AUTOIMMUNE DISEASE CHARACTERIZED      BY OTHER AUTOANTIBODY SPECIFICITIES INCLUDING      AUTOIMMUNE HEPATITIS AND PRIMARY BILIARY CIRRHOSIS.    Note: An exclamation mark (!) indicates a result that was not dispersed into   the flowsheet.  Document Creation Date: 07/25/2007 10:39 AM  _______________________________________________________________________    (1) Order result status: Final  Collection or observation date-time: 07/24/2007 12:18  Requested date-time:   Receipt date-time: 07/24/2007 12:25  Reported date-time: 07/25/2007 10:00  Referring Physician:    Ordering Physician: Jovita Gamma [facility])  Specimen Source: S  Source: Arline Asp Order Number: [id number]  Lab site: [facility] [place]      [address]      [place]  [place]  [address]

## 2007-07-24 NOTE — Unmapped (Signed)
Signed by Jovita Gamma MD on 07/26/2007 at 09:04:08  Patient: Paula Good  Note: All result statuses are Final unless otherwise noted.    Tests: (1) TSH, 3RD GENERATION (QDL-899)    TSH, 3RD GENERATION       1.35 mIU/L      REFERENCE RANGE:              > OR = 20 YEARS: 0.40-4.50                   PREGNANCY RANGES       FIRST TRIMESTER    0.20-4.70       SECOND TRIMESTER   0.30-4.10       THIRD TRIMESTER    0.40-2.70           Note: An exclamation mark (!) indicates a result that was not dispersed into   the flowsheet.  Document Creation Date: 07/24/2007 9:51 PM  _______________________________________________________________________    (1) Order result status: Final  Collection or observation date-time: 07/24/2007 12:18  Requested date-time:   Receipt date-time: 07/24/2007 12:25  Reported date-time: 07/24/2007 21:00  Referring Physician:    Ordering Physician: Jovita Gamma [facility])  Specimen Source: S  Source: Arline Asp Order Number: [id number]  Lab site: [facility] [place]      [address]      [place]  [place]  [address]

## 2007-07-24 NOTE — Unmapped (Signed)
Signed by   LinkLogic on 07/24/2007 at 11:15:18    Appointment status changed to no show by  LinkLogic on 07/24/2007 11:15 AM.    No Show Comments  ----------------  (UP) RECHECK    Appointment Information  -----------------------  Appt Type:         Date:  Wednesday, July 24, 2007       Time:  11:00 AM for 60 min    Urgency:  Routine    Made By:  Blanch Media CMA   To Visit:  EEG TECHNICIAN-UP     Reason:  (UP) RECHECK    Appt Comments  -------------  -- 07/24/07 11:15: (ROBERTJP) NO SHOW --  (UP) RECHECK  f/u    -- 07/16/07 12:54: (SCOTTLJ) BOOKED --  Routine  at 07/24/2007 11:00 AM for 60 min  (UP) RECHECK  f/u

## 2007-07-24 NOTE — Unmapped (Signed)
Signed by Jovita Gamma MD on 07/26/2007 at 09:04:08  Patient: Paula Good  Note: All result statuses are Final unless otherwise noted.    Tests: (1) THYROID PEROXIDASE ANTIBODIES (QDL-5081)   THYROID PEROXIDASE ANTIBODIES                              <10 IU/mL                   <35    Note: An exclamation mark (!) indicates a result that was not dispersed into   the flowsheet.  Document Creation Date: 07/25/2007 12:59 PM  _______________________________________________________________________    (1) Order result status: Final  Collection or observation date-time: 07/24/2007 12:18  Requested date-time:   Receipt date-time: 07/24/2007 12:25  Reported date-time: 07/25/2007 12:00  Referring Physician:    Ordering Physician: Jovita Gamma [facility])  Specimen Source: S  Source: Arline Asp Order Number: [id number]  Lab site: [facility] [place]      [address]      [place]  [place]  [address]

## 2007-07-24 NOTE — Unmapped (Signed)
Signed by   LinkLogic on 07/24/2007 at 15:10:56  Patient: Paula Good  Note: All result statuses are Final unless otherwise noted.    Tests: (1) MRI-C-SPINE W/WO CONTRAST [phone])    Order NotePricilla Handler Order Number: [id number]    Order Note:     *** VERIFIED ***  [facility]  Reason:  DEMYLINATING DISEASE  CENTRAL NERVOUS SYSTEM  MEMORY  LOSS  Dict.Staff: Zachery Conch [phone]    Verified By: Zachery Conch       Ver: 07/24/07   3:10 pm  Exams:  MRI-C-SPINE W/WO CONTRAST    MRI-C-SPINE W/WO CONTRAST  Jul 24, 2007 2:20:12 PM    Clinical Indication:  DEMYELINATING DISEASE  CENTRAL NERVOUS  SYSTEM  MEMORY LOSS    Technique: Standard MR examination of the cervical spine  performed without and with intravenous administration 15 mL  Magnevist.    Findings:    Cervical cord signal is within normal limits.    Sagittal images show mild straightening of the cervical spine  without significant AP subluxation. The cervical discs are  desiccated with multilevel disc height loss most evident at  C5-C6 where there is also mild osteophyte formation.    At the C2-C3 level, and there is no significant abnormality of  the disc or neuroforamina.    At C3-C4, there is a mild noncompressive diffuse disc bulge with  facet arthropathy asymmetric on the right causing no significant  foraminal stenosis.    At the C4-C5 level, there is a diffuse disc bulge causing mild  effacement of ventral subarachnoid space with no posterior cord  compression.    At C5-C6 level, there is a broad-based left central and  preforaminal disc protrusion causing mild effacement of ventral  subarachnoid space and overall mild central stenosis with AP  canal dimension 8 mm. There is mild to moderate narrowing of the  left C6 foramen. The right C6 foramen is patent.    At C6-C7, there is a tiny noncompressive right preforaminal disc  protrusion with patent neuroforamina. At C7-T1, the disc and  neural foramina are unremarkable.    Several perineural  cysts are present within cervical  neuroforamina.    There is no evidence of neck mass or adenopathy on this  examination.    Following gadolinium administration, there is very minimal  reactive enhancement of the C5-C6 endplates. No abnormal  intrathecal enhancement is confirmed.    Several tiny cysts are present within the nasopharynx and  lingual tonsillar tissue.    Impression:    Multilevel cervical degenerative disc disease most significant  the C5-C6 level where there is mild central stenosis and  moderate narrowing of the left C6 foramen.    Other level specific details are discussed above. There is no  evidence of demyelinating process involving the cervical cord.  **** end of result ****    Order Note:   EMR Routing to: Margarita Grizzle, MD - ordering - [id number]    Note: An exclamation mark (!) indicates a result that was not dispersed into   the flowsheet.  Document Creation Date: 07/24/2007 3:10 PM  _______________________________________________________________________    (1) Order result status: Final  Collection or observation date-time: 07/24/2007 13:14:00  Requested date-time: 07/24/2007 13:14:00  Receipt date-time:   Reported date-time: 07/24/2007 15:10:51  Referring Physician: Cindi Carbon NON-STAFF  Ordering Physician: Jovita Gamma [facility])  Specimen Source:   Source: QRS  Filler Order Number: [id number]  Lab site: [facility]

## 2007-07-24 NOTE — Unmapped (Signed)
Signed by Jovita Gamma MD on 07/26/2007 at 09:04:08  Patient: Paula Good  Note: All result statuses are Final unless otherwise noted.    Tests: (1) VITAMIN B12 (QDL-927)    VITAMIN B12               389 pg/mL                   [phone]             PLEASE NOTE: ALTHOUGH THE REFERENCE RANGE FOR VITAMIN      B12 IS [phone] PG/ML, IT HAS BEEN REPORTED THAT BETWEEN      5 AND 10% OF PATIENTS WITH VALUES BETWEEN 200 AND 400      PG/ML MAY EXPERIENCE NEUROPSYCHIATRIC AND HEMATOLOGIC      ABNORMALITIES DUE TO OCCULT B12 DEFICIENCY; LESS THAN 1%      OF PATIENTS WITH VALUES ABOVE 400 PG/ML WILL HAVE SYMPTOMS.    Note: An exclamation mark (!) indicates a result that was not dispersed into   the flowsheet.  Document Creation Date: 07/25/2007 9:28 AM  _______________________________________________________________________    (1) Order result status: Final  Collection or observation date-time: 07/24/2007 12:18  Requested date-time:   Receipt date-time: 07/24/2007 12:25  Reported date-time: 07/25/2007 09:00  Referring Physician:    Ordering Physician: Jovita Gamma [facility])  Specimen Source: S  Source: Arline Asp Order Number: [id number]  Lab site: [facility]       [address]      [place]  [place]  [address]

## 2007-07-24 NOTE — Unmapped (Signed)
Signed by Jovita Gamma MD on 08/05/2007 at 14:59:08  Patient: Paula Good  Note: All result statuses are Final unless otherwise noted.    Tests: (1) PARANEOPLASTIC AUTOANTIBODY EVAL. S [phone])  ! ANNA-1                    Negative titer              <1:240  ! ANNA-2                    Negative titer              <1:240  ! ANNA-3                    Negative titer              <1:240  ! AGNA 1                    Negative titer              <1:240  ! PURKINJE TYPE 1           Negative titer              <1:240  ! PURKINJE TYPE 2           Negative titer              <1:240  ! PURKINJE TYPE TR          Negative titer              <1:240  ! AMPHIPHYSIN               Negative titer              <1:240  ! CRMP-5-IGG                Negative titer      -- EXPECTED VALUES --      Negative at <1:240      Titers lower than 1:240 may be      detectable by recombinant CRMP-5      western blot analysis. CRMP-5      western blot analysis will be done      by request on stored serum (held 4      weeks). This supplemental testing      is recommended in cases of chorea,      vision loss, cranial neuropathy      and myelopathy. Contact [facility]      [facility] In[facility] at [phone]-      [phone] ([phone] [phone])to add-on      CRMP-5-IgG Western Blot, S.  ! STRIATIONAL (STRIATED MUSCLE) AB, S                              Negative titer              <1:60  ! CALCIUM CHANNEL BIND AB, P/Q TYPE                              0.01 nmol/L                 <=0.02  ! CALCIUM CHANNEL BIND AB, N-TYPE  0.00 nmol/L                 <=0.03   ACH RECEPTOR (MUSCLE) BINDING AB                              0.00 nmol/L                 <=0.02  ! ACHR GANGLIONIC NEURONAL AB, S                              0.00 nmol/L                 <=0.02  ! NEURONAL (V-G) K+ CHANNEL AB, S                              0.00 nmol/L                 <=0.02    Note: An exclamation mark (!) indicates a result that was not dispersed into   the  flowsheet.  Document Creation Date: 08/02/2007 10:22 PM  _______________________________________________________________________    (1) Order result status: Final  Collection or observation date-time: 07/24/2007 12:22  Requested date-time:   Receipt date-time: 07/24/2007 18:20  Reported date-time: 08/02/2007 22:00  Referring Physician:    Ordering Physician: Jovita Gamma [facility])  Specimen Source: S  Source: Arline Asp Order Number: [id number]  Lab [facility]      [address] [place]      [place]  [place]  [address]

## 2007-07-24 NOTE — Unmapped (Signed)
Signed by Jovita Gamma MD on 07/26/2007 at 09:03:38  Patient: Paula Good  Note: All result statuses are Final unless otherwise noted.    Tests: (1) COMPREHENSIVE METABOLIC PANEL W/EGFR (QDL-10231)    GLUCOSE                   74 mg/dL                    36-64                  FASTING REFERENCE INTERVAL    UREA NITROGEN (BUN)  [L]  6 mg/dL                     4-03    CREATININE                0.7 mg/dL                   0.59-1.07   eGFR NON-AFR. AMERICAN                              >60 mL/min/1.53m2           > OR = 60   eGFR AFRICAN AMERICAN                              >60 mL/min/1.52m2           > OR = 60   BUN/CREATININE RATIO (calc)                              9                           6-22    SODIUM                    140 mmol/L                  135-146    POTASSIUM            [L]  3.3 mmol/L                  3.5-5.3    CHLORIDE                  104 mmol/L                  98-110    CARBON DIOXIDE            28 mmol/L                   21-33    CALCIUM                   9.3 mg/dL                   4.7-42.5    PROTEIN, TOTAL            7.7 g/dL                    9.5-6.3    ALBUMIN                   4.2 g/dL  3.6-5.1    GLOBULIN (calc)           3.5 g/dL                    1.6-1.0   ALBUMIN/GLOBULIN RATIO (calc)                              1.2                         1.0-2.1    BILIRUBIN, TOTAL          0.4 mg/dL                   9.6-0.4    ALKALINE PHOSPHATASE      65 U/L                      33-115    AST                       14 U/L                      10-30    ALT                       19 U/L                      6-40    Note: An exclamation mark (!) indicates a result that was not dispersed into   the flowsheet.  Document Creation Date: 07/24/2007 9:24 PM  _______________________________________________________________________    (1) Order result status: Final  Collection or observation date-time: 07/24/2007 12:18  Requested date-time:   Receipt date-time: 07/24/2007  12:25  Reported date-time: 07/24/2007 21:00  Referring Physician:    Ordering Physician: Jovita Gamma [facility])  Specimen Source: S  Source: Arline Asp Order Number: [id number]  Lab site: [facility] [place]      [address]      [place]  [place]  [address]

## 2007-07-24 NOTE — Unmapped (Signed)
Signed by Jovita Gamma MD on 07/29/2007 at 09:41:30  Patient: Paula Good  Note: All result statuses are Final unless otherwise noted.    Tests: (1) RHEUMATOID FACTOR (IGA, IGG, IGM) (AVW-09811)  ! RHEUMATOID FACTOR (IGG)                         [H]  43 U                                            Reference Range:                                            <=6  NEGATIVE                                            >6   POSITIVE  ! RHEUMATOID FACTOR (IGA)                         [H]  >100 U                                            Reference Range:                                            <=6  NEGATIVE                                            >6   POSITIVE  ! RHEUMATOID FACTOR (IGM)                         [H]  >100 U                                            Reference Range:                                            <=6 NEGATIVE                                            >6  POSITIVE    Note: An exclamation mark (!) indicates a result that was not dispersed into   the flowsheet.  Document Creation Date: 07/27/2007 9:56 PM  _______________________________________________________________________    (1) Order result status: Final  Collection or observation date-time: 07/24/2007 12:18  Requested date-time:   Receipt date-time: 07/24/2007 12:25  Reported date-time: 07/27/2007 21:00  Referring Physician:    Ordering Physician: Jovita Gamma [facility])  Specimen Source: S  Source: Arline Asp Order Number: [id number]  Lab site: [facility]      [address] [place]  [place]  [address]

## 2007-07-24 NOTE — Unmapped (Signed)
Signed by Jovita Gamma MD on 07/26/2007 at 09:04:08  Patient: Paula Good  Note: All result statuses are Final unless otherwise noted.    Tests: (1) CBC (INCLUDES DIFF/PLT) ([id number])   WHITE BLOOD CELL COUNT                              5.8 Thousand/uL             3.8-10.8    RED BLOOD CELL COUNT      4.77 Million/uL             3.80-5.10    HEMOGLOBIN                14.7 g/dL                   08.6-57.8    HEMATOCRIT                43.2 %                      35.0-45.0    MCV                       90.7 fL                     80.0-100.0    MCH                       30.9 pg                     27.0-33.0    MCHC                      34.0 g/dL                   46.9-62.9    RDW                       13.4 %                      11.0-15.0    PLATELET COUNT            224 Thousand/uL             140-400    ABSOLUTE NEUTROPHILS      3213 cells/uL               1500-7800    ABSOLUTE LYMPHOCYTES      1931 cells/uL               [phone]    ABSOLUTE MONOCYTES        516 cells/uL                200-950    ABSOLUTE EOSINOPHILS      110 cells/uL                15-500    ABSOLUTE BASOPHILS        29 cells/uL                 0-200    NEUTROPHILS               55.4 %    LYMPHOCYTES               33.3 %  MONOCYTES                 8.9 %    EOSINOPHILS               1.9 %    BASOPHILS                 0.5 %    Note: An exclamation mark (!) indicates a result that was not dispersed into   the flowsheet.  Document Creation Date: 07/24/2007 7:38 PM  _______________________________________________________________________    (1) Order result status: Final  Collection or observation date-time: 07/24/2007 12:18  Requested date-time:   Receipt date-time: 07/24/2007 12:25  Reported date-time: 07/24/2007 19:00  Referring Physician:    Ordering Physician: Jovita Gamma [facility])  Specimen Source: B  Source: Arline Asp Order Number: [id number]  Lab site: [facility]      [address]      [place]  [facility] [facility]   [id number]      -----------------    The following lab values were dispersed to the flowsheet  with no units conversion:      WHITE BLOOD CELL COUNT, 5.8 THOUSAND/UL, (F)  expected units: 10*3/mm3    RED BLOOD CELL COUNT, 4.77 MILLION/UL, (F)  expected units: 10*6/mm3    PLATELET COUNT, 224 THOUSAND/UL, (F)  expected units: 10*3/mm3    ABSOLUTE NEUTROPHILS, 3213 CELLS/UL, (F)  expected units: K/uL    ABSOLUTE LYMPHOCYTES, 1931 CELLS/UL, (F)  expected units: 10*3/mm3    ABSOLUTE MONOCYTES, 516 CELLS/UL, (F)  expected units: 10*3/microliter    ABSOLUTE EOSINOPHILS, 110 CELLS/UL, (F)  expected units: 10*3/mm3    ABSOLUTE BASOPHILS, 29 CELLS/UL, (F)  expected units: K/uL

## 2007-07-29 NOTE — Unmapped (Signed)
Signed by Jovita Gamma MD on 07/29/2007 at 18:00:10    [facility]  ___________________________________________________________________________  Office Visit    History of Present Illness   [name] returns in the company of her husband to review the results of her evaluation.  We reviewed the results from her laboratory investigations. These were normal or negative, with the exception of an elevated rheumatoid factor.    MRI of eth cervical spine was normal.    We reviewed that her EEG identified an abnormality in the EKG tracing. She was advised to follow-up this finding with her primary care physician.               Past History  Past Medical History (reviewed - no changes required):  Hypertension, GERD, Hematuria, Anemia, Rheumatoid Arthritis, Non-Alcoholic  Cirrhosis of the Liver  IBS    Surgical History (reviewed - no changes required):  Cholecystectomy: *, Cystoscopy and bladder hydrodistention, Examination under general anesthesia: 03/03/05, Cesarean Section: x3, Liver Biopsy: benign    Family History (reviewed - no changes required): Father - Father  MGM - Alzheimer's Disease  Social History (reviewed - no changes required): Marital Status: married,   Children: 3,   Employment Status: employed part-time,   Occupation: Medical laboratory scientific officer  Alcohol Use: none  Tobacco Usage:smoker  Cigarettes-Packs per Day- 1,       Additional Histories - Past Medical, Family & Social    Family History - Her MGM had Alzheimer's disease, onset around the late 46s. There is no other family history of Alzheimer's disease or other dementias.    Education: Ms. Markel had over 18 years of formal education.  Smoking: Current smoker.  Alcohol: There is no history of alcoholism.   Driving: She stopped driving about three months ago.  Her husband manages the finances. She manages her own medications, with assistance from her husband.     Review of Systems       Vital Signs   Height: 65 in.  Weight: 160 lbs. Pulse rate: 64  Pulse rhythm: regular Respirations: 16    Blood Pressure   BP #1: 124 / 68mm Hg  Cuff Size: Std     Allergies  ! CODEINE    Medications on Intake:    METOPROLOL TARTRATE 25 MG  TABS (METOPROLOL TARTRATE) 1 tab by mouth qd  TRIAMTERENE-HCTZ 37.5-25 MG  TABS (TRIAMTERENE-HCTZ) 1 tab by mouth bid  PREVACID 30 MG  CPDR (LANSOPRAZOLE) 1 tab by mouth qd  AMITIZA 24 MCG  CAPS (LUBIPROSTONE) 1 tab by mouth bid  CYMBALTA 60 MG  CPEP (DULOXETINE HCL) 1 tab by mouth qd  CLONAZEPAM 2 MG  TABS (CLONAZEPAM) 1 tab by mouth bid  ADDERALL XR 20 MG  CP24 (AMPHETAMINE-DEXTROAMPHETAMINE) 2 tabs by mouth qd  MILK THISTLE 175 MG  CAPS (MILK THISTLE) 1 tab by mouth qd  CALCIUM 500   TABS (CALCIUM-MAGNESIUM-VITAMIN D TABS) 1 tab by mouth tid  REMICADE 100 MG  SOLR (INFLIXIMAB) 1x/month  RECLAST 5 MG/100ML  SOLN (ZOLEDRONIC ACID) 1x/year  BENTYL 20 MG  TABS (DICYCLOMINE HCL) prn  BASE D POLYETHYLENE GLYCOL   POWD (POLYETHYLENE GLYCOL 4500) prn        Physical Examination          Assessment and Plan     Assessment   Possible demyelinating disease  Cognitive inefficiency  Spells  Personal history of rheumatoid arthritis  Depression  Anxiety  Diagnosis of attention-deficit disorder  PTSD    This is  obviously a complex clinical picture. Mrs. Lecuyer  brain MRI identifies a number of regions of T2 signal abnormality. These could represent demyelinating lesions, small regions of stroke, or small regions of gliosis. She carries a diagnosis of rheumatoid arthritis, which can bas associated with an increased risk of stroke. She is also on Remicade, which has been associated with demyelinating events. She has not had clinical symptoms suggesting multiple sclerosis and I do not find evidence to support a diagnosis of multiple sclerosis at this time.     She presented for evaluation of amnestic/dissociative spells. These do not sound suggestive of a neurodegenerative disorder or of haing a neurological origin. We again discussed the importance of  exploring hier psychlogical and psychiatric history to enable her to better enjoy her quality of life.    We also discussed that her complex psychiatric history and multiple psychoactive medications further complicates the clinical picture.       Plan   1. Follow-through with psychiatry/psychology referral to [facility] or to other provider per her preference  2. Follow-up witH PCP regarding EKG findings from her EEG tracing  3. Follow-up in 1 year with brain MRI    Greater than half of this 25 minute office visit was spent counselling the patient and family regarding  the MRI, laboratory and EEG findings and the assessment and plan as outlined above.            ]

## 2007-07-30 NOTE — Unmapped (Signed)
Signed by Jovita Gamma MD on 07/31/2007 at 08:18:30    [facility]           [facility]    Healing ??? Teaching ??? Leading     [address]  [place], [place] [address]  Ph:   [phone]  Fax: [phone]        Jul 30, 2007      Salvadore Farber, M.D.        RE: Paula Good   DOB:  December 25, 1965    Dear Dr. Glean Hess:    I had the pleasure of seeing your patient, Paula Good, in consultation in the [facility] at [facility] on 07/29/2007.     History of Present Illness:  Paula Good returns in the company of her husband to review the results of her evaluation.  We reviewed the results from her laboratory investigations. These were normal or negative, with the exception of an elevated rheumatoid factor.    MRI of the cervical spine was normal.    We reviewed that her EEG identified an abnormality in the EKG tracing. She was advised to follow-up this finding with her primary care physician.    Assessment   Possible demyelinating disease  Cognitive inefficiency  Spells  Personal history of rheumatoid arthritis  Depression  Anxiety  Diagnosis of attention-deficit disorder  PTSD    This is obviously a complex clinical picture. Paula Good  brain MRI identifies a number of regions of T2 signal abnormality. These could represent demyelinating lesions, small regions of stroke, or small regions of gliosis. She carries a diagnosis of rheumatoid arthritis, which can bas associated with an increased risk of stroke. She is also on Remicade, which has been associated with demyelinating events. She has not had clinical symptoms suggesting multiple sclerosis and I do not find evidence to support a diagnosis of multiple sclerosis at this time.     She presented for evaluation of amnestic/dissociative spells. These do not sound suggestive of a neurodegenerative disorder or of having a neurological origin. We again discussed the importance of addressing her psychological and psychiatric  history to enable her to better enjoy her quality of life.    We also discussed that her complex psychiatric history and multiple psychoactive medications further complicates the clinical picture.       Plan   1. Follow-through with psychiatry/psychology referral to [facility] or to other provider per her preference  2. Follow-up witH PCP regarding EKG findings from her EEG tracing  3. Follow-up in 1 year with brain MRI    Greater than half of this 25 minute office visit was spent counselling the patient and family regarding  the MRI, laboratory and EEG findings and the assessment and plan as outlined above.    Thank you for allowing me to participate in the care of this pleasant patient.  If you would like a copy of my office note, please contact our Medical Records department at [phone].  Please feel free to contact me should you have any concerns or questions.    Sincerely,      Jovita Gamma, MD

## 2007-10-25 NOTE — Unmapped (Signed)
Signed by Generic  [facility] Provider on 10/25/2007 at 00:00:00  [facility] Record      Imported By: Coletta Memos 10/28/2007 13:31:02    _____________________________________________________________________    External Attachment:    Please see [facility] EMR for this document.

## 2007-10-28 NOTE — Unmapped (Signed)
Signed by Generic  [facility] Provider on 10/28/2007 at 00:00:00  [facility] Record Endoscopy      Imported By: Kathlynn Grate 10/28/2007 13:27:41    _____________________________________________________________________    External Attachment:    Please see [facility] EMR for this document.

## 2008-03-17 LAB — CBC WITH AUTO DIFFERENTIAL
Basophils %: 1 % (ref 0.0–2.0)
Basophils Absolute: 0.1 10*3 (ref 0.0–0.2)
Eosinophils %: 2 % (ref 0.0–5.0)
Eosinophils Absolute: 0.1 10*3 (ref 0.0–0.6)
Granulocyte Absolute Count: 3.5 10*3 (ref 1.7–7.7)
Hematocrit: 42 % (ref 36.0–48.0)
Hemoglobin: 14.1 g/dL (ref 12.0–16.0)
Lymphocytes %: 39.7 % (ref 25.0–40.0)
Lymphocytes Absolute: 2.7 10*3 (ref 1.0–5.1)
MCH: 31.2 pg (ref 26–34)
MCHC: 33.7 g/dL (ref 31–36)
MCV: 92.8 fl (ref 80–100)
MPV: 8.6 fl (ref 5.0–10.5)
Monocytes %: 5.6 % (ref 0.0–8.0)
Monocytes Absolute: 0.4 10*3 (ref 0.0–0.95)
Platelets: 256 10*3 (ref 135–450)
RBC: 4.53 10*6 (ref 4.0–5.2)
RDW: 12.7 % (ref 11.5–14.5)
Segs Relative: 51.7 % (ref 42.0–63.0)
WBC: 6.8 10*3 (ref 4.0–11.0)

## 2008-03-17 LAB — SEDIMENTATION RATE, MANUAL: Sed Rate, Automated: 25 mm/h — ABNORMAL HIGH (ref 0–20)

## 2008-03-17 LAB — C-REACTIVE PROTEIN: wr-CRP: 0.6 mg/L (ref <5.1)

## 2008-05-28 ENCOUNTER — Inpatient Hospital Stay
Admit: 2008-05-28 | Discharge: 2008-05-28 | Disposition: A | Discharge status: Home / Self Care | Attending: Emergency Medicine

## 2008-05-28 LAB — URINALYSIS
Bilirubin, Urine: NEGATIVE
Blood, Urine: NEGATIVE
Glucose, UA: NEGATIVE
Ketones, Urine: NEGATIVE
Leukocyte Esterase, Urine: NEGATIVE
Nitrite, Urine: NEGATIVE
Protein, UA: NEGATIVE
Specific Gravity, UA: <=1.005 (ref 1.003–1.030)
Urobilinogen, Urine: 0.2 EU/dl (ref <2.0)
pH, UA: 6 (ref 4.5–8.0)

## 2008-05-28 MED ORDER — HYDROMORPHONE HCL 2 MG/ML IJ SOLN
2 | INTRAMUSCULAR | Status: AC
Start: 2008-05-28 — End: ?

## 2008-05-28 MED ORDER — PROMETHAZINE HCL 25 MG/ML IJ SOLN
25 | INTRAMUSCULAR | Status: AC
Start: 2008-05-28 — End: ?

## 2008-05-28 MED ADMIN — promethazine (PHENERGAN) injection 25 mg: 25 mg | INTRAMUSCULAR | @ 20:00:00 | NDC 00641092821

## 2008-05-28 MED ADMIN — HYDROmorphone (DILAUDID) injection 2 mg: 2 mg | INTRAMUSCULAR | @ 20:00:00 | NDC 00409131230

## 2008-05-28 MED FILL — PROMETHAZINE HCL 25 MG/ML IJ SOLN: 25 MG/ML | INTRAMUSCULAR | Qty: 1

## 2008-05-28 MED FILL — HYDROMORPHONE HCL 2 MG/ML IJ SOLN: 2 MG/ML | INTRAMUSCULAR | Qty: 1

## 2008-05-28 NOTE — Discharge Instructions (Signed)
Sciatica   WHAT YOU SHOULD KNOW:    Sciatica is a condition where there is pain along the sciatic nerve. This occurs when the sciatic nerve is compressed, inflamed (swollen), irritated, or stretched anywhere along its length. Sciatica may be a symptom of other diseases, such as spinal and neurological (nerve) diseases. It may be related to certain activities, poor posture, or physical or psychological (mental) stress. The most common cause of sciatica is a slipped disc in the spine. Other causes include too much use of buttock or leg muscles, being overweight or pregnant, infections, and tumors.     Sciatica usually starts in the lower back and goes down the buttocks and thighs. There may be numbness or problems moving or controlling the leg. A simple leg raise test, bone scan, or x-ray may be done to diagnose sciatica. You may also need an electromyography (EMG), myelogram, computerized tomography (CT) scan, or magnetic resonance imaging (MRI). Treatment may include bed rest, lifestyle changes, assistive devices, medicines, massage, surgery, and physical therapy. With treatment of sciatica, you have a greater chance of the condition going away.   AFTER YOU LEAVE:   Medicines:    Keep a written list of the medicines you take, the amounts, and when and why you take them. Bring the list of your medicines or the pill bottles when you see your caregivers. Learn why you take each medicine. Ask your caregiver for information about your medicine. Do not use any medicines, over-the-counter drugs, vitamins, herbs, or food supplements without first talking to caregivers.     Always take your medicine as directed by caregivers. Call your caregiver if you think your medicines are not helping or if you feel you are having side effects. Do not quit taking your medicines until you discuss it with your caregiver. If you are taking medicine that makes you drowsy, do not drive or use heavy equipment.    Nonsteroidal  anti-inflammatory medicine: This family of medicine is also called NSAIDs. Nonsteroidal anti-inflammatory medicine may help decrease pain and inflammation (swelling). Some NSAIDs may also be used to decrease a high body temperature (fever). This medicine can be bought with or without a doctor's order. This medicine can cause stomach bleeding or kidney problems in certain people. Always read the medicine label and follow the directions on it before using this medicine.     Ask your caregiver when to return for a follow-up visit. Keep all appointments. Write down any questions you may have. This way you will remember to ask these questions during your next visit.  Activity:    Rest: You may need to rest for no more than two days. As pain decreases, you may start doing your normal activities. This will help you to recover and heal faster.     Sports and exercise: Talk to your caregiver before you start exercising. Together you can plan an exercise program that best suits you. Start with a low-impact sport, such as walking or swimming, and do more as you get stronger.     Walking: You may need to use a cane, walker or crutches. This may help you get around and decrease your chances of falling or getting hurt. It is important to use your cane, walker, or crutches correctly. Ask your caregiver for more information about how to choose and use them correctly.   Binder or brace care: Caregivers may tell you to wear a binder or brace to support your back. It may also be used to decrease  pain. Ask your caregiver for more information on binder or brace care.  Cold or warm compress:    Ice pack: An ice pack may be applied to your skin on top of the swollen part to decrease swelling. An ice pack is made of crushed or cubed ice in a plastic bag or an ice pack. Some water is mixed in a bag with the ice to more evenly spread the cold. Wrap the ice bag in a towel. Do not place the ice bag directly on the skin.     Warm pack:  After applying a cold compress, apply a warm, wet washcloth, a heating pad (turned on low), or a hot water bottle. Sitting in a warm water bath or whirlpool may also help.   Do not leave the ice or warm pack on your skin for a long time. Leaving ice on for too long can cause frostbite, and leaving heat on for too long can burn your skin. Ask your caregiver for more information on how to apply cold or warm compresses correctly.   Physical therapy: Your caregiver may want you to go to physical therapy. A physical therapist will help you with special exercises. These exercises help make your bones and muscles stronger.   Self-care:    Avoid too much pressure on your back and legs: Do not lift heavy objects or stand or sit for long periods of time.     Have good lifting techniques: Keep your back straight while bending your knees when picking up an object.             Maintain proper posture: Always sit in a straight-backed chair with your feet flat on the floor.     Sleep on a good, firm mattress: Try placing a piece of plywood under your mattress to help make it firmer. If you do not have a firm mattress, you may also sleep on the floor for a few days.   Wellness hints:    Quit smoking: It is never too late to quit smoking. Smoking harms your body in many ways. You are more likely to have heart disease, lung disease, cancer, and other health problems if you smoke. Quitting smoking will improve your health and the health of those around you. Ask your caregiver for more information about how to stop smoking if you are having trouble quitting.     Stress: Stress may slow healing and cause illness later. Since it is hard to avoid stress, learn to control it. Learn new ways to relax, such as deep breathing, meditation, relaxing muscles, music, or biofeedback. Talk to someone about things that upset you.      Weight: Weighing too much can make your back problems worse, your heart work harder, and cause other health  problems. You may need to talk to your caregiver about a weight loss plan.   For more information: Having sciatica may be a life-changing condition for you and your family. Accepting that you have sciatica may be hard. You and those around you may feel sad, frightened, and anxious. These feelings are normal. Talk to your caregiver, family, or friends about your feelings. Contact the following for more information about sciatica:   [employer]  [address] [place] [address]  Phone: [phone]  Web Address: http://www.AromatherapyParty.no   [employer]  [place], [place] [id number]  Phone: [phone]  Web Address: http://www.aaos.org/  CONTACT A CAREGIVER  IF:    You have a fever (increased body temperature).      You have a low back pain at night or even when resting.     You have low back pain with numbness below the knee.     You have weakness on only one leg.     You have questions or concerns about your condition, care, or medicine.   SEEK CARE IMMEDIATELY IF:    You have trouble holding back your urine or bowel movements.     You have weakness on both legs.     You have numbness in your groin (between your legs) or buttocks (rear).     You are a female, and you cannot get an erection.   Copyright  2009. [employer]. All rights reserved. Information is for End User's use only and may not be sold, redistributed or otherwise used for commercial purposes.  The above information is an educational aid only. It is not intended as medical advice for individual conditions or treatments. Talk to your doctor, nurse or pharmacist before following any medical regimen to see if it is safe and effective for you.

## 2008-05-28 NOTE — ED Provider Notes (Signed)
The history is provided by the patient.     This patient presents with a two day history of right groin and inguinal pain, sharp and worse with movement, no recent injury.  Patient had a serious traumatic injury 14 months ago when she was thrown from a horse and fractured her pelvis at that time.  She has not had pain like this since that injury..  Patient denies fever or chills, chest pain or shortness of breath, abdominal pain, nausea vomiting, urinary symptoms, vaginal discharge, leg swelling.    Review of Systems   All other systems reviewed and are negative.          Physical Exam   Constitutional: She is oriented to person, place, and time. She appears well-developed and well-nourished.   Neck: Normal range of motion. Neck supple.   Pulmonary/Chest: Effort normal and breath sounds normal.   Abdominal: Soft. No tenderness. She has no rebound and no guarding.   Musculoskeletal:        Legs:  Neurological: She is alert and oriented to person, place, and time. She has normal reflexes.   Skin: Skin is warm.         Procedures      MDM  Number of Diagnoses or Management Options  Groin Pain:   Sciatica:     Patient presents with acute right groin pain without injury, x-ray and urinalysis are normal, the pains appear to be musculoskeletal and is worse with movement.  I see no evidence of kidney stone, intraabdominal process, acute vascular  occlusion or deep venous thrombosis.  Patient has no lumbar disk disease and this may be a sciatic variant.  Will treat with pain medicine and encourage her to follow-up with her family physician and Dr. Zadie Rhine her neurosurgeon.    Labs      Radiology      EKG Interpretation

## 2008-06-16 LAB — TSH
T4 Free: 0.79 ng/dL — ABNORMAL LOW (ref 0.9–1.8)
TSH: 1.54 u[IU]/mL (ref 0.35–5.5)

## 2008-06-16 LAB — T4, FREE: T4 Free: 0.79 ng/dL — ABNORMAL LOW (ref 0.9–1.8)

## 2008-07-08 NOTE — Unmapped (Signed)
Signed by Blanch Media CMA on 07/08/2008 at 12:36:29    ---- Converted from flag ----  ---- 07/29/2007 1:18 PM, Blanch Media CMA wrote:  call pt  sched 1 yr f/u and mri brain [place]. around 07/28/08  ------------------------------  PHONE NOTE    PHONE NOTE  Call placed to: Patient  Summary of call: called and left a message on her VM to call and schedule these test.  Call placed by: Blanch Media CMA,  July 08, 2008 12:36 PM

## 2008-07-15 NOTE — Unmapped (Signed)
Signed by Blanch Media CMA on 07/17/2008 at 13:43:06    PHONE NOTE  Call back at Home Phone: [phone]  Caller: patient  Department: Neurology  Call for: Dr Nicholaus Bloom    Reason for Call: She made appt on 09/07/08 and wants to know if any tests are needed before appt.  Please call her @ [phone]      Initial call taken by: Laney Pastor,  July 15, 2008 2:30 PM      FOLLOW UP  called pt and lefta message that she needs to have a repeat brain MRI before her next appt.  i asked that she call back and let me know dates/times that she is avail or if she preferred I could schedule MRI the same day as her f/u appt with dr Nicholaus Bloom.  Follow-up by:  Blanch Media CMA,  July 17, 2008 10:48 AM    FOLLOW UP  pt returned call, call her at [phone]  Follow-up by:  Juliette Alcide Rippy,  July 17, 2008 1:37 PM    FOLLOW UP  pt transfered to call center to get MRI scheduled.  Follow-up by:  Blanch Media CMA,  July 17, 2008 1:42 PM

## 2008-07-20 LAB — CREATININE, SERUM
Creatinine: 0.9 mg/dL (ref 0.59–1.07)
GFR MDRD Af Amer: >60 mL/min (ref >=60)
GFR MDRD Non Af Amer: >60 mL/min (ref >=60)

## 2008-07-20 LAB — BUN: BUN: 12 mg/dL (ref 7–25)

## 2008-07-20 NOTE — Unmapped (Signed)
Signed by Blanch Media CMA on 07/20/2008 at 13:36:39    Clinical Lists Changes    Orders:  Added new Test order of BUN   (BUN) (294) ([id number]) - Signed  Added new Test order of Creatinine w/ GFR (CREAT) (375) Marland [id number]) - Signed      Process Orders  Check Orders Results:      EMR Link Lab: ABN not required for this insurance  Not all tests in this order have been sent for requisitioning  Pending tests not yet sent for requisitioning:      07/20/2008: EMR Link Lab -- BUN   (BUN) (294) [[id number]] (signed)      07/20/2008: EMR Link Lab -- Creatinine w/ GFR (CREAT) (375) [*[id number]] (signed)

## 2008-07-20 NOTE — Unmapped (Signed)
Signed by Jovita Gamma MD on 07/21/2008 at 08:24:27  Patient: Paula Good  Note: All result statuses are Final unless otherwise noted.    Tests: (1) UREA NITROGEN (BUN) (QDL-294)    UREA NITROGEN (BUN)       12 mg/dL                    4-54    Note: An exclamation mark (!) indicates a result that was not dispersed into   the flowsheet.  Document Creation Date: 07/20/2008 11:15 PM  _______________________________________________________________________    (1) Order result status: Final  Collection or observation date-time: 07/20/2008 15:36  Requested date-time:   Receipt date-time: 07/20/2008 15:40  Reported date-time: 07/20/2008 23:00  Referring Physician:    Ordering Physician: Jovita Gamma [facility])  Specimen Source: S  Source: Arline Asp Order Number: [id number]  Lab site: [facility] [place]      [address]      [place]  [place]  [id number]

## 2008-07-20 NOTE — Unmapped (Signed)
Signed by Blanch Media CMA on 07/21/2008 at 17:10:32    Clinical Lists Changes    Problems:  Removed problem of DEMYELINATING DISEASE, CENTRAL NERVOUS SYSTEM (ICD-341.9)  Removed problem of MEMORY LOSS (ICD-780.93)  Orders:  Added new Test order of MRI, Brain w/ & w/o contrast ([id number]) - Signed

## 2008-07-20 NOTE — Unmapped (Signed)
Signed by Jovita Gamma MD on 07/21/2008 at 08:24:27  Patient: Emmaline Life  Note: All result statuses are Final unless otherwise noted.    Tests: (1) CREATININE W/EGFR ([id number])    CREATININE                0.9 mg/dL                   0.59-1.07   eGFR NON-AFR. AMERICAN                              >60 mL/min/1.17m2           > OR = 60   eGFR AFRICAN AMERICAN                              >60 mL/min/1.34m2           > OR = 60    Note: An exclamation mark (!) indicates a result that was not dispersed into   the flowsheet.  Document Creation Date: 07/20/2008 11:15 PM  _______________________________________________________________________    (1) Order result status: Final  Collection or observation date-time: 07/20/2008 15:36  Requested date-time:   Receipt date-time: 07/20/2008 15:40  Reported date-time: 07/20/2008 23:00  Referring Physician:    Ordering Physician: Jovita Gamma [facility])  Specimen Source: S  Source: Arline Asp Order Number: [id number]  Lab site: [facility] [place]      [address]      [place]  [place]  [id number]

## 2008-07-21 NOTE — Unmapped (Signed)
Signed by Jovita Gamma MD on 07/21/2008 at 00:00:00  [facility] Number Confirmation for Exam Scheduling       Imported By: Kathlynn Grate 07/31/2008 12:16:23    _____________________________________________________________________    External Attachment:    Please see [employer] EMR for this document.

## 2008-07-22 ENCOUNTER — Inpatient Hospital Stay

## 2008-07-22 NOTE — Unmapped (Signed)
Signed by   [employer] on 07/27/2008 at 11:17:20  Patient: Paula Good  Note: All result statuses are Final unless otherwise noted.    Tests: (1) MRI-HEAD W/WO CONTRAST [phone])    Order Note: [employer] Accession Number: [id number]    Order Note:                                 Dinah Beers     Patient: Paula Good   DOB:     [birth year]   MRN:     [id number]   FIN:       [id number]   Accn#:   [id number]                               Magnetic Resonance Imaging     Exam                       Exam Date/Time       Ordering Physician   MRI-HEAD W/WO CONTRAST     07/22/2008 16:36 EDT Margarita Grizzle     Reason for Exam   demyelinating disease  memory loss spells     Addendum   Addendum:     In comparison to the prior MR from [facility] dated 11/23/2006, the   ventricles are stable in size and position. There appears to be some   compression and image distortion on the submitted CD browser which does   create some difficulty in precise comparison. Significant differences in   positioning, slice selection, and protocol are also present. The overall   pattern of white matter signal alterations is very similar, and the left   lateral temporal lobe remote insult is unchanged. Subtle changes could not   be assessed given the limitations discussed above.   ********** VERIFIED REPORT **********     Dictated: 07/27/2008 11:12 am     UDSTUEN MD, York Cerise   Signed (Electronic Signature):  Barton Fanny MD, York Cerise             07/27/08   11:16     Technologist: TDH     Report    MRI-HEAD W/WO CONTRAST  Jul 22, 2008 4:36:25 PM     Clinical Indication:  demyelinating disease  memory loss spells     Technique:     Waddell protocol MR examination of the brain performed without and with   intravenous administration 14 mL Omniscan.     Findings:     The ventricles are normal in size and position with no evidence of   intracranial mass effect or abnormal enhancement.     Nonspecific relatively numerous white matter signal  alterations are   present, which are generally rounded and scattered throughout the   subcortical and deep white matter of both frontal and parietal lobes. No   significant temporal lobe signal alterations are present. No significant   ovoid periventricular lesions are seen. No brainstem or cerebellar white   matter signal alterations are present.     A small focus of encephalomalacia and gliosis involves the lateral margin   of the left temporal lobe which may be related to remote trauma.     There is partial opacification of the left maxillary sinus which may be due   to mucus retention cysts or inflammatory mucosal thickening. There is trace   mucosal thickening  in the right posterior ethmoid region. Minimal mucosal   thickening involves the left mastoid air cells.     Sagittal images show normal appearance of the pituitary gland, optic   chiasm, and cerebellar tonsils.     The orbits, paranasal sinuses, skull base and mastoid regions as well as   intracranial flow-voids are otherwise unremarkable.     No suspicious marrow signal alterations are present.     There is no evidence of restricted diffusion.     Impression:     Relatively numerous matter signal alterations as discussed above, abnormal   for the patient's age but very nonspecific. The differential diagnosis   includes accelerated small vessel ischemic disease, previous inflammatory   process, but a less typical pattern of demyelinating disease cannot be   excluded if clinically proven.      Report revised on 07/27/2008 11:16 EDT by Barton Fanny MD, York Cerise     ********** VERIFIED REPORT **********     Dictated: 07/22/2008 4:52 pm      UDSTUEN MD, York Cerise   Signed (Electronic Signature):  Barton Fanny MD, York Cerise             07/22/08   4:54     Technologist: TDH    Order Note:   EMR Routing to: Margarita Grizzle - ordering - [id number]    Note: An exclamation mark (!) indicates a result that was not dispersed into   the flowsheet.  Document Creation Date: 07/27/2008  11:17 AM  _______________________________________________________________________    (1) Order result status: Final  Collection or observation date-time: 07/22/2008 16:36:15  Requested date-time: 07/22/2008 15:37:00  Receipt date-time:   Reported date-time: 07/27/2008 11:16:34  Referring Physician: Jovita Gamma  Ordering Physician: Jovita Gamma [facility])  Specimen Source: RAD&Rad Type  Source: QRS  Filler Order Number: [id number]-OIF  Lab site: [facility]

## 2008-08-03 NOTE — Unmapped (Signed)
Signed by Jovita Gamma MD on 08/07/2008 at 10:07:43    [facility]  ___________________________________________________________________________  Office Visit    History of Present Illness   Paula Good returns in the company of her husband to review the results of her evaluation.      She contineus to be very aggravated by her memory difficulties. She has re-arranged her life to minimize the impact of her PTSD on her life and has changed her social circle to avoid people who may trigger disconcerting memories. She is quite sensitive to alcohol, and finds this has a profound effect on her functioning.        MRI: Brain MRI again identified numerous regions of T2 signal abnormality most apparent on FLAIR imaging. Compared to the previous brain MRI, the appearance is stable. Official report is available in electronic medical record.           Additional Histories - Past Medical, Family & Social    Family History - Her MGM had Alzheimer's disease, onset around the late 84s. There is no other family history of Alzheimer's disease or other dementias.    Education: Paula Good had over 18 years of formal education.  Smoking: Current smoker.  Alcohol: There is no history of alcoholism.   Driving: She stopped driving about three months ago.  Her husband manages the finances. She manages her own medications, with assistance from her husband.         Vital Signs   Height: 65 in.  Weight: 150 lbs. Respirations: 16    Blood Pressure   BP #1: 124 / 76mm Hg  Cuff Size: Std     Allergies  ! CODEINE    Medications on Intake:    METOPROLOL TARTRATE 25 MG  TABS (METOPROLOL TARTRATE) 1 tab by mouth qd  TRIAMTERENE-HCTZ 37.5-25 MG  TABS (TRIAMTERENE-HCTZ) 1 tab by mouth qd  PREVACID 30 MG  CPDR (LANSOPRAZOLE) 1 tab by mouth qd  AMITIZA 24 MCG  CAPS (LUBIPROSTONE) 1 tab by mouth bid  CLONAZEPAM 2 MG  TABS (CLONAZEPAM) 1 tab by mouth bid  ADDERALL XR 20 MG  CP24 (AMPHETAMINE-DEXTROAMPHETAMINE) 2 tabs by mouth qd  CALCIUM 500    TABS (CALCIUM-MAGNESIUM-VITAMIN D TABS) 1 tab by mouth tid  RECLAST 5 MG/100ML  SOLN (ZOLEDRONIC ACID) 1x/year  BASE D POLYETHYLENE GLYCOL   POWD (POLYETHYLENE GLYCOL 4500) prn  SIMPONI 50 MG/0.5ML SOLN (GOLIMUMAB) inj 1x monthly    Smoking Status: smoker   Medications reviewed, updated and verified with patient or patient representative.      Physical Examination            Assessment and Plan     Assessment   Possible demyelinating disease  Cognitive inefficiency  Personal history of rheumatoid arthritis  Depression  Anxiety  Diagnosis of attention-deficit disorder  PTSD    Paula Good's brain MRI identifies a number of regions of T2 signal abnormality. The appearance of her brain MRI has remained stable. These could represent demyelinating lesions, small regions of stroke, or small regions of gliosis. She carries a diagnosis of rheumatoid arthritis, which can be associated with regions of signal abnormality on brain MRIs. Her is obviously a complex clinical picture.     She initially presented for evaluation of amnestic/dissociative spells. These do not sound suggestive of a neurodegenerative disorder or of haing a neurological origin. We again discussed the importance of addressing her psychlogical and psychiatric history to enable her to better enjoy her quality of  life.    We also discussed that her complex psychiatric history and multiple psychoactive medications further complicates the clinical picture.       Plan   1. Follow-through with psychiatry/psychology referral and ongoing management  2. Follow-up with PCP for her general medical care  3. Follow-up in 1 year with brain MRI  4. We may consider referral for neuropsychological assessment at her next follow-up visit if her cognitive inefficiency persists despite adequate control of her mental health issues.    Greater than half of this 40 minute office visit was spent counselling the patient and family regarding  the MRI, laboratory and EEG findings and  the assessment and plan as outlined above. I provided an opportunity to address her numerous insightful questions.

## 2008-08-10 NOTE — Unmapped (Signed)
Signed by Blanch Media CMA on 08/10/2008 at 09:41:40    Clinical Lists Changes    Orders:  Added new Referral order of Neuro- Psychological Testing [phone]) - Signed

## 2008-08-10 NOTE — Unmapped (Signed)
Signed by Jovita Gamma MD on 08/10/2008 at 00:00:00  Neurology      Imported By: Betsey Amen 08/17/2008 09:06:21    _____________________________________________________________________    External Attachment:    Please see [employer] EMR for this document.

## 2008-08-10 NOTE — Unmapped (Signed)
Signed by Jovita Gamma MD on 08/17/2008 at 08:18:21    [facility]           [facility]    Healing ??? Teaching ??? Leading     [address], [address]  [place], [place] [address]  Ph:   [phone]  Fax: [phone]        Aug 10, 2008        Heather Roberts, M.D.      RE: TAIMANE STIMMEL   DOB:  1966/01/03    Dear Dr. Tyler Aas:    I had the pleasure of seeing your patient, Yarlin Breisch, in consultation in the [facility] at [facility] on 08/10/2008.     History of Present Illness:  Mrs. Lengel returns in the company of her husband to review the results of her evaluation.      She continues to be very aggravated by her memory difficulties. She has re-arranged her life to minimize the impact of her PTSD on her life and has changed her social circle to avoid people who may trigger disconcerting memories. She is quite sensitive to alcohol, and finds this has a profound effect on her functioning.              Impression:  Possible demyelinating disease  Cognitive inefficiency  Personal history of rheumatoid arthritis  Depression  Anxiety  Diagnosis of attention-deficit disorder  PTSD    Mrs. Bram's brain MRI identifies a number of regions of T2 signal abnormality. The appearance of her brain MRI has remained stable. These could represent demyelinating lesions, small regions of stroke, or small regions of gliosis. She carries a diagnosis of rheumatoid arthritis, which can be associated with regions of signal abnormality on brain MRIs. Her is obviously a complex clinical picture.     She initially presented for evaluation of amnestic/dissociative spells. These do not sound suggestive of a neurodegenerative disorder or of having a neurological origin. We again discussed the importance of addressing her psychological and psychiatric history to enable her to better enjoy her quality of life.    We also discussed that her complex psychiatric history and multiple psychoactive medications  further complicates the clinical picture.         Plan:  1. Follow-through with psychiatry/psychology referral and ongoing management  2. Follow-up with PCP for her general medical care  3. Follow-up in 1 year with brain MRI  4. We may consider referral for neuropsychological assessment at her next follow-up visit if her cognitive inefficiency persists despite adequate control of her mental health issues.    Greater than half of this office visit was spent counselling the patient and family regarding  the MRI, laboratory and EEG findings and the assessment and plan as outlined above. I provided an opportunity to address her numerous insightful questions.      Thank you for allowing me to participate in the care of this pleasant patient.  If you would like a copy of my office note, please contact our Medical Records department at [phone].  Please feel free to contact me should you have any concerns or questions.    Sincerely,      Jovita Gamma, MD

## 2008-09-02 LAB — CBC WITH AUTO DIFFERENTIAL
Basophils %: 0.4 % (ref 0.0–2.0)
Basophils Absolute: 0 10*3 (ref 0.0–0.2)
Eosinophils %: 2.4 % (ref 0.0–5.0)
Eosinophils Absolute: 0.2 10*3 (ref 0.0–0.6)
Granulocyte Absolute Count: 4.5 10*3 (ref 1.7–7.7)
Hematocrit: 36.9 % (ref 36.0–48.0)
Hemoglobin: 12.7 g/dL (ref 12.0–16.0)
Lymphocytes %: 30.1 % (ref 25.0–40.0)
Lymphocytes Absolute: 2.2 10*3 (ref 1.0–5.1)
MCH: 31.5 pg (ref 26–34)
MCHC: 34.4 g/dL (ref 31–36)
MCV: 91.5 fl (ref 80–100)
MPV: 7 fl (ref 5.0–10.5)
Monocytes %: 7.1 % (ref 0.0–8.0)
Monocytes Absolute: 0.5 10*3 (ref 0.0–0.95)
Platelets: 329 10*3 (ref 135–450)
RBC: 4.03 10*6 (ref 4.0–5.2)
RDW: 14.2 % (ref 11.5–14.5)
Segs Relative: 60 % (ref 42.0–63.0)
WBC: 7.4 10*3 (ref 4.0–11.0)

## 2008-09-02 LAB — C-REACTIVE PROTEIN: wr-CRP: 16.7 mg/L — ABNORMAL HIGH (ref <5.1)

## 2008-09-02 LAB — SEDIMENTATION RATE, MANUAL: Sed Rate, Automated: 42 mm/h — ABNORMAL HIGH (ref 0–20)

## 2008-09-17 LAB — CBC WITH AUTO DIFFERENTIAL
Basophils %: 0.3 % (ref 0.0–2.0)
Basophils Absolute: 0 10*3 (ref 0.0–0.2)
Eosinophils %: 0.6 % (ref 0.0–5.0)
Eosinophils Absolute: 0 10*3 (ref 0.0–0.6)
Granulocyte Absolute Count: 4.9 10*3 (ref 1.7–7.7)
Hematocrit: 40.8 % (ref 36.0–48.0)
Hemoglobin: 13.8 g/dL (ref 12.0–16.0)
Lymphocytes %: 21.6 % — ABNORMAL LOW (ref 25.0–40.0)
Lymphocytes Absolute: 1.4 10*3 (ref 1.0–5.1)
MCH: 31.3 pg (ref 26–34)
MCHC: 33.9 g/dL (ref 31–36)
MCV: 92.4 fl (ref 80–100)
MPV: 7.3 fl (ref 5.0–10.5)
Monocytes %: 4.2 % (ref 0.0–8.0)
Monocytes Absolute: 0.3 10*3 (ref 0.0–0.95)
Platelets: 301 10*3 (ref 135–450)
RBC: 4.42 10*6 (ref 4.0–5.2)
RDW: 14 % (ref 11.5–14.5)
Segs Relative: 73.3 % — ABNORMAL HIGH (ref 42.0–63.0)
WBC: 6.6 10*3 (ref 4.0–11.0)

## 2008-09-17 LAB — SEDIMENTATION RATE, MANUAL: Sed Rate, Automated: 37 mm/h — ABNORMAL HIGH (ref 0–20)

## 2008-09-17 LAB — C-REACTIVE PROTEIN: wr-CRP: 7.1 mg/L — ABNORMAL HIGH (ref <5.1)

## 2008-10-06 LAB — CBC WITH AUTO DIFFERENTIAL
Basophils %: 0 % (ref 0.0–2.0)
Basophils Absolute: 0 10*3 (ref 0.0–0.2)
Eosinophils %: 0.2 % (ref 0.0–5.0)
Eosinophils Absolute: 0 10*3 (ref 0.0–0.6)
Granulocyte Absolute Count: 5.8 10*3 (ref 1.7–7.7)
Hematocrit: 41 % (ref 36.0–48.0)
Hemoglobin: 14.1 g/dL (ref 12.0–16.0)
Lymphocytes %: 18.7 % — ABNORMAL LOW (ref 25.0–40.0)
Lymphocytes Absolute: 1.4 10*3 (ref 1.0–5.1)
MCH: 30.7 pg (ref 26–34)
MCHC: 34.4 g/dL (ref 31–36)
MCV: 89.5 fl (ref 80–100)
MPV: 8.2 fl (ref 5.0–10.5)
Monocytes %: 3 % (ref 0.0–8.0)
Monocytes Absolute: 0.2 10*3 (ref 0.0–0.95)
Platelets: 325 10*3 (ref 135–450)
RBC: 4.58 10*6 (ref 4.0–5.2)
RDW: 13.2 % (ref 11.5–14.5)
Segs Relative: 78.1 % — ABNORMAL HIGH (ref 42.0–63.0)
WBC: 7.5 10*3 (ref 4.0–11.0)

## 2008-10-06 LAB — C-REACTIVE PROTEIN: wr-CRP: 15.3 mg/L — ABNORMAL HIGH (ref <5.1)

## 2008-10-06 LAB — SEDIMENTATION RATE, MANUAL: Sed Rate, Automated: 49 mm/h — ABNORMAL HIGH (ref 0–20)

## 2008-12-13 ENCOUNTER — Inpatient Hospital Stay
Admit: 2008-12-13 | Discharge: 2008-12-13 | Disposition: A | Discharge status: Home / Self Care | Attending: Emergency Medicine

## 2008-12-13 NOTE — ED Provider Notes (Signed)
TRIAGE CHIEF COMPLAINT:   Chief Complaint   Patient presents with   . Ankle Pain     Twisted left ankle while walking down stairs.          HPI: Rebecca Jenkins is a 43 y.o. female who presents to the emergency department with left ankle pain.  The patient [place] the [employer] game when she was walking down the steps. She slipped and fell and twisted her left ankle.  She complains of left ankle and foot pain.  She also has some low back pain.  She had lumbar fusion surgery several months ago.  She denies any head or neck injuries.  No upper extremity pain.  No numbness or tingling.    REVIEW OF SYSTEMS:   Pertinent review of systems otherwise negative.     PAST MEDICAL HISTORY:   Past Medical History   Diagnosis Date   . Arthritis      RHEUMATOID   . Anxiety    . HYPERTENSION    . ADHD    . GERD (Gastroesophageal Reflux Disease)    . Constipation    . Irritable Bowel Syndrome           CURRENT MEDICATIONS:   No current facility-administered medications on file.  Current outpatient prescriptions:tramadol (ULTRAM) 50 MG tablet, Take 1 tablet by mouth every 6 hours as needed for Pain for 10 days., Disp: 15 tablet, Rfl: 0;  methotrexate 2.5 MG tablet, Take  by mouth once a week. Indications: 6 TABS WEEKLY-- UNSURE OF MG, Disp: , Rfl: ;  quetiapine (SEROQUEL) 100 MG tablet, Take 300 mg by mouth daily. Indications: 1 1/2 TABS AT BEDTIME, Disp: , Rfl:   metoprolol (TOPROL-XL) 25 MG XL tablet, Take 25 mg by mouth 2 times daily., Disp: , Rfl: ;  metformin (GLUCOPHAGE) 500 MG tablet, Take 500 mg by mouth 2 times daily (with meals)., Disp: , Rfl: ;  lansoprazole (PREVACID SOLUTAB) 30 MG disintegrating tablet, Take 30 mg by mouth daily., Disp: , Rfl: ;  triamterene-hydrochlorothiazide (MAXZIDE-25) 37.5-25 MG per tablet, Take 1 tablet by mouth daily., Disp: , Rfl:   polyethylene glycol (GLYCOLAX) packet, Take 17 g by mouth daily as needed., Disp: , Rfl: ;  docusate sodium (DOC-Q-LACE) 100 MG capsule, Take 100 mg by mouth 2  times daily., Disp: , Rfl: ;  promethazine (PHENERGAN) 25 MG tablet, Take 25 mg by mouth every 6 hours as needed., Disp: , Rfl: ;  BuPROPion HBr (APLENZIN) 348 MG TB24, Take  by mouth., Disp: , Rfl:       SURGICAL HISTORY:       Procedure Date   . Cesarean section      X3   . Cholecystectomy    . Liver biopsy      NON ALCOHOLIC CIRRHOSIS   . Tubal ligation           FAMILY HISTORY:   History reviewed.  No pertinent family history.     SOCIAL HISTORY:   History   Social History   . Marital Status: Married     Spouse Name: N/A     Number of Children: N/A   . Years of Education: N/A   Occupational History   . Not on file.   Social History Main Topics   . Tobacco Use: Yes -- 0.5 packs/day   . Alcohol Use: 0.5 oz/week     1 drink(s) per week   . Drug Use: No   . Sexually Active:  Other Topics Concern   . Not on file   Social History Narrative   . No narrative on file          ALLERGIES: Other, Codeine, Hydrocodone and Oxycodone    PHYSICAL EXAM:  VITAL SIGNS: BP 111/69  Pulse 63  Temp 97.6 F (36.4 C)  Resp 18  Ht 5\' 4"  (1.626 m)  Wt 136 lb (61.689 kg)  SpO2 99%  Constitutional:  No acute distress, Non-toxic appearance  Neck: Normal range of motion, No tenderness, Supple  Cardiovascular:  Normal heart rate, Normal rhythm, No murmurs  Pulmonary/Chest:  Normal breath sounds, No respiratory distress,   Abdomen:  Soft, No tenderness,   Back:  No tenderness  Extremities:  Upper extremities are unremarkable.  Left lower extremity is tenderness on the lateral malleolus as well as midfoot and proximal fifth metatarsal.  No open wounds or significant swelling.  Normal pulses. No tenderness at the knee. Right lower extremity is unremarkable.  Neurologic:  Alert & oriented x 3 with no gross neurological deficits  Skin:  Warm, Dry, No erythema, No rash      Radiology / Procedures:  3 views of the left ankle show no acute fracture-dislocation.  Some degenerative changes likely from her rheumatoid arthritis.  Films  interpreted by myself.    Three views of the left foot show no acute fracture or dislocation.  Films interpreted by myself.    3 views of the lumbar spine show no acute fracture or subluxation.  Hardware appears in place and intact.  Films interpreted by myself.      ED COURSE:  Pertinent Labs & Imaging studies reviewed. (See chart for details)  Patient appeared to have mild ankle sprain.  He will be placed in an Aircast and given crutches.  Rest, ice and elevation.      FINAL IMPRESSION:  1 -- ankle sprain  2 --      PLAN:    Conservative management and follow up.

## 2008-12-13 NOTE — Discharge Instructions (Signed)
Ankle Sprain   WHAT YOU SHOULD KNOW:   A sprain is when ligaments are suddenly stretched or torn. The ligaments are tissues that hold bones together. A sprain is caused when you have a sudden injury to your ankle, such as tripping. Or you may have bent your ankle the wrong way. You may need to have an x-ray. Most of the time your ankle should be well in 4 to 6 weeks with treatment. You may have weakness/soreness in the injured ankle for 6 to 18 months after your injury.  INSTRUCTIONS:    Medicines:     Keep a written list of what medicines you take and when and why you take them. Bring the list of your medicines or the pill bottles when you see your caregivers. Learn why you take each medicine. Ask your caregiver for information about your medicines. Do not take any medicines without first talking to caregivers.     Always take your medicine as directed by caregivers. Call your caregiver if you think your medicines are not helping or if you feel you are having side effects. Do not quit taking it until you discuss it with your caregiver. If you are taking antibiotics (an-ti-bi-ah-tiks), take them until they are all gone even if you feel better.     You may take acetaminophen (uh-c-tuh-min-o-fin) or ibuprofen (i-bew-pro-fin) to help the pain. Do not take ibuprofen if you are allergic to aspirin.     If you are taking medicine that makes you drowsy, do not drive or use heavy equipment.      Put ice on the injury for 15 to 20 minutes each hour for the first 1 to 2 days. Put the ice in a plastic bag and place a towel between the bag of ice and your skin.     After the first 1 to 2 days, you may put heat on the injury to help lessen the pain. You may use a heating pad (set on low), a whirlpool bath, or warm, moist towels for 15 to 20 minutes every hour. Do this for 48 hours     For 48 hours keep your foot lifted above the level of your heart whenever possible to lessen the pain and swelling.     Activity:       You may walk on your ankle as the pain allows.     Stay off your feet for 24 hours. Then you can slowly walk more on the injured ankle as the pain allows.     Use crutches/cane until you can stand on your ankle without having pain.      If you have a plaster splint, wear the splint for as long as your caregiver says you should or until you see your caregiver for a follow-up visit.      Do not push or lean on it or it may break.     Do not get it wet. You may take it off to take a shower.      If you have an air splint, you may blow more air in it or take some out to make it more comfortable. You may take it off at night and to take a shower/bath.     You may have been given an elastic bandage (ace wrap) to use with the plaster splint or alone. The splint is too tight if your foot or ankle feels numb or tingly. You can rewrap it to make it comfortable.  If you got a tetanus shot, your arm may get swollen, red, and warm to touch at the shot site. This is a normal reaction to the medicine in the shot.   CONTACT A CAREGIVER IF:    Your bruising, swelling, or pain is getting worse.     Your toes below the injury feel cold when you touch them.     Your toes below the injury are numb or blue.   Copyright  2009. [employer]. All rights reserved. Information is for End User's use only and may not be sold, redistributed or otherwise used for commercial purposes.  The above information is an educational aid only. It is not intended as medical advice for individual conditions or treatments. Talk to your doctor, nurse or pharmacist before following any medical regimen to see if it is safe and effective for you.

## 2009-06-03 NOTE — Progress Notes (Signed)
PFT CANCEL PER PATIENT REQUEST

## 2009-06-11 NOTE — Progress Notes (Signed)
Pt complained of being dizzy & lightheaded @ 7:18 mins into test. Treadmill stopped. Bp 139/69 p125.symptoms subsided when pt sat down.Barnes Florek RN

## 2009-06-11 NOTE — Progress Notes (Signed)
Patient instructed on [place] Protocol Stress Test Procedure including possible side effects. Verbalizes knowledge and understanding and denies having any questions.  Verneta Hamidi C Rayne Cowdrey, RN

## 2009-06-22 LAB — COMPREHENSIVE METABOLIC PANEL
ALT: 12 U/L (ref 10–40)
AST: 17 U/L (ref 15–37)
Albumin/Globulin Ratio: 1.3 (ref 1.1–2.2)
Albumin: 4.3 g/dL (ref 3.4–5.0)
Alkaline Phosphatase: 48 U/L (ref 45–129)
BUN: 8 mg/dL (ref 7–18)
CO2: 31 meq/L (ref 21–32)
Calcium: 9.4 mg/dL (ref 8.3–10.6)
Chloride: 102 meq/L (ref 99–110)
Creatinine: 0.8 mg/dL (ref 0.6–1.1)
GFR Est, African/Amer: >60
GFR, Estimated: >60 (ref >60)
Glucose: 109 mg/dL — ABNORMAL HIGH (ref 70–99)
Potassium: 3.9 meq/L (ref 3.5–5.1)
Sodium: 135 meq/L — ABNORMAL LOW (ref 136–145)
Total Bilirubin: 0.3 mg/dL (ref 0.0–1.0)
Total Protein: 7.5 g/dL (ref 6.4–8.2)

## 2009-06-22 LAB — CBC WITH AUTO DIFFERENTIAL
Basophils %: 0.3 % (ref 0.0–2.0)
Basophils Absolute: 0 10*3 (ref 0.0–0.2)
Eosinophils %: 1.7 % (ref 0.0–5.0)
Eosinophils Absolute: 0.1 10*3 (ref 0.0–0.6)
Granulocyte Absolute Count: 3.5 10*3 (ref 1.7–7.7)
Hematocrit: 40.9 % (ref 36.0–48.0)
Hemoglobin: 13.8 g/dL (ref 12.0–16.0)
Lymphocytes %: 39.6 % (ref 25.0–40.0)
Lymphocytes Absolute: 2.7 10*3 (ref 1.0–5.1)
MCH: 31.3 pg (ref 26–34)
MCHC: 33.7 g/dL (ref 31–36)
MCV: 92.8 fl (ref 80–100)
MPV: 8.5 fl (ref 5.0–10.5)
Monocytes %: 6.8 % (ref 0.0–12.0)
Monocytes Absolute: 0.5 10*3 (ref 0.0–1.3)
Platelets: 275 10*3 (ref 135–450)
RBC: 4.41 10*6 (ref 4.0–5.2)
RDW: 13 % (ref 11.5–14.5)
Segs Relative: 51.6 % (ref 42.0–63.0)
WBC: 6.7 10*3 (ref 4.0–11.0)

## 2009-06-22 LAB — TSH: TSH: 1.46 u[IU]/mL (ref 0.35–5.5)

## 2009-07-08 NOTE — Procedures (Signed)
PATIENT NAME:                   PA #:             MR #Rebecca Jenkins, Rebecca Jenkins               [id number]        [id number]         ATTENDING PHYSICIAN:                     DATE:        DIS DATE:       TERESA M SLOUGH, MD                      06/11/2009   06/11/2009      DATE OF BIRTH:     AGE:            PATIENT TYPE:      RM #:           November 01, 1965         44              OPF                                   INDICATIONS:  Dyspnea.     INTERPRETATION:  Spirometry showed normal FVC of 93% predicted and normal  FEV1 of 89% predicted.  FEV1/FVC ratio was normal.  Lung volumes showed  increased total lung capacity of 124% predicted and residual volume of 184%  predicted.  Diffusion capacity showed decreased DLCO of 71% predicted.      IMPRESSION:  No obstructive defect appreciated on spirometry, although, lung  volumes showed air trapping and hyperinflation, which might be indicative of  possible underlying small airway disease like asthma.  Clinical correlation  is recommended.  Mild decrease in diffusion capacity noted.                                              Joycelyn Das, MD     [id number]  DD: 07/08/2009 08:47   DT: 07/08/2009 10:27   Job #: [id number]  CC: Gweneth Fritter, DO

## 2009-08-24 LAB — CBC WITH AUTO DIFFERENTIAL
Basophils %: 0.3 % (ref 0.0–2.0)
Basophils Absolute: 0 10*3 (ref 0.0–0.2)
Eosinophils %: 1.7 % (ref 0.0–5.0)
Eosinophils Absolute: 0.2 10*3 (ref 0.0–0.6)
Granulocyte Absolute Count: 4.5 10*3 (ref 1.7–7.7)
Hematocrit: 37.4 % (ref 36.0–48.0)
Hemoglobin: 12.7 gm/dl (ref 12.0–16.0)
Lymphocytes %: 39.7 % (ref 25.0–40.0)
Lymphocytes Absolute: 3.5 10*3 (ref 1.0–5.1)
MCH: 31.3 pg (ref 26–34)
MCHC: 34 gm/dl (ref 31–36)
MCV: 92.1 fl (ref 80–100)
MPV: 8 fl (ref 5.0–10.5)
Monocytes %: 7.9 % (ref 0.0–12.0)
Monocytes Absolute: 0.7 10*3 (ref 0.0–1.3)
Platelets: 281 10*3 (ref 135–450)
RBC: 4.07 10*6 (ref 4.0–5.2)
RDW: 13.6 % (ref 11.5–14.5)
Segs Relative: 50.4 % (ref 42.0–63.0)
WBC: 8.9 10*3 (ref 4.0–11.0)

## 2009-08-24 LAB — FOLLICLE STIMULATING HORMONE: FSH: 70.1 m[IU]/mL
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
## 2010-05-07 ENCOUNTER — Observation Stay
Admit: 2010-05-07 | Discharge: 2010-05-09 | Disposition: A | Discharge status: Home / Self Care | Source: Home / Self Care

## 2010-05-07 NOTE — Unmapped (Signed)
[facility]     PATIENT NAME:   TORII, ROYSE                 MR #:  [id number]   DATE OF BIRTH:  July 12, 1965                        ACCOUNT #:  [id number]   ED PHYSICIAN:   Kristine Garbe, M.D.              ROOM #:  309   PRIMARY:        Referring Nonstaff                NURSING UNIT:  M3PT   REFERRING:      Clarisse Gouge, M.D.              FC:  C   DICTATED BY:    Evelene Croon, P.A.              ADMIT DATE:  05/06/2010   VISIT DATE:                                       DISCHARGE DATE:                               EMERGENCY DEPARTMENT NOTE     *-*-*     CHIEF COMPLAINT:  Dizziness and room spinning.     HISTORY OF PRESENT ILLNESS:  The patient is a 45 year old female presenting   with a bunch of complaints.  She states that the room is spinning.  She   nearly passed out earlier today.  She has had difficulty balancing when   walking.  She has had blurred vision and some degree of double vision.  Some   of these symptoms have been present over the last two days.  Some have been   worsened over just today.  She noticed these symptoms acutely worsened when   she was having her eyebrows waxed.  She looked at her friend and felt dizzy   immediately.  She has no chest pain, shortness of breath.  No fevers or   chills.     PAST MEDICAL HISTORY:     1.  Gastroesophageal reflux disease.   2.  Reported rheumatoid arthritis.   3.  Osteoporosis.   4.  Fibromyalgia.   5.  Scoliosis.   6.  Cholecystectomy.     SOCIAL HISTORY:  She smokes cigarettes, uses alcohol occasionally.  No   illicit drugs.     MEDICATIONS:  See ED notes.     ALLERGIES:  None.     REVIEW OF SYSTEMS:  As per HPI, otherwise negative.     PHYSICAL EXAMINATION:     VITAL SIGNS:  Blood pressure 140/88, pulse 83, respirations 18, temperature   98.4, O2 saturation 99% on room.   GENERAL:  Awake, alert and oriented in no acute distress.   HEENT:  Normocephalic, atraumatic.  Pupils are equal, round and reactive to   light  and accommodation.  Extraocular movements are intact.  There is no   evidence of strabismus.   CHEST:  Clear breath sounds bilaterally with no wheezes, rales or rhonchi.   HEART:  Regular rate and rhythm with no murmurs, rubs or gallops.   ABDOMEN:  Soft, nontender, nondistended, good bowel sounds with no   organomegaly.   EXTREMITIES:  No clubbing, cyanosis or edema.   NEUROLOGIC:  Cranial nerves are intact.  Her reflexes are normal.  She has   appropriate strength in bilateral upper and lower extremities.  She has a   normal pronator drift.  She has an ataxic gait when walking.     EMERGENCY DEPARTMENT COURSE:  She was given a liter of fluid, Antivert and   Zofran.     Her laboratory studies show a white count of 8.2 with an H&H of 13.9 and   41.3.  Sodium is 143, potassium is 4.1, BUN and creatinine are 7 and 0.8.   Urinalysis looks negative.     Head CT does not show any acute abnormalities.  She does have mild white   matter disease noted that was present on a previous MRI as well.     Her symptoms did not improve significantly while here, though she was here   for a couple of hours.  She still had a ataxic gait.  She still had vague   symptoms of diplopia, but her diplopia was present with both eyes open and as   she covered either eye, which did not make much sense, other than an   intrinsic eye problem which would surprising.  She will be admitted to the   hospital to rule out a CVA.     DIAGNOSIS:     1.  Ataxia.     PLAN:     1.  Hospital admission.       *-*-*                                             _______________________________________   RA/pjt                                 _____   D:  05/06/2010 23:57                  Evelene Croon, P.A.   T:  05/07/2010 01:00   Job #:  [id number]                                         _______________________________________                                          _____                                         Kristine Garbe, M.D.                                   EMERGENCY DEPARTMENT NOTE  PAGE    1 of   1

## 2010-05-07 NOTE — Unmapped (Signed)
[facility]     PATIENT NAME:   Paula Good, Paula Good                 MR #:  [id number]   DATE OF BIRTH:  1965/05/03                        ACCOUNT #:  [id number]   ADMITTING:      Clarisse Gouge, M.D.              ROOM #:  309   ATTENDING:      Clarisse Gouge, M.D.              NURSING UNIT:  M3PT   PRIMARY:        Referring Nonstaff                FC:  C   REFERRING:      Clarisse Gouge, M.D.              ADMIT DATE:  05/06/2010   DICTATED BY:    Clarisse Gouge, M.D.              DISCHARGE DATE:                                  HISTORY & PHYSICAL     *-*-*     CHIEF COMPLAINT:  Dizziness.     HISTORY OF PRESENT ILLNESS:  This is a 45 year old Caucasian female who   presented to the emergency room because of the room spinning.  She reported   to the ER that she nearly passed out, but to me she reported that she passed   out earlier.  Reportedly she has difficulty balancing.  She went to get her   eyebrows done.  After that she had an episode where she reportedly passed out   and after that she started having symptoms of the room spinning.  It waxed   and waned.  She denied any chest pain, shortness of breath.  Denied any   wheezing or  cough.  She reports that she is a little better this morning,   where she can actually look to the side, but she is still feels dizzy and   concerned about getting up and upon walking.     PAST MEDICAL HISTORY:     1.  Rheumatoid arthritis.   2.  Fibromyalgia.   3.  Osteoporosis.   4.  Gastroesophageal reflux disease.     MEDICATIONS:  List of medications reviewed.     SOCIAL HISTORY:  The patient lives at home.  Smokes one pack per day.     FAMILY HISTORY:  Denies any premature coronary artery disease.     REVIEW OF SYSTEMS:  Comprehensive review of systems done.  Pertinent   positives noted.  All other reviewed systems otherwise negative.     PHYSICAL EXAMINATION:     VITAL SIGNS:  Blood pressure is currently 120/78, pulse 63, respirations 16,   temperature 97.7, saturating 100% on room air.   CONSTITUTIONAL:  Caucasian female, currently does not appear to be in acute   distress.   HEENT:  Pupils equal, reacting to light.  No pallor.  ENT:  No nasal   congestion, no sinus tenderness.  Tongue moist.   NECK:  No jugular venous distention, no thyromegaly.   CHEST:  Unlabored breathing.  Bilaterally clear to auscultation.   HEART:  S1, S2, Regular rate and rhythm.   ABDOMEN:  Soft, nontender, nondistended.  Bowel sounds positive.   EXTREMITIES:  No edema.  Pulses positive.   SKIN:  Normal turgor, no rash.   PSYCHIATRIC:  Patient alert, awake, oriented x3, appears calm.   CNS:  Extraocular movements symmetrical and equal.  No nystagmus at this   time.  Sensations over the face grossly normal.  Power equal and symmetric in   both upper and lower extremities.  Sensation grossly normal.  Babinski   downgoing bilaterally.     LABORATORY DATA:  She had a CBC, BMP, which were within normal limits.  UA   was essentially negative.  LDL was 64.     MRI/MRA is pending.     HEAD CT:  CT head did not show any acute intracranial hemorrhage or mass   effect.  It did show a small focus of low density inferiorly within the right   frontal white matter.     EKG:  Regular rate and rhythm by my interpretation shows sinus rhythm, no   acute ST-T wave changes.     ASSESSMENT AND PLAN:     1.  Dizziness, etiology unclear.  Will follow up with the MRI.  Will obtain     neurology consultation.  We will keep her on aspirin for now.   2.  Rheumatoid arthritis.   3.  Gastroesophageal reflux disease.   4.  Fibromyalgia.   5.  Osteoporosis.   6.  Tobacco dependence.  Restart home medications as tolerated.   7.  Fragmin for DVT prophylaxis.     Thank you for allowing [facility] to care for this patient at   [facility].       *-*-*                                             _______________________________________   RK/cmb                                 _____   D:   05/07/2010 11:35                   Clarisse Gouge, M.D.   T:  05/07/2010 15:16   Job #:  [id number]                                       HISTORY & PHYSICAL                                                                PAGE    1 of   1   RK/cmb                                 _____   D:  05/07/2010 11:35  Clarisse Gouge, M.D.   T:  05/07/2010 15:16   Job #:  [id number]                                       HISTORY & PHYSICAL                                                                PAGE    1 of   1

## 2010-05-10 NOTE — Unmapped (Signed)
[facility]     PATIENT NAME:   Paula Good, Paula Good                 MR #:  [id number]   DATE OF BIRTH:  December 21, 1965                        ACCOUNT #:  [id number]   ADMITTING:      Clarisse Gouge, M.D.              ROOM #:  309   ATTENDING:      Clarisse Gouge, M.D.              NURSING UNIT:  M3PT   SERVICE:        Internal Medicine                 FC:  C   PRIMARY:        Referring Nonstaff                ADMIT DATE:  05/06/2010   REFERRING:      Clarisse Gouge, M.D.              DISCHARGE DATE:  05/09/2010   DICTATED BY:    Clementeen Hoof, M.D.                                   DISCHARGE SUMMARY     *-*-*     DISCHARGE DIAGNOSES:     1.  Vertigo, peripheral.   2.  Rheumatoid arthritis.   3.  Fibromyalgia.   4.  Tobacco abuse disorder.     CONDITION ON DISCHARGE:  Stable.     DISCHARGE MEDICATIONS:     1.  Aspirin 81 mg daily.   2.  Continue rest of home medicines with no changes.     CONSULTATION:     1.  Neurology.     PROCEDURES PERFORMED:     1.  MRI/MRA of the brain:  No acute changes.   2.  A 2-D echo of the heart, normal.     BRIEF HISTORY AND HOSPITAL COURSE:  This is a 45 year old, white female   admitted because of vertigo and dizziness.  Her workup was negative.  She   improved and neurology consulted.  Their impression is that it is peripheral   vertigo and she was discharged home in stable condition.     She is on a high dose of Seroquel.  I had a long discussion with the patient   and her husband regarding side effects of the medicine, and also the   importance to stop smoking and to add baby aspirin.  If symptoms __ or any   symptoms of a stroke, to come back to the hospital immediately.  The   remaining of her medical problems remained stable.     I spent 35 minutes discharging the patient, writing prescriptions, discussing   the case with the patient and her husband and dictating.       *-*-*       _______________________________________   FB/nc                                  _____   D:  05/09/2010 12:07  Clementeen Hoof, M.D.   T:  05/10/2010 19:07   Job #:  [id number]                                        DISCHARGE SUMMARY                                                                PAGE    1 of   1   T:  05/10/2010 19:07   Job #:  [id number]                                        DISCHARGE SUMMARY                                                                PAGE    1 of   1

## 2010-05-12 NOTE — Unmapped (Signed)
[facility]     PATIENT NAME:   Paula Good, Paula Good                 MR #:  [id number]   DATE OF BIRTH:  1966/02/14                        ACCOUNT #:  [id number]   ADMITTING:      Clarisse Gouge, M.D.              ROOM #:  309   ATTENDING:      Clarisse Gouge, M.D.              NURSING UNIT:  M3PT   CONSULTANT:     Cyndia Bent, M.D.           [facility]   SERVICE:        Neurology                         ADMIT DATE:  05/06/2010   PRIMARY:        Referring Nonstaff                CONSULT DATE:  05/08/2010   REFERRING:      Clarisse Gouge, M.D.              DISCHARGE DATE:   DICTATED BY:    Cyndia Bent, M.D.                                      CONSULTATION     *-*-*      REASON FOR CONSULTATION:  Acute vertigo.      HISTORY OF PRESENT ILLNESS:  This is a 45 year old female with a significant    history of hypertension, fibromyalgia, and chronic pain who came into the    hospital two days ago with acute vertigo.      By her report, she had a sudden onset of true spinning sensation when she    stood up or moved her head quickly.  She then felt nauseated and   lightheaded.    Her symptoms persisted the next day and then she decided to come to the    hospital where she was admitted two days ago.      She may have passed out for a few seconds before such incident by her   report.    Denies any witnessed seizure, tongue biting or postictal state.      When she came into the hospital, she was admitted for observation.      During her hospital stay, his symptoms resolved.  Now she only feels mildly    nauseated but no vertigo or spinning sensation.  She denies any new symptoms    today.  No ringing in her ears.  No chest pain, shortness of breath,    abdominal pain, diarrhea, constipation.  No history of migraine headaches.      No recent change in medication by her report.  No recent fever.  Her MRI of    the brain and MRA, which I reviewed today, showed evidence of chronic small     vessel disease without any intra or extracranial stenosis.      Today, the patient denies any new symptoms.  Other 14-point review of   systems  only remarkable for occasional memory lapse and chronic pain.      PAST MEDICAL HISTORY:      1.  Rheumatoid arthritis.    2.  Fibromyalgia.    3.  Osteoporosis.    4.  Reflux disease.      SOCIAL HISTORY:  Smokes one pack a day.  Denies use of drugs or alcohol.    Married.      MEDICATIONS:      1.  Aspirin.    2.  Metformin.    3.  Lopressor.    4.  Prilosec.    5.  Seroquel.    6.  Other as needed medications, which were reviewed in her chart.      ALLERGIES:      1.  Codeine.      FAMILY HISTORY:  Noncontributory.      REVIEW OF SYSTEMS:  Fourteen points reviewed with the patient and her    husband, otherwise from history of present illness.      PHYSICAL EXAMINATION:      VITAL SIGNS:  Temperature is afebrile, blood pressure 130/79, heart rate was    69, respirations 18.    NECK:  Supple.  Good range of motion.  No stiffness.  No bruit.    CHEST:  Clear.    HEART:  Regular, S1, S2.    EXTREMITIES:  No edema.    ABDOMEN:  Benign.    EXTREMITIES:  No rash.    NEUROLOGIC:  Mental status:  She is alert, awake, oriented x3, fluent.      Pupils are regular and reactive.  Extraocular muscles were intact.  No    nystagmus.  Face was symmetric.  Tongue was midline.  Visual fields were    full.  The rest of cranial nerves were normal.  Motor 5/5, no weakness.    Normal tone, no tremors.  Reflexes were 2+ throughout.  Plantars downgoing.    Sensory was unremarkable.  Cerebellar exam:  No tremors, no dysmetria.    Positional examination testing was negative.  Gait was not tested.      LABORATORY DATA/X-RAY:  MRI/MRA per my history of present illness.  They   were    unremarkable.      Her triglycerides were 330, LDL 64.  Calcium 8.3.      IMPRESSION:      1.  New onset vertigo, now resolved.  Most likely peripheral vertigo.  Will    consider benign paroxysmal positional vertigo.   The patient's symptoms now    resolved.  Less likely central.    2.  Fibromyalgia.    3.  Rheumatoid arthritis.      RECOMMENDATIONS:      1.  Discuss test results with the patient in detail.    2.  No need for intravenous fluids at this time.    3.  Okay with echocardiogram and followup her cardiac monitor.    4.  Can be discharged from neurology tomorrow when medically stable.    5.  No further recommendations.      Thank you for your consult.     *-*-*                   *** THIS IS AN ELECTRONICALLY VERIFIED REPORT ***   Cyndia Bent, M.D. at 05/12/2010 10:54 PM  _______________________________________   TA/md                                  _____   D:  05/08/2010 12:13                   Cyndia Bent, M.D.   T:  05/08/2010 13:48   Job #:  [id number]                                           CONSULTATION                                                                PAGE    1 of   1   T:  05/08/2010 13:48   Job #:  [id number]                                           CONSULTATION                                                                PAGE    1 of   1

## 2010-05-25 NOTE — Unmapped (Signed)
[facility]     PATIENT NAME:   Paula Good, Paula Good                 MR #:  [id number]   DATE OF BIRTH:  1965-04-10                        ACCOUNT #:  [id number]   ED PHYSICIAN:   Kristine Garbe, M.D.              ROOM #:  309   PRIMARY:        Referring Nonstaff                NURSING UNIT:  M3PT   REFERRING:      Clarisse Gouge, M.D.              FC:  C   DICTATED BY:    Kristine Garbe, M.D.              ADMIT DATE:  05/06/2010   VISIT DATE:     05/06/2010                        DISCHARGE DATE:                               EMERGENCY DEPARTMENT NOTE     *-*-*      CHIEF COMPLAINT:  Dizzy.      The patient was seen primarily by the PA.  I saw, examined and interviewed    the patient personally and discussed the care with the PA, and I agree    entirely with assessment and plan with the following additions:      The patient describes true vertigo.  She certainly has an unusual affect   that    I cannot explain though this does seem to be chronic.  She has a normal    neurologic exam.  She initially described to me diplopia; however, she   states    that when she closes one eye the diplopia remained.  I do not see any    abnormality with that eye and it is only in the right eye.  Her pupils are    equal, round and reactive to light.  Extraocular muscles are intact.    Funduscopic exam is normal in that eye.  The patient is somewhat slow to    respond to questions at times.  However, again neuro exam is completely    normal.  She has no true ataxia,though she does have difficulty with   ambulation secondary to vertigo and nausea.  She has a normal Romberg test.   She has    normal finger-to-nose.  She has no pronator drift.  She has cranial nerves   II    through XII are intact.  She has no nystagmus.  I did attempt a head impulse    test.  However, I was unable to get a clear interpretation of that test.    TMs    are clear bilaterally.  The dizziness that she has is worsened with  head    movement and getting up and moving around.  It has been going on for a   couple    of days.  At this point, I have low suspicion given her age, risk factors   and    exam,  for this being a cerebellar stroke or a posterior vascular issue.  She    did receive a CT scan of the brain today to rule out chronic subdural    epidural hematoma, given that she did have a    recent fall and strike of the head and this was normal.  Howewver, given the   the somewhat unclear history of the symptoms, the possibility of syncope   temporally associated with the onset vertigo and the chronic changes   on the CT indicative of baseline vascular disease, and persistent symptoms   her in the ED, I think it is in her best intest to be admitted for   observation and MRI.   *-*-*                   *** THIS IS AN ELECTRONICALLY VERIFIED REPORT ***   Kristine Garbe, M.D. at 05/25/2010 10:10 PM                                           _______________________________________   DC/kd                                  _____   D:  05/06/2010 19:10                  Kristine Garbe, M.D.   T:  05/07/2010 06:39   Job #:  [id number]                                    EMERGENCY DEPARTMENT NOTE                                                                PAGE    1 of   1   D:  05/06/2010 19:10                  Kristine Garbe, M.D.   T:  05/07/2010 06:39   Job #:  [id number]                                    EMERGENCY DEPARTMENT NOTE                                                                PAGE    1 of   1

## 2010-06-10 ENCOUNTER — Inpatient Hospital Stay

## 2010-08-03 ENCOUNTER — Inpatient Hospital Stay

## 2010-09-20 MED FILL — SODIUM CHLORIDE 0.9 % IV SOLN: 0.9 % | INTRAVENOUS | Qty: 1000

## 2010-09-22 MED FILL — PROPOFOL 10 MG/ML IV EMUL: 10 MG/ML | INTRAVENOUS | Qty: 20

## 2010-09-22 MED FILL — XYLOCAINE 2 % IJ SOLN: 2 % | INTRAMUSCULAR | Qty: 10

## 2010-12-11 NOTE — Unmapped (Signed)
[facility]     PATIENT NAME:   Paula Good, Paula Good                 MR #:  [id number]  DATE OF BIRTH:  1965-12-18                        ACCOUNT #:  [id number]  ED PHYSICIAN:   Lawernce Ion, M.D.          ROOM #:  PRIMARY:        Fanny Dance, M.D.          NURSING UNIT:  ED  REFERRING:                                        FC:  C  DICTATED BY:    Dwyane Luo Florina Ou, M.D.           ADMIT DATE:  12/11/2010  VISIT DATE:     12/11/2010                        DISCHARGE DATE:                               EMERGENCY DEPARTMENT NOTE     *-*-*     CHIEF COMPLAINT:  I fell.     HISTORY OF PRESENT ILLNESS:  The patient is a 45 year old female with a past  medical history of irritable bowel disease, fibromyalgia as well as  rheumatoid arthritis who states she was at a restaurant last night when she  tripped and fell.  She states she landed on her left knee and caught herself  with her right hand.  She has had difficulty walking since the fall last  night and has noticed bruising on her hand as well as her knee and has come  to get evaluated.     The patient states she has pain in the left knee and right hand, greatest in  the knee, however.  It is 8/10.  It is worse with trying to bear weight on  the knee.  She does note some swelling in the area as well.  She did not hit  her head, did not lose consciousness, has no abdominal pain and no chest  pain.  She has no numbness or tingling of note.     PAST MEDICAL HISTORY:  As above.     MEDICATIONS:     1.  Prilosec.  2.  Adderall.  3.  Metoprolol.  4.  Arava.  5.  Amitiza.     ALLERGIES:  None.     SOCIAL HISTORY:  The patient smokes one pack of cigarettes per day.  Drinks  alcohol socially.  Denies any illicit substance use.     REVIEW OF SYSTEMS:  See HPI, otherwise reviewed and negative.     PHYSICAL EXAMINATION:     VITAL SIGNS:  Blood pressure 138/99, pulse is 83, respirations 18,  temperature is 98.0, O2 sats 97% on  room air.  GENERAL:  The patient is a well-developed, 45 year old female resting  comfortably in bed.  HEENT:  Normocephalic, atraumatic.  Moist mucous membranes.  CHEST:  Clear to auscultation bilaterally.  CARDIOVASCULAR:  Regular rhythm and rate.  No murmur.  ABDOMEN:  Soft, nontender,  nondistended.  NEUROLOGIC:  Moving all extremities equally, responding to questions  appropriately, has intact sensation throughout.  MUSCULOSKELETAL:  Full range of motion of all joints.  The patient noticeably  does have some contusion to the thenar eminence of the right hand, but does  have full range of motion without any specific bony tenderness in the right  hand.  The patient also has some soft tissue swelling located on the anterior  aspect of the left knee.  She does have full flexion and full extension of  the left knee.  Extensor mechanism is intact.  She has some tenderness to  palpation over the patella and over the tibial tuberosity.  There is no  crepitus of note, there is no joint instability.  There is no warmth or  redness.     X-rays of the left knee and right hand showed no acute abnormalities.  There  is a noted small suprapatellar effusion located in the left knee.     EMERGENCY DEPARTMENT COURSE:  The patient was seen in the emergency  department, triaged to bed 19.  The aforementioned history and physical was  performed.  She was given two Percocet, also an Ace bandage was applied to  her left knee and ice was placed in the left knee.  She was eventually given  crutches prior to discharge home after an uneventful stay.     The patient comes in the emergency department today after a fall complaining  of left knee and right hand pain.     X-rays of the aforementioned site showed no acute abnormalities other than a  small effusion in the left knee.  She does have an intact extensor mechanism.  I have no concern for infection in the left knee and I see no obvious bony  abnormalities.     She is able to bear a  small amount of weight on the left knee and I will send  her home with crutches as it is possible she could have some ligamentous  injury.  I have notified the patient of this and encouraged her to follow up  with her orthopedist should she have continued symptoms greater than a week.  Otherwise, I do feel the patient can be discharged home.     CLINICAL IMPRESSION:     1.  Left knee contusion.  2.  Right hand contusion.     DISPOSITION:  Home in stable condition.     PLAN:     1.  Rest, ice, compress and elevate the affected areas.  2.  Take Percocet, numbering 30 tabs total, as needed for pain.  3.  Follow up with your orthopedist.  4.  Use crutches as shown.        *-*-*                                              _______________________________________  BBC/njn                                _____  D:  12/11/2010 12:54                  Rochelle Larue B. Florina Ou, M.D.  T:  12/11/2010 23:03  Job #:  [id number]  _______________________________________                                         _____                                        Lawernce Ion, M.D.                                   EMERGENCY DEPARTMENT NOTE                                                               PAGE    1 of   1

## 2011-01-16 ENCOUNTER — Encounter

## 2011-01-16 NOTE — Progress Notes (Signed)
I have reviewed the history and physical note and findings.

## 2011-01-16 NOTE — Patient Instructions (Signed)
Rebecca Jenkins was seen today for pre-op exam.    Diagnoses and associated orders for this visit:    Preop examination    Finger mass, left    Carpal tunnel syndrome of left wrist         Cleared for procedure.

## 2011-01-16 NOTE — Progress Notes (Signed)
Patient is here for a preop physical for left wrist extensor tenosynevectomy and left index mass at [facility] on 01/18/2011 by Dr. Arliss Journey with [facility]. She has had the cysts and carpal tunnel syndrome for over 8 months. She has had injections in her wrists in the past, did not work. Dr. Arliss Journey believes it is time for surgical intervention.    Preoperative Consultation    Rebecca Jenkins  Date of Birth:  [birth year]  Date of Service:  01/16/2011    Chief Complaint   Patient presents with   . Pre-op Exam       This patient presents to the office today for a preoperative consultation at the request of surgeon, Dr. Arliss Journey, who plans on performing left hand mass removal, CTS on October 24 at [facility].      Planned anesthesia: General   Known anesthesia problems: Past general anethesia with complications- has needed heavier anesthesia in the past.   Bleeding risk: No recent or remote history of abnormal bleeding  Personal or FH of DVT/PE: No      Patient Active Problem List   Diagnoses   . Other chronic nonalcoholic liver disease   . Tobacco use disorder   . Rheumatoid arthritis   . Unspecified essential hypertension   . Esophageal reflux       Past Medical History   Diagnosis Date   . Arthritis      RHEUMATOID   . Anxiety    . Hypertension    . ADHD    . GERD (gastroesophageal reflux disease)    . Constipation    . Irritable bowel syndrome      Past Surgical History   Procedure Date   . Cesarean section      X3   . Cholecystectomy 05/03   . Liver biopsy      NON ALCOHOLIC CIRRHOSIS   . Tubal ligation    . Breast enhancement surgery 2001       Allergies   Allergen Reactions   . Codeine    . Other      ORAL PAIN MED ? NAME   . Hydrocodone Nausea Only   . Oxycodone Nausea Only     Outpatient Prescriptions Marked as Taking for the 01/16/11 encounter (Office Visit) with Rebecca Amor, PA   Medication Sig Dispense Refill   . Calcium Carbonate-Vitamin D (CALTRATE 600+D) 600-400  MG-UNIT TABS Take  by mouth.         Marland Kitchen amphetamine-dextroamphetamine (ADDERALL) 30 MG tablet Take 30 mg by mouth daily.         . clonazePAM (KLONOPIN) 2 MG tablet Take 2 mg by mouth 2 times daily as needed.         . Abatacept (ORENCIA) 125 MG/1ML SOLN Inject  into the skin.         Marland Kitchen lubiprostone (AMITIZA) 24 MCG capsule Take 24 mcg by mouth 2 times daily (with meals).       . quetiapine (SEROQUEL) 100 MG tablet Take 300 mg by mouth daily. Indications: 1 1/2 TABS AT BEDTIME       . metoprolol (TOPROL-XL) 25 MG XL tablet Take 25 mg by mouth 2 times daily.       . metformin (GLUCOPHAGE) 500 MG tablet Take 850 mg by mouth 2 times daily (with meals).       . lansoprazole (PREVACID SOLUTAB) 30 MG disintegrating tablet Take 30 mg by mouth daily.  History   Substance Use Topics   . Smoking status: Current Everyday Smoker -- 0.5 packs/day     Types: Cigarettes   . Smokeless tobacco: Never Used   . Alcohol Use: 0.5 oz/week     1 drink(s) per week       Review of Systems  A comprehensive review of systems was negative except for what was noted in the HPI.     Physical Exam   BP 110/80  Pulse 81  Temp(Src) 98.2 F (36.8 C) (Tympanic)  Ht 5\' 4"  (1.626 m)  Wt 131 lb (59.421 kg)  BMI 22.49 kg/m2  Weight: 131 lb (59.421 kg)   Constitutional: She is oriented to person, place, and time. She appears well-developed and well-nourished. No distress.   HENT:   Head: Normocephalic and atraumatic.   Mouth/Throat: Uvula is midline, oropharynx is clear and moist and mucous membranes are normal.   Eyes: Conjunctivae and EOM are normal. Pupils are equal, round, and reactive to light.   Neck: Trachea normal and normal range of motion. Neck supple. No JVD present. Carotid bruit is not present. No mass and no thyromegaly present.   Cardiovascular: Normal rate, regular rhythm, normal heart sounds and intact distal pulses.  Exam reveals no gallop and no friction rub.  No murmur heard.  Pulmonary/Chest: Effort normal and breath  sounds normal. No respiratory distress. She has no wheezes. She has no rales.   Abdominal: Soft. Normal aorta and bowel sounds are normal. She exhibits no distension and no mass. There is no hepatosplenomegaly. No tenderness.   Musculoskeletal: She exhibits no edema and no tenderness.   Neurological: She is alert and oriented to person, place, and time. She has normal strength. No cranial nerve deficit or sensory deficit. Coordination and gait normal.   Skin: Skin is warm and dry. No rash noted. No erythema.     EKG Interpretation:  Not necessary  Lab Review NA       Assessment:       45 y.o. patient with planned surgery approved for Surgery    Known risk factors for perioperative complications: None       Plan:     1. Preoperative workup as follows: none  2. Change in medication regimen before surgery: Take blood pressure medicine on morning of surgery with sip of water, and hold all other medications until after surgery  3. No contraindications to planned surgery.

## 2011-01-17 NOTE — Telephone Encounter (Signed)
Requesting for H&P to be faxed  Pt is scheduled for surgery tomorrow  FX: [phone]

## 2011-01-17 NOTE — Telephone Encounter (Signed)
Faxed info

## 2011-01-30 MED ORDER — METOPROLOL SUCCINATE ER 25 MG PO TB24
25 MG | ORAL_TABLET | Freq: Two times a day (BID) | ORAL | Status: DC
Start: 2011-01-30 — End: 2011-06-30

## 2011-03-30 NOTE — Unmapped (Signed)
Dr Marry Guan can you see sooner

## 2011-03-30 NOTE — Unmapped (Signed)
The patient husband is calling regarding his wife. She seems to be declining since she had a mini stroke last year. She seemed to get better but now things are getting worse again with memory and coordination. I offered to schedule the patient with Dr. Nicholaus Bloom but first available is in April. He is asking if it is possible for her to be seen from someone else??

## 2011-04-03 NOTE — Unmapped (Signed)
There is an opening with Dr Marry Guan 02/18. Called spouse and left a message to return my call

## 2011-04-03 NOTE — Unmapped (Signed)
rtn MA call - [phone]

## 2011-04-03 NOTE — Unmapped (Signed)
Pt is a follow up not new patient. Scheduled appt with NP bratt

## 2011-04-06 ENCOUNTER — Ambulatory Visit: Admit: 2011-04-06 | Payer: PRIVATE HEALTH INSURANCE

## 2011-04-06 DIAGNOSIS — R413 Other amnesia: Secondary | ICD-10-CM
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
## 2011-04-11 MED ORDER — LANSOPRAZOLE 30 MG PO CPDR
30 MG | ORAL_CAPSULE | ORAL | Status: DC
Start: 2011-04-11 — End: 2011-10-01

## 2011-04-13 NOTE — Progress Notes (Signed)
Subjective:      Patient ID: Rebecca Jenkins is a 46 y.o. female.    HPI Lesion on left ear, rash from left ear to chest across to right ear since Tuesday, clear fluid drained from lesion last night. There was a little crust and it was slightly painful.  Complains of abdominal pains, nausea, decreased appetite. She has IBS. These symptoms have just been a few days. She has soft stools. Her BM's are irregular. Sometimes she will go 2 weeks without a BM. She feels nauseous but no vomiting. She is supposed to be taking amitiza and miralax per GI.    Review of Systems   Constitutional: Negative for fever, chills and appetite change.   Respiratory: Negative for shortness of breath.    Cardiovascular: Negative for chest pain.   Gastrointestinal: Positive for nausea, abdominal pain and constipation. Negative for vomiting and diarrhea.   Genitourinary: Negative for dysuria, urgency, frequency, hematuria and difficulty urinating.   Musculoskeletal: Negative for back pain.   Skin:        Lesion on left ear         Objective:   Physical Exam   Constitutional: She is oriented to person, place, and time. Vital signs are normal. She appears well-developed and well-nourished. She is cooperative.   Cardiovascular: Normal rate, regular rhythm and normal heart sounds.    No murmur heard.  Pulmonary/Chest: Effort normal and breath sounds normal. No respiratory distress. She has no decreased breath sounds.   Abdominal: Soft. Normal appearance and bowel sounds are normal. She exhibits no distension and no mass. There is no hepatosplenomegaly. There is tenderness in the epigastric area and periumbilical area. There is no rigidity, no rebound, no guarding and no CVA tenderness. No hernia.   Neurological: She is alert and oriented to person, place, and time.   Skin: Skin is warm, dry and intact.   Left ear with a little crust, otherwise appears normal       Assessment:      Rebecca Jenkins was seen today for nausea, abdominal pain and  lesion(s).    Diagnoses and associated orders for this visit:    Ear lesion    Abdominal pain, chronic, generalized    Constipation               Plan:      Get abdominal films. Probable constipation.

## 2011-04-13 NOTE — Patient Instructions (Signed)
Rebecca Jenkins was seen today for nausea, abdominal pain and lesion(s).    Diagnoses and associated orders for this visit:    Ear lesion    Abdominal pain, chronic, generalized    Constipation         Get abdominal xray.

## 2011-04-13 NOTE — Telephone Encounter (Signed)
Per Terri with [facility] calling in regards to patient upcoming Pre-op scheduled for 04/17/11 with AJ  Wants to make sure that the H&P gets sent to right place when done.    Fax: [phone]  Ph: [phone]

## 2011-04-17 LAB — CBC WITH AUTO DIFFERENTIAL
Basophils %: 0.3 % (ref 0.0–2.0)
Basophils Absolute: 0 10*3 (ref 0.0–0.2)
Eosinophils %: 1.5 % (ref 0.0–5.0)
Eosinophils Absolute: 0.1 10*3 (ref 0.0–0.6)
Granulocyte Absolute Count: 4 10*3 (ref 1.7–7.7)
Hematocrit: 38.1 % (ref 36.0–48.0)
Hemoglobin: 12.9 gm/dl (ref 12.0–16.0)
Lymphocytes %: 31.5 % (ref 25.0–40.0)
Lymphocytes Absolute: 2.1 10*3 (ref 1.0–5.1)
MCH: 29.6 pg (ref 26–34)
MCHC: 34 gm/dl (ref 31–36)
MCV: 87.1 fl (ref 80–100)
MPV: 9.2 fl (ref 5.0–10.5)
Monocytes %: 6.6 % (ref 0.0–12.0)
Monocytes Absolute: 0.4 10*3 (ref 0.0–1.3)
Platelets: 293 10*3 (ref 135–450)
RBC: 4.37 10*6 (ref 4.0–5.2)
RDW: 14.3 % (ref 11.5–14.5)
Segs Relative: 60.1 % (ref 42.0–63.0)
WBC: 6.6 10*3 (ref 4.0–11.0)

## 2011-04-17 LAB — CBC AND DIFFERENTIAL
Basophils %, CSF: 0.3 % (ref 0.0–2.0)
Basophils Absolute: 0 10*3 (ref 0.0–0.2)
Eosinophils Absolute: 0.1 10*3 (ref 0.0–0.6)
Eosinophils, CSF: 1.5 % (ref 0.0–5.0)
Hematocrit: 38.1 % (ref 36.0–48.0)
Hemoglobin: 12.9 g/dL (ref 12.0–16.0)
Lymphocytes Absolute: 2.1 10*3 (ref 1.0–5.1)
Lymphocytes Relative: 31.5 % (ref 25.0–40.0)
MCH: 29.6 pg (ref 26–34)
MCHC: 34 g/dL (ref 31–36)
MCV: 87.1 fl (ref 80–100)
MPV: 9.2 fl (ref 5.0–10.5)
Monocytes Absolute: 0.4 10*3 (ref 0.0–1.3)
Monocytes Relative: 6.6 % (ref 0.0–12.0)
Neutrophils Absolute: 4 10*3 (ref 1.7–7.7)
Neutrophils Relative: 60.1 % (ref 42.0–63.0)
PLT Morphology: 293 10*3 (ref 135–450)
RBC: 4.37 10*6 (ref 4.0–5.2)
RDW: 14.3 % (ref 11.5–14.5)
WBC: 6.6 10*3 (ref 4.0–11.0)

## 2011-04-17 NOTE — Telephone Encounter (Signed)
faxed

## 2011-04-17 NOTE — Patient Instructions (Addendum)
Rebecca Jenkins was seen today for pre-op exam.    Diagnoses and associated orders for this visit:    Preop examination    Carpal tunnel syndrome    Other Orders  - QUEtiapine (SEROQUEL) 300 MG tablet; Take 300 mg by mouth every evening.  - amphetamine-dextroamphetamine (ADDERALL XR) 30 MG XR capsule; Take 60 mg by mouth every morning.  - leflunomide (ARAVA) 20 MG tablet; Take 20 mg by mouth daily.  - pregabalin (LYRICA) 50 MG capsule; Take 50 mg by mouth as needed.  - aspirin 81 MG tablet; Take 81 mg by mouth daily.         Cleared for surgery.  Gave her Linzess to try for IBS with constipation.

## 2011-04-17 NOTE — Progress Notes (Signed)
Preoperative Consultation    Rebecca Jenkins  Date of Birth:  08/24/1965  Date of Service:  04/17/2011    Chief Complaint   Patient presents with   . Pre-op Exam     04/19/2011, [facility] [address], Dr. Arliss Journey, right elbow and hand sx       This patient presents to the office today for a preoperative consultation at the request of surgeon, Dr. Arliss Journey, who plans on performing right elbow/hand cyst removal on January 23 at [facility].  She has right carpal tunnel syndrome and cysts on right elbow and hands. No other treatment measures have been taken.    Planned anesthesia: General   Known anesthesia problems: She wakes up very easily  Bleeding risk: No recent or remote history of abnormal bleeding  Personal or FH of DVT/PE: No      Patient Active Problem List   Diagnosis   . Other chronic nonalcoholic liver disease   . Tobacco use disorder   . Rheumatoid arthritis   . Unspecified essential hypertension   . Esophageal reflux       Past Medical History   Diagnosis Date   . Arthritis      RHEUMATOID   . Anxiety    . Hypertension    . ADHD    . GERD (gastroesophageal reflux disease)    . Constipation    . Irritable bowel syndrome    . Scoliosis    . Fibromyalgia      Past Surgical History   Procedure Laterality Date   . Cesarean section       X3   . Cholecystectomy  05/03   . Liver biopsy       NON ALCOHOLIC CIRRHOSIS   . Tubal ligation     . Breast enhancement surgery  2001   . Back surgery       L1-2       Allergies   Allergen Reactions   . Codeine    . Other      ORAL PAIN MED ? NAME   . Hydrocodone Nausea Only   . Oxycodone Nausea Only     No outpatient prescriptions have been marked as taking for the 04/17/11 encounter (Office Visit) with Ulyses Amor, PA.       History   Substance Use Topics   . Smoking status: Current Everyday Smoker -- 0.50 packs/day     Types: Cigarettes   . Smokeless tobacco: Never Used   . Alcohol Use: 0.5 oz/week     1 drink(s) per week      rarely, usually no more than 2  drinks at a time       Review of Systems  A comprehensive review of systems was negative except for what was noted in the HPI.     Physical Exam   There were no vitals taken for this visit.      Constitutional: She is oriented to person, place, and time. She appears well-developed and well-nourished. No distress.   HENT:   Head: Normocephalic and atraumatic.   Mouth/Throat: Uvula is midline, oropharynx is clear and moist and mucous membranes are normal.   Eyes: Conjunctivae and EOM are normal. Pupils are equal, round, and reactive to light.   Neck: Trachea normal and normal range of motion. Neck supple. No JVD present. Carotid bruit is not present. No mass and no thyromegaly present.   Cardiovascular: Normal rate, regular rhythm, normal heart sounds and intact distal  pulses.  Exam reveals no gallop and no friction rub.  No murmur heard.  Pulmonary/Chest: Effort normal and breath sounds normal. No respiratory distress. She has no wheezes. She has no rales.   Abdominal: Soft. Normal aorta and bowel sounds are normal. She exhibits no distension and no mass. There is no hepatosplenomegaly. No tenderness.   Musculoskeletal: She exhibits no edema and no tenderness.   Neurological: She is alert and oriented to person, place, and time. She has normal strength. No cranial nerve deficit or sensory deficit. Coordination and gait normal.   Skin: Skin is warm and dry. No rash noted. No erythema.     EKG Interpretation: not necessary    Lab Review NA       Assessment:       There are no diagnoses linked to this encounter.  46 y.o. patient  approved for Surgery         Plan:     1. Preoperative workup as follows: none  2. Change in medication regimen before surgery: Take prevacid and blood pressure medicines on morning of surgery with sip of water, and hold all other medications until after surgery  3. No contraindications to planned surgery

## 2011-04-24 NOTE — Unmapped (Signed)
Called and informed pt

## 2011-04-24 NOTE — Unmapped (Signed)
Message copied by Jaquita Rector on Mon Apr 24, 2011  4:21 PM  ------       Message from: Claudell Kyle       Created: Wed Apr 19, 2011  1:09 PM       Regarding: labs       Contact: [phone]         plz notify pt of normal labs       ----- Message -----          From: Lab In [place] Interface          Sent: 04/17/2011   7:46 PM            To: Claudell Kyle, CNP

## 2011-05-04 ENCOUNTER — Inpatient Hospital Stay: Payer: PRIVATE HEALTH INSURANCE

## 2011-05-04 LAB — RENAL FUNCTION PANEL
Albumin: 4.1 gm/dl (ref 3.4–5.0)
BUN: 9 mg/dl (ref 7–18)
CO2: 31 mEq/L (ref 21–32)
Calcium: 9.5 mg/dl (ref 8.3–10.6)
Chloride: 105 mEq/L (ref 99–110)
Creatinine: 0.8 mg/dl (ref 0.6–1.1)
GFR Est, African/Amer: >60
GFR, Estimated: >60 (ref >60)
Glucose: 95 mg/dl (ref 70–99)
Phosphorus: 4.2 mg/dl (ref 2.5–4.9)
Potassium: 4 mEq/L (ref 3.5–5.1)
Sodium: 140 mEq/L (ref 136–145)

## 2011-05-04 LAB — RENAL FUNCTION PANEL W/EGFR
Albumin: 4.1 gm/dl (ref 3.4–5.0)
BUN: 9 mg/dl (ref 7–18)
CO2: 31 mEq/L (ref 21–32)
Calcium: 9.5 mg/dl (ref 8.3–10.6)
Chloride: 105 mEq/L (ref 99–110)
Creatinine: 0.8 mg/dl (ref 0.6–1.1)
GFR MDRD Af Amer: >60
GFR MDRD Non Af Amer: >60 (ref >60)
Glucose: 95 mg/dl (ref 70–99)
Phosphorus: 4.2 mg/dl (ref 2.5–4.9)
Potassium: 4 mEq/L (ref 3.5–5.1)
Sodium: 140 mEq/L (ref 136–145)

## 2011-05-04 NOTE — Unmapped (Signed)
Called and spoke with pt.  Lab order faxed to [facility] @ [phone]    Pt informed.

## 2011-05-04 NOTE — Unmapped (Signed)
Pt calling re: blood work - needs call back asap

## 2011-05-17 ENCOUNTER — Inpatient Hospital Stay: Payer: PRIVATE HEALTH INSURANCE

## 2011-05-29 ENCOUNTER — Inpatient Hospital Stay: Admit: 2011-05-29 | Payer: PRIVATE HEALTH INSURANCE

## 2011-05-29 DIAGNOSIS — R413 Other amnesia: Secondary | ICD-10-CM

## 2011-05-29 MED ORDER — GADAVIST (gadobutrol) Soln 6 mL
1 | Freq: Once | INTRAVENOUS | Status: AC | PRN
Start: 2011-05-29 — End: 2011-05-29
  Administered 2011-05-29: 22:00:00 via INTRAVENOUS

## 2011-05-29 MED FILL — GADOBUTROL 1 MMOL/ML (604.72 MG/ML) INTRAVENOUS SOLUTION: 1 1 mmol/mL (604.72 mg/mL) | INTRAVENOUS | Qty: 10

## 2011-06-12 NOTE — Telephone Encounter (Signed)
Pt and her husband have work physical appts Friday and need lab orders. She said her husband faxed form to [place] from work that stated what labs they needed done and we should have them (?). Can you fax the lab order to her husband's work at [phone] (secured fax).

## 2011-06-13 ENCOUNTER — Ambulatory Visit: Admit: 2011-06-13 | Discharge: 2011-06-13 | Payer: PRIVATE HEALTH INSURANCE

## 2011-06-13 NOTE — Telephone Encounter (Signed)
faxed
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
## 2011-06-15 ENCOUNTER — Ambulatory Visit: Admit: 2011-06-15 | Payer: PRIVATE HEALTH INSURANCE

## 2011-06-15 DIAGNOSIS — F988 Other specified behavioral and emotional disorders with onset usually occurring in childhood and adolescence: Secondary | ICD-10-CM

## 2011-06-15 LAB — BASIC METABOLIC PANEL
BUN: 9 mg/dL (ref 7–25)
CO2: 27 mmol/L (ref 19–30)
Calcium: 9.4 mg/dL (ref 8.6–10.2)
Chloride: 103 mmol/L (ref 98–110)
Creatinine: 0.78 mg/dL (ref 0.50–1.10)
Est, Glom Filt Rate: 91 mL/min/{1.73_m2} (ref >=60)
Glucose: 96 mg/dL (ref 65–99)
Potassium: 3.7 mmol/L (ref 3.5–5.3)
Sodium: 141 mmol/L (ref 135–146)
eGFR African American: 105 mL/min/{1.73_m2} (ref >=60)

## 2011-06-15 LAB — LIPID PANEL
Chol/HDL Ratio: 5 (calc) (ref <=5.0)
Cholesterol, Total: 190 mg/dL (ref 125–200)
HDL: 38 mg/dL — ABNORMAL LOW (ref >=46)
LDL Calculated: 109 mg/dL (calc) (ref <130)
Non-HDL Cholesterol: 152 mg/dL (calc)
Triglycerides: 214 mg/dL — ABNORMAL HIGH (ref <150)
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
## 2011-06-15 NOTE — Unmapped (Signed)
1) [email or web address]

## 2011-06-16 NOTE — Patient Instructions (Addendum)
Larae was seen today for employment physical.    Diagnoses and associated orders for this visit:    Preventative health care    Hypertriglyceridemia    Need for prophylactic vaccination with tetanus-diphtheria (TD)  - Tdap vaccine greater than or equal to 47yo IM         Fish oil daily, reviewed labs.

## 2011-06-16 NOTE — Progress Notes (Signed)
Subjective:      Patient ID: Rebecca Jenkins is a 46 y.o. female.    HPI  Patient is here for a work/ins physical. She has no complaints other than her IBS that is constantly flaring up. She exercises regularly. She has periodic diarrhea due to IBS. No urinating issues. She has a good appetite. She sleeps well.  Review of Systems   Constitutional: Negative for appetite change, fatigue and unexpected weight change.   Eyes: Negative for visual disturbance.   Respiratory: Negative for cough and shortness of breath.    Cardiovascular: Negative for chest pain, palpitations and leg swelling.   Gastrointestinal: Positive for diarrhea. Negative for nausea, vomiting and abdominal pain.   Genitourinary: Negative for difficulty urinating.   Musculoskeletal: Negative for back pain.   Skin: Negative for rash.   Neurological: Negative for dizziness, weakness and headaches.   Psychiatric/Behavioral: Negative for disturbed wake/sleep cycle. The patient is not nervous/anxious.        Objective:   Physical Exam   Constitutional: She is oriented to person, place, and time. Vital signs are normal. She appears well-developed and well-nourished. She is cooperative.   HENT:   Right Ear: Tympanic membrane and ear canal normal.   Left Ear: Tympanic membrane and ear canal normal.   Nose: Nose normal.   Mouth/Throat: Uvula is midline, oropharynx is clear and moist and mucous membranes are normal.   Eyes: Conjunctivae are normal. Pupils are equal, round, and reactive to light.   Neck: Neck supple. No thyromegaly present.   Cardiovascular: Normal rate, regular rhythm and normal heart sounds.    No murmur heard.  Pulmonary/Chest: Effort normal and breath sounds normal. No respiratory distress. She has no decreased breath sounds.   Abdominal: Soft. Normal appearance and bowel sounds are normal. She exhibits no distension and no mass. There is no hepatosplenomegaly. There is no tenderness. There is no rigidity, no rebound, no guarding and no  CVA tenderness.   Musculoskeletal: Normal range of motion. She exhibits no edema.        Right foot: Normal.        Left foot: Normal.   Lymphadenopathy:     She has no cervical adenopathy.   Neurological: She is alert and oriented to person, place, and time. She has normal reflexes. No sensory deficit.   Skin: Skin is warm, dry and intact. No rash noted.   Psychiatric: She has a normal mood and affect. Her speech is normal and behavior is normal. Thought content normal.       Assessment:      Rebecca Jenkins was seen today for employment physical.    Diagnoses and associated orders for this visit:    Preventative health care    Hypertriglyceridemia    Need for prophylactic vaccination with tetanus-diphtheria (TD)  - Tdap vaccine greater than or equal to 7yo IM               Plan:      Reviewed labs, adacel today. Return in 6 months.

## 2011-06-16 NOTE — Progress Notes (Signed)
I have reviewed the history and physical note and findings.

## 2011-06-20 ENCOUNTER — Ambulatory Visit: Admit: 2011-06-20 | Discharge: 2011-06-20 | Payer: PRIVATE HEALTH INSURANCE

## 2011-06-20 DIAGNOSIS — R413 Other amnesia: Secondary | ICD-10-CM
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
## 2011-06-30 MED ORDER — METOPROLOL SUCCINATE ER 25 MG PO TB24
25 MG | ORAL_TABLET | Freq: Two times a day (BID) | ORAL | Status: DC
Start: 2011-06-30 — End: 2011-12-18

## 2011-07-12 NOTE — Unmapped (Signed)
07/12/11 sent Dr Dicie Beam neuropsych report dated 06/20/11 to Dr Gilman Schmidt via mail to [address], [address],per release signed by patient. pj

## 2011-07-26 NOTE — Telephone Encounter (Signed)
Pt wants to know date and type of last tetanus shot.

## 2011-07-26 NOTE — Telephone Encounter (Signed)
Left message for pt to call

## 2011-07-27 NOTE — Telephone Encounter (Signed)
Discussed with patient.

## 2011-08-24 ENCOUNTER — Encounter

## 2011-08-24 LAB — PROTIME/INR & PTT
INR: 0.92
Protime: 10.5 s (ref 10.0–12.8)
aPTT: 33.6 s

## 2011-08-24 LAB — CBC WITH AUTO DIFFERENTIAL
Basophils %: 0.4 % (ref 0.0–2.0)
Basophils Absolute: 0 10*3 (ref 0.0–0.2)
Eosinophils %: 1 % (ref 0.0–5.0)
Eosinophils Absolute: 0.1 10*3 (ref 0.0–0.6)
Granulocyte Absolute Count: 4.1 10*3 (ref 1.7–7.7)
Hematocrit: 36.7 % (ref 36.0–48.0)
Hemoglobin: 12 gm/dl (ref 12.0–16.0)
Lymphocytes %: 33.6 % (ref 25.0–40.0)
Lymphocytes Absolute: 2.4 10*3 (ref 1.0–5.1)
MCH: 28.8 pg (ref 26–34)
MCHC: 32.7 gm/dl (ref 31–36)
MCV: 88.1 fl (ref 80–100)
MPV: 8 fl (ref 5.0–10.5)
Monocytes %: 6.6 % (ref 0.0–12.0)
Monocytes Absolute: 0.5 10*3 (ref 0.0–1.3)
Platelets: 340 10*3 (ref 135–450)
RBC: 4.17 10*6 (ref 4.0–5.2)
RDW: 13.1 % (ref 11.5–14.5)
Segs Relative: 58.4 % (ref 42.0–63.0)
WBC: 7.1 10*3 (ref 4.0–11.0)

## 2011-08-24 NOTE — Progress Notes (Signed)
Preoperative Consultation    Rebecca Jenkins  Date of Birth:  24-Dec-1965  Date of Service:  08/24/2011    Chief Complaint   Patient presents with   ??? Pre-op Exam       This patient presents to the office today for a preoperative consultation at the request of surgeon, Dr. Clair Gulling, who plans on performing scar revision  on June 6 at [facility].  Last surgery was in 2012 but getting a revision on scalp.    Planned anesthesia: General   Known anesthesia problems: Difficult to get to sleep  Bleeding risk: No recent or remote history of abnormal bleeding  Personal or FH of DVT/PE: No      Patient Active Problem List   Diagnosis   ??? Other chronic nonalcoholic liver disease   ??? Tobacco use disorder   ??? Rheumatoid arthritis   ??? Unspecified essential hypertension   ??? Esophageal reflux       Past Surgical History   Procedure Laterality Date   ??? Cesarean section       X3   ??? Cholecystectomy  05/03   ??? Liver biopsy       NON ALCOHOLIC CIRRHOSIS   ??? Tubal ligation     ??? Breast enhancement surgery  2001   ??? Back surgery       L1-2   ??? Knee surgery       left   ??? Elbow surgery       left ulnar nerve       Allergies   Allergen Reactions   ??? Codeine    ??? Other      ORAL PAIN MED ? NAME   ??? Hydrocodone Nausea Only   ??? Oxycodone Nausea Only     No outpatient prescriptions have been marked as taking for the 08/24/11 encounter (Office Visit) with Ulyses Amor, PA.       History   Substance Use Topics   ??? Smoking status: Current Everyday Smoker -- 0.50 packs/day     Types: Cigarettes   ??? Smokeless tobacco: Never Used   ??? Alcohol Use: 0.5 oz/week     1 drink(s) per week      rarely, usually no more than 2 drinks at a time       Review of Systems  A comprehensive review of systems was negative except for what was noted in the HPI.     Physical Exam   There were no vitals taken for this visit.      Constitutional: She is oriented to person, place, and time. She appears well-developed and well-nourished.  No distress.   HENT:   Head: Normocephalic and atraumatic.   Mouth/Throat: Uvula is midline, oropharynx is clear and moist and mucous membranes are normal.   Eyes: Conjunctivae and EOM are normal. Pupils are equal, round, and reactive to light.   Neck: Trachea normal and normal range of motion. Neck supple. No JVD present. Carotid bruit is not present. No mass and no thyromegaly present.   Cardiovascular: Normal rate, regular rhythm, normal heart sounds and intact distal pulses.  Exam reveals no gallop and no friction rub.  No murmur heard.  Pulmonary/Chest: Effort normal and breath sounds normal. No respiratory distress. She has no wheezes. She has no rales.   Abdominal: Soft. Normal aorta and bowel sounds are normal. She exhibits no distension and no mass. There is no hepatosplenomegaly. No tenderness.   Musculoskeletal: She exhibits no  edema and no tenderness.   Neurological: She is alert and oriented to person, place, and time. She has normal strength. No cranial nerve deficit or sensory deficit. Coordination and gait normal.   Skin: Skin is warm and dry. No rash noted. No erythema.     EKG Interpretation:  normal EKG, normal sinus rhythm.    Lab Review: ordered cbc w/diff, protime and CXR       Assessment:       There are no diagnoses linked to this encounter.  46 y.o. patient  approved for Surgery         Plan:     1. Preoperative workup as follows: ECG, cbc w/diff, PT/PTT/INR, CXR  2. Change in medication regimen before surgery: Take prevacid the morning of procedure with a sip of water.  3. No contraindications to planned surgery  4. Will clear for surgery after I get labs and CXR back

## 2011-08-24 NOTE — Progress Notes (Signed)
I have reviewed the history and physical note and findings.

## 2011-08-24 NOTE — Patient Instructions (Signed)
Rebecca Jenkins was seen today for pre-op exam.    Diagnoses and associated orders for this visit:    Preop examination  - EKG 12 lead    Elective surgery         Will clear after I get labs and CXR results.

## 2011-08-28 NOTE — Telephone Encounter (Signed)
Pt was in the office for a preop px  States she was told her physical form, xray results, & b/w would be faxed to surgeon but they have not received it  Please fax to Dr. Lubertha SayresJohn Jenkins    Pt would like a call when this is done

## 2011-08-28 NOTE — Telephone Encounter (Signed)
Discussed with patient that papers were faxed.

## 2011-10-02 MED ORDER — LANSOPRAZOLE 30 MG PO CPDR
30 MG | ORAL_CAPSULE | ORAL | Status: DC
Start: 2011-10-02 — End: 2012-03-31

## 2011-12-08 NOTE — Progress Notes (Signed)
This patient comes in today for a cast check and removal.  She's a current patient of Dr. Veto Kemps.  He is currently treating her for a distal radius fracture and rheumatoid arthritis in her left wrist.  The patient reports that she recently got the cast very well and has been bothering her since then.  She also reports it is quite loose and is coming in today for a cast check.    On physical exam the cast itself is poorly fitting and very loose this point.  She can almost completely slight of her wrist at this time.  Neurovascular exam is intact.  The cast was removed and she does show some deformity of the wrist secondary to her rheumatoid arthritis.  She is still tender over the distal radius.  She has limited range of motion secondary to pain and osteoarthritis of the wrist.the skin is intact.    X-rays were taken today including AP, lateral, and oblique views of the left wrist.  There does appear to be a resolving, progressively healing distal radius fracture.  She does have severe arthritic changes of the left wrist consistent with her rheumatoid arthritis.    Today I had a very long discussion with the patient as well as her husband regarding the rheumatoid arthritis and the treatment options for the distal radius fracture.  This does appear to be healing at this point she is very concerned on its affect on her rheumatoid arthritis and is afraid that the fracture may displace or become located in any other fashion.  When she last saw Dr. Vickii Penna approximately 3 weeks ago did recommend seeing her back at that time for cast off x-ray.  At this time I recommend placing her in a fracture brace which will provide further support the she needs further fracture and also will give her adequate support from the rheumatoid arthritis.  We'll have her followup with Dr. Vickii Penna on her previously scheduled appointment later this month.

## 2011-12-18 MED ORDER — LINACLOTIDE 290 MCG PO CAPS
290 MCG | ORAL_CAPSULE | Freq: Every day | ORAL | Status: DC
Start: 2011-12-18 — End: 2013-06-30

## 2011-12-18 MED ORDER — METOPROLOL SUCCINATE ER 25 MG PO TB24
25 MG | ORAL_TABLET | Freq: Two times a day (BID) | ORAL | Status: DC
Start: 2011-12-18 — End: 2013-01-09

## 2011-12-18 MED ORDER — METFORMIN HCL 500 MG PO TABS
500 MG | ORAL_TABLET | Freq: Two times a day (BID) | ORAL | Status: DC
Start: 2011-12-18 — End: 2012-06-27

## 2011-12-18 NOTE — Progress Notes (Signed)
Assessment: 6 week followup after her minimally displaced distal radius fracture in a lady with very severe rheumatoid arthritic changes in this left wrist    Treatment Plan: Start in therapy.  She has a removableExos splinted she is going to tape her out of.  Return visit for weeks repeat x-rays    No Follow-up on file.         History of Present Illness  Rebecca Jenkins is a 46 y.o. female.  Patient is now 6 weeks out from an injury to her left wrist.  She has severe rheumatoid arthritis and had a nondisplaced fracture of the distal radius.  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  She was seen in after hours clinic last week she states her cast was degenerating between the thumb and index finger.  I removed the cast and placed her in an Exos splint    Review of Systems  Complete Review of Systems performed and is non-contributory except for what is noted in HPI.    Vital Signs  Filed Vitals:    12/18/11 1511   Height: 5\' 4"  (1.626 m)   Weight: 121 lb (54.885 kg)     Body mass index is 20.76 kg/(m^2).     Physical Exam  Constitutional:  Patient is well-nourished and demonstrates normal hygiene.  Mental Status:  Patient is alert and oriented to person, place and time.  Skin:  Intact, no rashes or lesions.    Hand Examination: Swelling is decreasing in the wrist.  Mild tenderness over the distal radius.  She is actually more tender over the distal ulna    Additional Comments:     Additional Examinations:  X-Ray Findings:  No significant displacement of the radius fracture in spite of bumping it this weekend  Additional Diagnostic Test Findings:    Office Procedures:    Impression: Think the fracture is beginning to heal.  With her underlying severe rheumatoid arthritis and radius scaphoid arthritis we are going to go ahead and start her in gentle early range of motion therapy.    Orders Placed This Encounter   Procedures   ??? X-ray wrist left PA lateral and oblique

## 2011-12-18 NOTE — Progress Notes (Signed)
Subjective:      Patient ID: Rebecca Jenkins is a 46 y.o. female.    HPI Patient is here today for her routine check up, also she needs medication refills. Patient states her psychiatrist gave her metformin "because she is on so many medications" and she feels better when she takes it. She said when she is not on it she craves sugar. She ran out of it several months ago.   She has been taking her Toprol but not bid as directed. She said that she checks her BP at home and it is usually 120-140/90-100. No chest pain, dizziness, SOB. She says she would really like to go off of the klonopin and seroquel but has so much trouble sleeping. She just saw her psychiatrist recently. Also saw rheumtology recently and had labs done.     Would like her notes from here sent to Dr Sandre Kitty her rheumatologist     BP 140/90 when rechecked    Review of Systems   Constitutional: Negative for fever, chills, fatigue and unexpected weight change.   HENT: Negative.  Negative for trouble swallowing.    Respiratory: Negative.  Negative for cough and shortness of breath.    Cardiovascular: Negative for chest pain, palpitations and leg swelling.        Elevated BP   Gastrointestinal: Positive for constipation. Negative for nausea and vomiting.   Musculoskeletal:        Left lower arm in brace healing from fracture   Skin: Negative for rash.   Neurological: Negative for dizziness and headaches.   Psychiatric/Behavioral: Positive for sleep disturbance. The patient is not nervous/anxious.        Objective:   Physical Exam   Nursing note and vitals reviewed.  Constitutional: She is oriented to person, place, and time. She appears well-developed and well-nourished. She is cooperative.   Elevated BP   HENT:   Head: Normocephalic and atraumatic.   Right Ear: External ear normal.   Left Ear: External ear normal.   Nose: Nose normal.   Neck: Normal range of motion. Neck supple. Carotid bruit is not present.   Cardiovascular: Normal rate, normal heart  sounds and intact distal pulses.    Pulmonary/Chest: Effort normal and breath sounds normal. No respiratory distress. She has no decreased breath sounds. She has no wheezes.   Musculoskeletal:   Left lower forearm in brace for a healing fracture   Neurological: She is alert and oriented to person, place, and time.       Assessment:      Rebecca Jenkins was seen today for follow-up.    Diagnoses and associated orders for this visit:    Unspecified essential hypertension    IBS (irritable bowel syndrome)    Tobacco use disorder    Rheumatoid arthritis    Other Orders  - metoprolol (TOPROL-XL) 25 MG XL tablet; Take 1 tablet by mouth 2 times daily.  - metFORMIN (GLUCOPHAGE) 500 MG tablet; Take 1 tablet by mouth 2 times daily (with meals).  - Linaclotide (LINZESS) 290 MCG CAPS; Take 290 mg by mouth daily.               Plan:      Resume metoprol as directed and return in 1 month for BP check, refilled metformin and linzess. Has had recent labs done through rheumaologist.

## 2011-12-18 NOTE — Patient Instructions (Addendum)
Rebecca Jenkins was seen today for follow-up.    Diagnoses and associated orders for this visit:    Unspecified essential hypertension    IBS (irritable bowel syndrome)    Tobacco use disorder    Rheumatoid arthritis    Other Orders  - metoprolol (TOPROL-XL) 25 MG XL tablet; Take 1 tablet by mouth 2 times daily.  - metFORMIN (GLUCOPHAGE) 500 MG tablet; Take 1 tablet by mouth 2 times daily (with meals).         Resume taking metoprolol as directed, return in 1 month.

## 2011-12-18 NOTE — Progress Notes (Signed)
I have reviewed the history and physical note and findings.

## 2011-12-18 NOTE — Patient Instructions (Signed)
Tape her out of splint.  Again in therapy

## 2012-04-01 MED ORDER — LANSOPRAZOLE 30 MG PO CPDR
30 MG | ORAL_CAPSULE | ORAL | Status: DC
Start: 2012-04-01 — End: 2012-11-04

## 2012-04-03 NOTE — Other (Unsigned)
ED PATIENT ENCOUNTER ARRIVAL: 04/03/12 1723             EMERGENCY DEPARTMENT ENCOUNTER NOTE     CHIEF COMPLAINT Chief Complaint Patient presents with   Abscess     HPI Rebecca Jenkins is a 47 year old female who presents with increasing   swelling and pain to the right elbow, in particular the olecranon bursa and   overlying skin. Patient states there's been malodorous pus draining from the   area since yesterday. This is become much worse over the last twenty-four   hours. The patient began having some shaking chills earlier this afternoon.   The pain is sharp, worsens with any movement of the arm and eases only   minimally with rest. She contacted her orthopedic surgeon's office who   instructed her to come to the emergency department for evaluation.     REVIEW OF SYSTEMS     Constitutional: See history of chief complaint Eyes:  Denies change in visual   acuity HENT:  Denies nasal congestion or sore throat Respiratory:  Denies   cough or shortness of breath Cardiovascular:  Denies chest pain or edema GI:    Denies abdominal pain, nausea, vomiting, bloody stools or diarrhea GU:  Denies   dysuria Musculoskeletal: See history of chief complaint Integument: See   history of chief complaint Neurologic:  Denies headache, focal weakness or   sensory changes Endocrine:  Denies polyuria or polydipsia Lymphatic:  Denies   swollen glands Psychiatric:  Denies depression or anxiety     PAST MEDICAL HISTORY Past Medical History Diagnosis Date   Rheumatoid   arthritis (HCC)   Unspecified essential hypertension     SURGICAL HISTORY Past Surgical History Procedure Laterality Date     Cholecystectomy   Cesarean section, classic   Back surgery   Elbow surgery     Hand surgery     CURRENT MEDICATIONS Prior to Admission medications Medication Sig Start Date   End Date Taking? Authorizing Provider ADDERALL XR, , CAP Take  by mouth 2   (two) times daily.   Yes Med, Historical lansoprazole (PREVACID) 30 MG CPDR   Take 30 mg by  mouth daily.   Yes Med, Historical clonazepam (KLONOPIN) 2 MG   TABS Take 4 mg by mouth nightly.   Yes Med, Historical predniSONE (DELTASONE)   5 MG TABS Take 1 tablet by mouth daily as needed. 02/07/12  Yes Koneru, [place]., MD Quetiapine Fumarate (SEROQUEL) 300 MG PO TABS 1 tab hs   Yes Med,   Historical Metformin HCl 850 MG PO TABS 2 daily   Yes Med, Historical   metoprolol tartrate (LOPRESSOR) 50 MG TABS TAKE ONE TABLET BY MOUTH TWICE A   DAY 04/02/12   Harlene Salts, Sander Nephew., MD leflunomide (ARAVA) 20 MG TABS TAKE ONE TABLET   BY MOUTH EVERY DAY 04/02/12 Ricki Miller., MD ranitidine (ZANTAC 75) 75 MG   TABS Take 1 tablet by mouth 2 (two) times daily. 03/01/12 04/03/12  Ricki Miller., MD RiTUXimab (RITUXAN IV) 1,000 mg by Intravenous route. 2 doses, 2 weeks   apart Indications: Rheumatoid Arthritis    Koneru, [place] L., MD ranitidine   (ZANTAC 75) 75 MG TABS Take 1 tablet by mouth 2 (two) times daily. 02/16/12   04/03/12  Ricki Miller., MD gabapentin (NEURONTIN) 100 MG CAPS TAKE ONE CAPSULE   BY MOUTH TWICE A DAY DURING THE DAY AND TAKE THREE CAPSULES BY MOUTH EVERY  NIGHT AT BEDTIME 01/16/12 04/03/12 Ricki MillerKoneru, [place] L., MD Omeprazole (PRILOSEC) 10   MG PO CPDR   04/03/12  Med, Historical Clonazepam 2 MG PO TABS 2-3 tablets daily    04/03/12  Med, Historical Amphetamine-Dextroamphetamine (ADDERALL) 20 MG PO   TABS 2 daily  04/03/12  Med, Historical Metoprolol Succinate 25 MG PO TB24 1-2   daily  04/03/12  Med, Historical Lubiprostone (AMITIZA) 24 MCG PO CAPS 1-2 daily    04/03/12  Med, Historical Triamterene-HCTZ 37.5-25 MG PO CAPS 1 daily  04/03/12    Med, Historical     ALLERGIES Allergies Allergen Reactions   Codeine     FAMILY HISTORY No family history on file.     SOCIAL HISTORY History     Social History   Marital Status: Married   Spouse Name: N/A   Number of   Children: N/A   Years of Education: N/A     Social History Main Topics   Smoking status: Current Every Day Smoker     Smokeless tobacco: Never Used   Alcohol Use: None   Drug  Use: None   Sexually   Active: None     Other Topics Concern   None     Social History Narrative   None         PHYSICAL EXAM VITAL SIGNS: BP 155/94   Pulse 120   Temp(Src) 98.4  F (36.9    C) (Oral)   Resp 19   Ht 64" (162.6 cm)   Wt 126 lb (57.153 kg)   BMI 21.62   kg/m2   SpO2 96% Constitutional: Thin female, moderate acute pain,   systemically ill. Head:  Normocephalic, atraumatic. Eyes:  PERRL, EOMI,   Conjunctiva normal, No discharge. ENT:   EACs normal, TMs normal. The nares   are clear bilaterally. No pharyngeal erythema or exudate. Neck: Normal range   of motion, No tenderness, no adenopathy. Respiratory: Breath sounds equal   bilaterally, few scattered rhonchi. Cardiovascular: Tachycardic, Normal   rhythm, peripheral pulses 2/4 and symmetric. GI:  Bowel sounds normal, Soft,   No tenderness, No masses, rebound, rigidity or guarding. Musculoskeletal:   Extremities intact x4. Marked chronic changes consistent with moderate to   severe rheumatoid arthritis. There is a large fluid collection in the right   olecranon bursa. Integument:  Warm, Dry, marked erythema over the area of the   right olecranon bursa. There are two lesions which appear to be ruptured   bullae. No active purulent drainage is noted. Neurologic:  Alert & oriented x   3, Normal motor function, Normal sensory function, No focal deficits noted.     EKG Sinus tachycardia, rate one nineteen, normal axes and intervals, no acute   ST-T segment changes. Read and reviewed by me.     LABORATORY Recent Results (from the past 12 hour(s)) BAMP   (NA,K,CL,CO2,GLU,BUN,CREAT,CA)  Collection Time  04/03/12  5:57 PM     Rebecca Jenkins   Value Range  BLD UREA NITROGEN 8  8 - 26 mg/dL  SODIUM 161137  096135 - 045145 mEq/L    POTASSIUM 3.2 (*) 3.6 - 5.1 mEq/L  CHLORIDE 104  101 - 111 mEq/L  CO2 26  24 -   36 mEq/L  GLUCOSE, RANDOM 168 (*) 74 - 99 mg/dL  CREATININE 4.090.74  8.110.44 - 1.03   mg/dL  OSMOLALITY CALC 914276 (*) 280 - 300 mOSM/kg  CALCIUM 8.8  8.5 - 10.5   mg/dL  GFR NON-AFRICAN  AMER >60  >  60 mL/min/1.73 sq meter  GFR AFRICAN   AMERICAN >60  >60 mL/min/1.73 sq meter CBC WITH DIFFERENTIAL  Collection Time    04/03/12  5:57 PM     Rebecca Jenkins Value Range  WBC 9.3  3.6 - 10.5 THOU/mcL  RBC   4.19  3.80 - 5.20 MIL/mcL  HEMOGLOBIN 12.7  12.0 - 15.2 g/dL  HEMATOCRIT 36    36 - 46 %  MCV 87  82 - 97 fL  MCH 30  27 - 33 pg  MCHC 35  32 - 36 g/dL  RDW   16.1  <09.6 %  PLATELET 247  140 - 375 THOU/mcL  SEGS 75  LYMPHOCYTES 12    MONOCYTES 12  EOSINOPHIL 1  BASOPHILS 0  ABS. NEUTROPHIL 6.90  1.80 - 7.70   THOU/mcL  ABS LYMPHS 1.10  1.00 - 4.00 THOU/mcL  ABS MONOS 1.10 (*) 0.20 -   0.90 THOU/mcL  ABS EOS 0.10  0.03 - 0.45 THOU/mcL  ABS BASOS 0.00 (*) 0.01 -   0.07 THOU/mcL PROTIME & INR  Collection Time  04/03/12  5:57 PM     Rebecca Jenkins Value   Range  PROTIME 10.2  9.0 - 11.4 Seconds  INR 1.0  0.8 - 1.2 PTT  Collection   Time  04/03/12  5:57 PM     Rebecca Jenkins Value Range  PTT 30.0  23.0 - 32.5 Seconds   SED RATE  Collection Time  04/03/12  5:57 PM     Rebecca Jenkins Value Range  ESR 40 (*)   0 - 25 mm/hr LACTIC ACID  Collection Time  04/03/12  5:57 PM     Rebecca Jenkins Value   Range  LACTIC ACID 1.8  0.5 - 2.2 mmol/L URINALYSIS, STAT  Collection Time    04/03/12  6:17 PM     Rebecca Jenkins Value Range  UA SPECIMEN SOURCE URINE CLEAN CATCH    COLOR YELLOW  YELLOW  APPEARANCE, URINE CLEAR  CLEAR  PH, URINE 6.5  5.0 - 8.0    SPEC. GRAVITY, URINE LESS THAN OR EQUAL TO 1.005  1.005 - 1.029  PROTEIN,   URINE NEGATIVE  NEGATIVE  GLUCOSE, URINE TRACE (*) NEGATIVE  KETONE, URINE   NEGATIVE  NEGATIVE  BILIRUBIN, URINE NEGATIVE  NEGATIVE  BLOOD, URINE NEGATIVE    NEGATIVE  UROBILINOGEN, URINE 0.2  <2.0 mg/dL  NITRITE, URINE NEGATIVE    NEGATIVE  LEUKOCYTE ESTERASE, URINE NEGATIVE  NEGATIVE         RADIOLOGY XR CHEST AP PORTABLE  Final Rebecca Jenkins:      No acute findings.  14 mm nodular opacity RIGHT upper lobe could represent   pulmonary nodule.  Followup CT scan recommended.          no acute change on preliminary reading by me     ED COURSE & MEDICAL DECISION  MAKING Pertinent Labs & Imaging studies   reviewed. (See chart for details)     IV access was obtained. The patient was started on IV fluids. She was   medicated for pain and nausea. She appears to have an infected olecranon   bursitis with overlying a lot as. The case was discussed with Dr. Arliss Journey who   has cared for the patient in the past; he did not wish to admit her nor   directly assume her care. The case was then discussed with Dr. Harlene Salts, the   patient's rheumatologist has asked that the patient be transferred to [facility] for care.  The case was also discussed with Dr. Corwin Levins who will see   the patient in the emergency department and arrange further care from that   point. The case was finally discussed with Dr. Merrilee Jansky in the emergency   department. The patient will be transported to [facility] ED by her   husband. She does not wish to be transported by ambulance. She has been   admonished to stay n.p.o. from this point forward.     The patient was given the following medication in the Emergency department:     Medication Administration from 04/03/2012 1723 to 04/03/2012 1934      Date/Time Order Dose Route Action Action by Comments   04/03/2012 1813   HYDROmorphone (DILAUDID) injection 1 mg 1 mg Intravenous Given Rebecca Jenkins   04/03/2012 1814 ondansetron (ZOFRAN) injection 4 mg 4 mg Intravenous   Given Rebecca Jenkins   04/03/2012 1814 sodium chloride 0.9% infusion 100   mL/hr Intravenous New Bag Rebecca Jenkins   04/03/2012 1934 sodium chloride   0.9% infusion 0 mL/hr Intravenous Stopped Rebecca  Jenkins infusion stopped on   transfer.   04/03/2012 1847 potassium chloride SA (K-DUR,KLOR-CON) CR tablet   40 mEq 40 mEq Oral Given Rebecca Jenkins         The nursing note was reviewed and appreciated.     Diagnostic Impression:     1. Olecranon bursitis 2. Cellulitis of elbow 3. Rheumatoid arthritis (HCC) 4.   Immunosuppression (HCC)         Electronically signed by: Marc A. Daneil Dolin, DO              Arlyss Repress., DO 04/03/12 1935     Patient was seen by myself here in emergency room. The patient is going to be   admitted to the hospitalist. The hand surgeon Dr. Corwin Levins has evaluated the   patient here in emergency room as well.     Acquanetta Chain., DO 04/03/12 2317         _________________________________  Signed by:    Acquanetta Chain    PR    D: 04/03/2012 11:17 PM  T: 04/03/2012 11:17 PM    This document is confidential medical information.  Unauthorized disclosure or   use of this information is prohibited by law.  If you are not the intended recipient of this document, please advise [place] by   calling immediately [phone].

## 2012-04-03 NOTE — Other (Unsigned)
ED PATIENT ENCOUNTER ARRIVAL: 04/03/12 1723             EMERGENCY DEPARTMENT ENCOUNTER NOTE     CHIEF COMPLAINT Chief Complaint Patient presents with   Abscess     HPI Rebecca Jenkins is a 47 year old female who presents with increasing   swelling and pain to the right elbow, in particular the olecranon bursa and   overlying skin. Patient states there's been malodorous pus draining from the   area since yesterday. This is become much worse over the last twenty-four   hours. The patient began having some shaking chills earlier this afternoon.   The pain is sharp, worsens with any movement of the arm and eases only   minimally with rest. She contacted her orthopedic surgeon's office who   instructed her to come to the emergency department for evaluation.     REVIEW OF SYSTEMS     Constitutional: See history of chief complaint Eyes:  Denies change in visual   acuity HENT:  Denies nasal congestion or sore throat Respiratory:  Denies   cough or shortness of breath Cardiovascular:  Denies chest pain or edema GI:    Denies abdominal pain, nausea, vomiting, bloody stools or diarrhea GU:  Denies   dysuria Musculoskeletal: See history of chief complaint Integument: See   history of chief complaint Neurologic:  Denies headache, focal weakness or   sensory changes Endocrine:  Denies polyuria or polydipsia Lymphatic:  Denies   swollen glands Psychiatric:  Denies depression or anxiety     PAST MEDICAL HISTORY Past Medical History Diagnosis Date   Rheumatoid   arthritis (HCC)   Unspecified essential hypertension     SURGICAL HISTORY Past Surgical History Procedure Laterality Date     Cholecystectomy   Cesarean section, classic   Back surgery   Elbow surgery     Hand surgery     CURRENT MEDICATIONS Prior to Admission medications Medication Sig Start Date   End Date Taking? Authorizing Provider ADDERALL XR, , CAP Take  by mouth 2   (two) times daily.   Yes Med, Historical lansoprazole (PREVACID) 30 MG CPDR   Take 30 mg by  mouth daily.   Yes Med, Historical clonazepam (KLONOPIN) 2 MG   TABS Take 4 mg by mouth nightly.   Yes Med, Historical predniSONE (DELTASONE)   5 MG TABS Take 1 tablet by mouth daily as needed. 02/07/12  Yes Koneru, [place]., MD Quetiapine Fumarate (SEROQUEL) 300 MG PO TABS 1 tab hs   Yes Med,   Historical Metformin HCl 850 MG PO TABS 2 daily   Yes Med, Historical   metoprolol tartrate (LOPRESSOR) 50 MG TABS TAKE ONE TABLET BY MOUTH TWICE A   DAY 04/02/12   Harlene Salts, Sander Nephew., MD leflunomide (ARAVA) 20 MG TABS TAKE ONE TABLET   BY MOUTH EVERY DAY 04/02/12 Ricki Miller., MD ranitidine (ZANTAC 75) 75 MG   TABS Take 1 tablet by mouth 2 (two) times daily. 03/01/12 04/03/12  Ricki Miller., MD RiTUXimab (RITUXAN IV) 1,000 mg by Intravenous route. 2 doses, 2 weeks   apart Indications: Rheumatoid Arthritis    Koneru, [place] L., MD ranitidine   (ZANTAC 75) 75 MG TABS Take 1 tablet by mouth 2 (two) times daily. 02/16/12   04/03/12  Ricki Miller., MD gabapentin (NEURONTIN) 100 MG CAPS TAKE ONE CAPSULE   BY MOUTH TWICE A DAY DURING THE DAY AND TAKE THREE CAPSULES BY MOUTH EVERY  NIGHT AT BEDTIME 01/16/12 04/03/12 Ricki MillerKoneru, Sri L., MD Omeprazole (PRILOSEC) 10   MG PO CPDR   04/03/12  Med, Historical Clonazepam 2 MG PO TABS 2-3 tablets daily    04/03/12  Med, Historical Amphetamine-Dextroamphetamine (ADDERALL) 20 MG PO   TABS 2 daily  04/03/12  Med, Historical Metoprolol Succinate 25 MG PO TB24 1-2   daily  04/03/12  Med, Historical Lubiprostone (AMITIZA) 24 MCG PO CAPS 1-2 daily    04/03/12  Med, Historical Triamterene-HCTZ 37.5-25 MG PO CAPS 1 daily  04/03/12    Med, Historical     ALLERGIES Allergies Allergen Reactions   Codeine     FAMILY HISTORY No family history on file.     SOCIAL HISTORY History     Social History   Marital Status: Married   Spouse Name: N/A   Number of   Children: N/A   Years of Education: N/A     Social History Main Topics   Smoking status: Current Every Day Smoker     Smokeless tobacco: Never Used   Alcohol Use: None   Drug  Use: None   Sexually   Active: None     Other Topics Concern   None     Social History Narrative   None         PHYSICAL EXAM VITAL SIGNS: BP 155/94   Pulse 120   Temp(Src) 98.4  F (36.9    C) (Oral)   Resp 19   Ht 64" (162.6 cm)   Wt 126 lb (57.153 kg)   BMI 21.62   kg/m2   SpO2 96% Constitutional: Thin female, moderate acute pain,   systemically ill. Head:  Normocephalic, atraumatic. Eyes:  PERRL, EOMI,   Conjunctiva normal, No discharge. ENT:   EACs normal, TMs normal. The nares   are clear bilaterally. No pharyngeal erythema or exudate. Neck: Normal range   of motion, No tenderness, no adenopathy. Respiratory: Breath sounds equal   bilaterally, few scattered rhonchi. Cardiovascular: Tachycardic, Normal   rhythm, peripheral pulses 2/4 and symmetric. GI:  Bowel sounds normal, Soft,   No tenderness, No masses, rebound, rigidity or guarding. Musculoskeletal:   Extremities intact x4. Marked chronic changes consistent with moderate to   severe rheumatoid arthritis. There is a large fluid collection in the right   olecranon bursa. Integument:  Warm, Dry, marked erythema over the area of the   right olecranon bursa. There are two lesions which appear to be ruptured   bullae. No active purulent drainage is noted. Neurologic:  Alert & oriented x   3, Normal motor function, Normal sensory function, No focal deficits noted.     EKG Sinus tachycardia, rate one nineteen, normal axes and intervals, no acute   ST-T segment changes. Read and reviewed by me.     LABORATORY Recent Results (from the past 12 hour(s)) BAMP   (NA,K,CL,CO2,GLU,BUN,CREAT,CA)  Collection Time  04/03/12  5:57 PM     Torrin Crihfield   Value Range  BLD UREA NITROGEN 8  8 - 26 mg/dL  SODIUM 161137  096135 - 045145 mEq/L    POTASSIUM 3.2 (*) 3.6 - 5.1 mEq/L  CHLORIDE 104  101 - 111 mEq/L  CO2 26  24 -   36 mEq/L  GLUCOSE, RANDOM 168 (*) 74 - 99 mg/dL  CREATININE 4.090.74  8.110.44 - 1.03   mg/dL  OSMOLALITY CALC 914276 (*) 280 - 300 mOSM/kg  CALCIUM 8.8  8.5 - 10.5   mg/dL  GFR NON-AFRICAN  AMER >60  >  60 mL/min/1.73 sq meter  GFR AFRICAN   AMERICAN >60  >60 mL/min/1.73 sq meter CBC WITH DIFFERENTIAL  Collection Time    04/03/12  5:57 PM     Monique Hefty Value Range  WBC 9.3  3.6 - 10.5 THOU/mcL  RBC   4.19  3.80 - 5.20 MIL/mcL  HEMOGLOBIN 12.7  12.0 - 15.2 g/dL  HEMATOCRIT 36    36 - 46 %  MCV 87  82 - 97 fL  MCH 30  27 - 33 pg  MCHC 35  32 - 36 g/dL  RDW   78.2  <95.6 %  PLATELET 247  140 - 375 THOU/mcL  SEGS 75  LYMPHOCYTES 12    MONOCYTES 12  EOSINOPHIL 1  BASOPHILS 0  ABS. NEUTROPHIL 6.90  1.80 - 7.70   THOU/mcL  ABS LYMPHS 1.10  1.00 - 4.00 THOU/mcL  ABS MONOS 1.10 (*) 0.20 -   0.90 THOU/mcL  ABS EOS 0.10  0.03 - 0.45 THOU/mcL  ABS BASOS 0.00 (*) 0.01 -   0.07 THOU/mcL PROTIME & INR  Collection Time  04/03/12  5:57 PM     Kasper Mudrick Value   Range  PROTIME 10.2  9.0 - 11.4 Seconds  INR 1.0  0.8 - 1.2 PTT  Collection   Time  04/03/12  5:57 PM     Vee Bahe Value Range  PTT 30.0  23.0 - 32.5 Seconds   SED RATE  Collection Time  04/03/12  5:57 PM     Ashe Gago Value Range  ESR 40 (*)   0 - 25 mm/hr LACTIC ACID  Collection Time  04/03/12  5:57 PM     Ahava Kissoon Value   Range  LACTIC ACID 1.8  0.5 - 2.2 mmol/L URINALYSIS, STAT  Collection Time    04/03/12  6:17 PM     Ely Spragg Value Range  UA SPECIMEN SOURCE URINE CLEAN CATCH    COLOR YELLOW  YELLOW  APPEARANCE, URINE CLEAR  CLEAR  PH, URINE 6.5  5.0 - 8.0    SPEC. GRAVITY, URINE LESS THAN OR EQUAL TO 1.005  1.005 - 1.029  PROTEIN,   URINE NEGATIVE  NEGATIVE  GLUCOSE, URINE TRACE (*) NEGATIVE  KETONE, URINE   NEGATIVE  NEGATIVE  BILIRUBIN, URINE NEGATIVE  NEGATIVE  BLOOD, URINE NEGATIVE    NEGATIVE  UROBILINOGEN, URINE 0.2  <2.0 mg/dL  NITRITE, URINE NEGATIVE    NEGATIVE  LEUKOCYTE ESTERASE, URINE NEGATIVE  NEGATIVE         RADIOLOGY XR CHEST AP PORTABLE  Final Aj Crunk[place]:      No acute findings.  14 mm nodular opacity RIGHT upper lobe could represent   pulmonary nodule.  Followup CT scan recommended.          no acute change on preliminary reading by me     ED COURSE & MEDICAL DECISION  MAKING Pertinent Labs & Imaging studies   reviewed. (See chart for details)     IV access was obtained. The patient was started on IV fluids. She was   medicated for pain and nausea. She appears to have an infected olecranon   bursitis with overlying a lot as. The case was discussed with Dr. Arliss Journey who   has cared for the patient in the past; he did not wish to admit her nor   directly assume her care. The case was then discussed with Dr. Harlene Salts, the   patient's rheumatologist has asked that the patient be transferred to [facility] for care.  The case was also discussed with Dr. Corwin Levins who will see   the patient in the emergency department and arrange further care from that   point. The case was finally discussed with Dr. Merrilee Jansky in the emergency   department. The patient will be transported [facility] ED by her   husband. She does not wish to be transported by ambulance. She has been   admonished to stay n.p.o. from this point forward.     The patient was given the following medication in the Emergency department:     Medication Administration from 04/03/2012 1723 to 04/03/2012 1934      Date/Time Order Dose Route Action Action by Comments   04/03/2012 1813   HYDROmorphone (DILAUDID) injection 1 mg 1 mg Intravenous Given Vevelyn Francois   04/03/2012 1814 ondansetron (ZOFRAN) injection 4 mg 4 mg Intravenous   Given Vevelyn Francois   04/03/2012 1814 sodium chloride 0.9% infusion 100   mL/hr Intravenous New Bag Vevelyn Francois   04/03/2012 1934 sodium chloride   0.9% infusion 0 mL/hr Intravenous Stopped David  Sens infusion stopped on   transfer.   04/03/2012 1847 potassium chloride SA (K-DUR,KLOR-CON) CR tablet   40 mEq 40 mEq Oral Given Michele Rockers         The nursing note was reviewed and appreciated.     Diagnostic Impression:     1. Olecranon bursitis 2. Cellulitis of elbow 3. Rheumatoid arthritis (HCC) 4.   Immunosuppression (HCC)         Electronically signed by: Marc A. Daneil Dolin, DO              Arlyss Repress., DO 04/03/12 1935         _________________________________  Signed by:    Liz Beach. TUEL    10    D: 04/03/2012 07:35 PM  T: 04/03/2012 07:35 PM    This document is confidential medical information.  Unauthorized disclosure or   use of this information is prohibited by law.  If you are not the intended recipient of this document, please advise [place] by   calling immediately [phone].

## 2012-04-03 NOTE — Telephone Encounter (Signed)
Spoke to Rebecca Jenkins, she stated her right elbow is very painful, swollen, red, discharge draining and foul odor.  She was requesting an appointment, referred her to the ER as Dr. Arliss Journey is in surgery.  She will call back for follow up appointment.

## 2012-04-03 NOTE — Other (Unsigned)
Orthopedic Hand Consult Note     Patient: Rebecca Jenkins MRN: [id number] Date of Birth: 30-Mar-1965    Age: 47 year old  Sex: female Unit: [facility] EMERGENCY DEPT Room/Bed: 20/20   Location: [place]     Admitting Physician: Primary Care Physician: Darrol Angel     Date of Consultation: 04/03/2012     Chief Complaint/HPI: I was asked to see Rebecca Jenkins A Covin by the treatment   team to evaluate the patient's current illness and to give my opinion   regarding appropriate treatment. KADEJAH SANDIFORD is a 47 year old year old   right handed female who presents with a chronic history of erythema, swelling,   and pain in the right elbow. The patient had surgery 2 years ago for elbow   synovectomy and ulnar nerve release/transposition. She presents with right   elbow pain, swelling. The patient also reports pain throughout her body, right   neck lymph node pain and fevers, chills.     PAST MEDICAL HISTORY:     PMH: Past Medical History Diagnosis Date   Rheumatoid arthritis (HCC)     Unspecified essential hypertension     PSH: Past Surgical History Procedure Laterality Date   Cholecystectomy     Cesarean section, classic   Back surgery   Elbow surgery   Hand surgery     ALLERGIES: Allergies Allergen Reactions   Codeine     CURRENT MEDICATIONS: Current Outpatient Rx Name  Route  Sig  Dispense  Refill     ADDERALL XR, , CAP   Oral   Take  by mouth 2 (two) times daily.           lansoprazole (PREVACID) 30 MG CPDR   Oral   Take 30 mg by mouth daily.           clonazepam (KLONOPIN) 2 MG TABS   Oral   Take 4 mg by mouth nightly.           predniSONE (DELTASONE) 5 MG TABS   Oral   Take 1 tablet by mouth daily as   needed.   30 tablet   0       Quetiapine Fumarate (SEROQUEL) 300 MG PO TABS   Oral   1 tab hs           Metformin HCl 850 MG PO TABS   Oral   2 daily           metoprolol tartrate (LOPRESSOR) 50 MG TABS       TAKE ONE TABLET BY MOUTH TWICE A DAY   60 tablet   0       leflunomide (ARAVA) 20 MG TABS        TAKE ONE TABLET BY MOUTH EVERY DAY   30 tablet   0       DISCONTD: ranitidine (ZANTAC 75) 75 MG TABS   Oral   Take 1 tablet by mouth   2 (two) times daily.   1 tablet   0       RiTUXimab (RITUXAN IV)   Intravenous   1,000 mg by Intravenous route. 2   doses, 2 weeks apart   Indications: Rheumatoid Arthritis           DISCONTD: ranitidine (ZANTAC 75) 75 MG TABS   Oral   Take 1 tablet by mouth   2 (two) times daily.   1 tablet   0       DISCONTD: gabapentin (NEURONTIN) 100  MG CAPS       TAKE ONE CAPSULE BY MOUTH TWICE A DAY DURING THE DAY AND TAKE THREE   CAPSULES BY MOUTH EVERY NIGHT AT BEDTIME   150 capsule   2       DISCONTD: Omeprazole (PRILOSEC) 10 MG PO CPDR               DISCONTD: Clonazepam 2 MG PO TABS   Oral   2-3 tablets daily           DISCONTD: Amphetamine-Dextroamphetamine (ADDERALL) 20 MG PO TABS   Oral   2   daily           DISCONTD: Metoprolol Succinate 25 MG PO TB24   Oral   1-2 daily           DISCONTD: Lubiprostone (AMITIZA) 24 MCG PO CAPS   Oral   1-2 daily           DISCONTD: Triamterene-HCTZ 37.5-25 MG PO CAPS   Oral   1 daily             FAMILY HISTORY: No family history on file.     SOCIAL HISTORY: History     Social History   Marital Status: Married   Spouse Name: N/A   Number of   Children: N/A   Years of Education: N/A     Social History Main Topics   Smoking status: Current Every Day Smoker     Smokeless tobacco: Never Used   Alcohol Use: None   Drug Use: None   Sexually   Active: None     Other Topics Concern   None     Social History Narrative   None     ROS: As mentioned per HPI, all other systems reviewed and otherwise negative.     PHYSICAL EXAMINATION:     Vital signs in last 24 hours: Temp:  [98.2  F (36.8  C)-98.5  F (36.9  C)]   98.5  F (36.9  C) Pulse:  [101-127] 107 Resp:  [16-19] 16 BP:   (153-172)/(93-116) 153/93 mmHg     Physical Exam: Focused exam of RUE: + swelling and erythema around right   elbow with large effusion Elbow ROM 40 deg in flex/ext arc with full    sup/pronation +tenderness to palpation over bursa NVI     + swelling of right MCP joints - pt had previous surgery with replacements         Scheduled Meds:       [COMPLETED] HYDROmorphone  1 mg Intravenous Once   [COMPLETED] ondansetron    4 mg Intravenous Once   [COMPLETED] potassium chloride SA  40 mEq Oral Once     HYDROmorphone  1 mg Intravenous Once   [COMPLETED] HYDROmorphone  1 mg   Intravenous Once   piperacillin-tazobactam  2.25 g Intravenous Q8H     vancomycin  1 g Intravenous Q12H     Assessment:     Septic right elbow olecranon bursitis     Plan:     At attempt in the ER to aspirate the bursitis was undertaken under sterile   conditions. Purulent-sangenous fluid was removed and sent for culture. Patient   will be admitted to hospital team, placed on IV antibiotics and set up for   operating room on Friday. Please make NPO after midnight on Thursday for OR on   Friday. Please call with any questions.     Thank you very much for asking  me to consult in the care of your patient   Rebecca LankJacqueline A Sturgeon.     Signed by: Phill MyronMeredith Osterman, MD         _________________________________  Signed by:    Phill MyronMEREDITH  OSTERMAN    10    D: 04/03/2012 10:46 PM  T: 04/03/2012 10:46 PM    This document is confidential medical information.  Unauthorized disclosure or   use of this information is prohibited by law.  If you are not the intended recipient of this document, please advise us by   calling immediately [phone].

## 2012-04-03 NOTE — Other (Unsigned)
______________________________________________________________________________  __________________________________________        Discharge Summary                                                         Makyia A Spray  04/03/2012  Admission      Admission Information  ______________________________________________________________________________  __________________________________________                                               Department    ______________________________________________________________________________  __________________________________________     04/03/2012                                  [id number]  ______________________________________________________________________________  __________________________________________    Allergies as of 04/08/2012  ______________________________________________________________________________  __________________________________________     Allergen                                        Reactions  ______________________________________________________________________________  __________________________________________     Codeine     Tape Adhesive                                   Rash  ______________________________________________________________________________  __________________________________________    Problem                                                                 Date   Reviewed: 04/04/2012  ______________________________________________________________________________  __________________________________________                                                          Codes       Priority         Class          Noted    ______________________________________________________________________________  __________________________________________     *Bursitis of elbow--(right)probable infection        726.33                                      04/04/2012     (Chronic)     Cellulitis and abscess of elbow--right (Chronic)     682.3                                        04/04/2012     Osteoporosis, unspecified  733.00                                      11/16/2009     Rheumatoid arthritis (HCC)                           714.0                                       10/26/2009  ______________________________________________________________________________  __________________________________________    [facility]ong [facility] Future Appointments  ______________________________________________________________________________  __________________________________________     Date & Time                   Provider                           Department                           Center  ______________________________________________________________________________  __________________________________________     04/18/2012 1:00 PM             Ricki Miller., MD                 [facility]   Rheumatology              [facility]  ______________________________________________________________________________  __________________________________________    [facility]  Medication List    ______________________________________________________________________________  __________________________________________  START taking these medications  ______________________________________________________________________________  __________________________________________                                                                Last dose             Next dose           Comments        ______________________________________________________________________________  __________________________________________       ondansetron 4 MG Tabs     Commonly known as: ZOFRAN     Take 1 tablet by mouth every 8 (eight) hours as needed.       oxycodone 5 MG Tabs     Commonly known as: ROXICODONE     Take 1 tablet by mouth every 4 (four) hours as needed.       polyethylene glycol Pack     Commonly known as: MIRALAX     Take 17 g by mouth  daily.  ______________________________________________________________________________  __________________________________________    Berneda Rose taking these medications  ______________________________________________________________________________  __________________________________________                                                                Last dose             Next dose           Comments  ______________________________________________________________________________  __________________________________________       ADDERALL XR (30MG ) CAP     Take  by mouth 2 (two) times daily.       clonazepam 2 MG Tabs     Commonly known as: KLONOPIN     Take 4 mg by mouth nightly.       gabapentin 100 MG Caps     Commonly known as: NEURONTIN     Take 300 mg by mouth at bedtime. Indications:     Fibromyalgia Syndrome       lansoprazole 30 MG Cpdr     Commonly known as: PREVACID     Take 30 mg by mouth daily.       leflunomide 20 MG Tabs     Commonly known as: ARAVA     TAKE ONE TABLET BY MOUTH EVERY DAY       metFORMIN 850 MG Tabs     Commonly known as: GLUCOPHAGE     2 daily       metoprolol tartrate 50 MG Tabs     Commonly known as: LOPRESSOR     TAKE ONE TABLET BY MOUTH TWICE A DAY       RITUXAN IV     1,000 mg by Intravenous route. 2 doses, 2 weeks apart     Indications: Rheumatoid Arthritis       SEROQUEL 300 MG Tabs     Generic drug: quetiapine     1 tab hs  ______________________________________________________________________________  __________________________________________    STOP taking these medications  ______________________________________________________________________________  __________________________________________       predniSONE 5 MG Tabs     Commonly known as:  DELTASONE  ______________________________________________________________________________  __________________________________________    ______________________________________________________________________________  __________________________________________      Where to Get Your Medications  ______________________________________________________________________________  __________________________________________       These are the prescriptions that you need to pick up.            You may get the following medications from any pharmacy        - oxycodone 5 MG Tabs        - polyethylene glycol Pack      ______________________________________________________________________________  __________________________________________       Information on where to get these meds is not yet available. Ask your nurse   or doctor.            - ondansetron 4 MG Tabs      ______________________________________________________________________________  __________________________________________    Post Discharge Orders  ______________________________________________________________________________  __________________________________________     No orders found for display  ______________________________________________________________________________  __________________________________________    Patient Instructions  ______________________________________________________________________________  __________________________________________      Family to advance packing out over the next 2-3 days, changing the dressing   twice a day (gauze roll and ace wrap).      ______________________________________________________________________________  __________________________________________  Patient Belongings  ______________________________________________________________________________  __________________________________________                                            Most Recent  Value    ______________________________________________________________________________  __________________________________________     Patient Belongings at Bedside     Belongings at Bedside                  None     Vision - Corrective Lenses  None     Jewelry                                Ring     Clothing                               Pants, Shirt, Footwear, Bra,   Underpants, Socks     Other Valuables                        Cell phone     Patient Belongings Sent Home/Family     Belongings Sent Home/Family            None     Valuables sent home with?              HERE WITH PATIENT     Patient Belongings Sent to Security     Belongings Sent to Security            None  ______________________________________________________________________________  __________________________________________      Additional Information    ______________________________________________________________________________  __________________________________________     IF YOU ARE A SMOKER OR HAVE SMOKED IN THE LAST 12 MONTHS, WE ENCOURAGE YOU   TO EXPLORE OPTIONS FOR QUITTING. Refer to     the literature given to you while in the hospital for advice on how to   quit.       For Pneumonia Patients: I understand that the pneumonia vaccine is   recommended for people 42 and older and people     with chronic health conditions, once in a lifetime. It should be repeated   every 5-10 years if received before age 56.     The flu vaccine should be given every year for people 78 and older, younger   for those with chronic health conditions.       For Heart Failure/Cardiac Patients: I understand:  - Regular activity   within my limitations is important for my     health and can consult my doctor for suggestions on exercise.  - Eating a   low fat, low cholesterol, & low sodium diet     with plenty of fruits and vegetables can reduce my chance of suffering a   future heart attack.  - Weighing myself     daily and reporting a gain of 2-3  pounds a day and/or 5-6 pounds a week to   my physician is important.  - If any of my     symptoms worsen, such as edema/swelling or difficulty breathing, contact my   doctor or go to the nearest emergency     department. - Be sure to keep all appointments with my doctor.       For Stroke Patients: I understand that by carefully controlling and   monitoring any of the risk factors listed, I can     decrease my risk of future stroke:  - High Blood Pressure (hypertension)     - High Blood Cholesterol (hyperlipidemia)     - Diabetes     - Smoking     - Alcohol Abuse     - Drug Abuse     If you are experiencing signs and symptoms of a stroke, which can include:     *     Slurred speech, confusion or trouble understanding     *  Severe headache     *     Sudden difficulty with vision     *     Sudden numbness or weakness of an extremity     *     Loss of balance or coordination     CALL 911 IMMEDIATELY      ______________________________________________________________________________  __________________________________________    ______________________________________________________________________________  __________________________________________           I have received the After Visit Summary (Patient Signature):     Date:  ______________________________________________________________________________  __________________________________________        This document has removed original colors, tables and or formatting to be   viewed in this system and is therefore  not the legal report and should not be relied on as the official report.  If   there are any questions, the legal  report in the originating system should be viewed and used as the   authoritative Riannon Mukherjee.

## 2012-06-27 MED ORDER — METFORMIN HCL 500 MG PO TABS
500 MG | ORAL_TABLET | ORAL | Status: DC
Start: 2012-06-27 — End: 2013-08-28

## 2012-07-01 NOTE — Other (Unsigned)
CURRENT STATUS IS EMERGENCY PENDING CARE MANAGEMENT DESIGNATION.     Any questions regarding MEDICAL RECORDS should be directed to Medical   Records: [facility] [phone] [id number] or [facility]   [phone]. Any questions regarding BILLING should be directed to the Care   Management Departments: [facility] [phone] or [facility] [phone].         _________________________________  Signed byJamison Neighbor:    [facility]  NOTIFY    ZZ    D: 07/01/2012 09:49 PM  T:    This document is confidential medical information.  Unauthorized disclosure or   use of this information is prohibited by law.  If you are not the intended recipient of this document, please advise us by   calling immediately [phone].

## 2012-07-02 NOTE — Other (Unsigned)
[facility] PATIENT ENCOUNTER ARRIVAL: 07/01/12 2149   CSN: [id number]     HAR: [id number]             EMERGENCY DEPARTMENT - General NOTE     CHIEF COMPLAINT Chief Complaint Patient presents with   General Medical     HPI Rebecca Jenkins is a 47 year old female who presents for evaluation of   right elbow pain. In January she had a bursitis of the right elbow with   cultures positive for strep. She had a PICC line in place and was seen by   infectious disease. Apparently the infection had resolved, and now she is   starting to have similar complaints. She has some pain to the right elbow, and   a little bit of drainage from where she previously had an incision and   drainage. She is starting to have pain up into her axilla, as well as   generalized body aches. She has had one week of diarrhea, no nausea or   vomiting. No black or bloody stools. No abdominal pain.     REVIEW OF SYSTEMS Constitutional:  Denies fever or chills Eyes:  Denies   change in visual acuity HENT:  Denies nasal congestion or sore throat   Respiratory:  Denies cough or shortness of breath Cardiovascular:  Denies   chest pain or edema GI:  Denies abdominal pain, nausea, vomiting, black   stools, bloody stools GU:  Denies dysuria Musculoskeletal:  Denies back pain   or joint pain Integument:  Denies rash Neurologic:  Denies headache, focal   weakness or sensory changes Endocrine:  Denies polyuria or polydipsia   Lymphatic:  Denies swollen glands Psychiatric:  Denies depression or anxiety     The rest of the review of systems is negative besides with stated in the   history of present illness     PAST MEDICAL HISTORY Past Medical History Diagnosis Date   Rheumatoid   arthritis   Unspecified essential hypertension   Depressive disorder, not   elsewhere classified   Diabetes mellitus   Stroke   tia   Osteoporosis,   unspecified   Esophageal reflux   Unspecified disorder of liver   hx of nash   and hep c per pt,d/t  methotrexate     SURGICAL HISTORY Past Surgical History Procedure Laterality Date     Cholecystectomy   Cesarean section, classic   Back surgery   Elbow surgery     Hand surgery   Ablation endometrial with resectoscope/electrocautery     Incision & drainage upper extremity Right 04/06/2012   Procedure: INCISION &   DRAINAGE RIGHT ELBOW;  Surgeon: Fara BorosMarkiewitz, Andrew D., MD;  Location: [facility]   OR;  Service: Orthopedics;  Laterality: Right;  I+D RIGHT ELBOW     CURRENT MEDICATIONS Prior to Admission medications Medication Sig Start Date   End Date Taking? Authorizing Provider gabapentin (NEURONTIN) 100 MG CAPS TAKE   ONE CAPSULE BY MOUTH TWICE A DAY DURING THE DAY AND TAKE THREE CAPSULES BY   MOUTH EVERY NIGHT AT BEDTIME 06/27/12  Yes Koneru, SpainSri L., MD metoprolol   tartrate (LOPRESSOR) 50 MG TABS TAKE ONE TABLET BY MOUTH TWICE A DAY 05/05/12    Yes Koneru, SpainSri L., MD leflunomide (ARAVA) 20 MG TABS TAKE ONE TABLET BY MOUTH   EVERY DAY 05/05/12  Yes Koneru, SpainSri L., MD ADDERALL XR, 30MG , CAP Take  by   mouth 2 (two) times daily.  Yes Med, Historical lansoprazole (PREVACID) 30 MG   CPDR Take 30 mg by mouth daily.   Yes Med, Historical clonazepam (KLONOPIN) 2   MG TABS Take 4 mg by mouth nightly.   Yes Med, Historical Quetiapine Fumarate   (SEROQUEL) 300 MG PO TABS 1 tab hs   Yes Med, Historical Metformin HCl 850 MG   PO TABS 2 daily   Yes Med, Historical penicillin V potassium (VEETID) 500 MG   TABS Take 1 tablet by mouth 4 (four) times daily. 04/23/12 07/01/12  Twana First   C., MD sodium chloride 0.9 % SOLN 100 mL with penicillin G potassium 5000000   UNITS SOLR 4 Million Units by Intravenous route every 4 (four) hours.  07/01/12    Med, Historical ondansetron (ZOFRAN) 4 MG TABS Take 1 tablet by mouth every 8   (eight) hours as needed. 04/08/12   Georganna Skeans, MD oxycodone   (ROXICODONE) 5 MG TABS Take 1 tablet by mouth every 4 (four) hours as needed.   04/08/12 07/01/12  Georganna Skeans, MD gabapentin (NEURONTIN) 100 MG  CAPS Take   300 mg by mouth at bedtime. Indications: Fibromyalgia Syndrome    Med,   Historical polyethylene glycol (MIRALAX) PACK Take 17 g by mouth daily.   04/05/12 07/01/12 Georganna Skeans, MD RiTUXimab (RITUXAN IV) 1,000 mg by   Intravenous route. 2 doses, 2 weeks apart Indications: Rheumatoid Arthritis      Koneru, Spain L., MD     ALLERGIES Allergies Allergen Reactions   Codeine Nausea   Can take 1/2 qd     Tape Adhesive Rash     FAMILY HISTORY No family history on file.     SOCIAL HISTORY History     Social History   Marital Status: Married   Spouse Name: N/A   Number of   Children: N/A   Years of Education: N/A     Social History Main Topics   Smoking status: Current Every Day Smoker -- 0.50   packs/day   Types: Cigarettes   Smokeless tobacco: Never Used   Alcohol Use:   No   Drug Use: No   Sexually Active: Not on file     Other Topics Concern   Not on file     Social History Narrative   No narrative on file         PHYSICAL EXAM VITAL SIGNS: BP 160/84   Pulse 74   Temp(Src) 97.9  F (36.6  C)   (Oral)   Resp 18   Ht 64" (162.6 cm)   Wt 127 lb (57.607 kg)   BMI 21.79   kg/m2   SpO2 100% Constitutional:  Well developed, Well nourished, mild to   moderate acute distress, Non-toxic appearance. HENT:  Normocephalic,   Atraumatic, Bilateral external ears normal, Oropharynx moist, No oral   exudates, Nose normal. Neck: Normal range of motion, No tenderness, Supple, No   stridor. Eyes:  PERRL, EOMI, Conjunctiva normal, No discharge. Respiratory:    Normal breath sounds, No respiratory distress, No wheezing, No chest   tenderness. Cardiovascular:  Normal heart rate, Normal rhythm, No murmurs, No   rubs, No gallops. GI:  Bowel sounds normal, Soft, No tenderness, No masses, No   pulsatile masses. Musculoskeletal:  Intact distal pulses, No edema, there is   a scar from a surgical incision on the right elbow which appears to have a   small amount of hypertrophic scar with minimal erythema and tenderness to   palpation, no  signs of any discharge, no warmth to the elbow, no signs of any   bursitis. No cyanosis, No clubbing. Good range of motion in all major joints.   No tenderness to palpation or major deformities noted. Back:- No tenderness,   no CVA tenderness Integument:  Warm, Dry, No erythema, No rash or lesions   Neurologic:  Alert & oriented x 3, CN 2-12 GI, Normal motor function, Normal   sensory function, No focal deficits noted. Psychiatric:  Affect normal,   Judgment normal, Mood normal.     EKG         LABORATORY Results for orders placed during the hospital encounter of   07/01/12 (from the past 24 hour(s)) CBC WITH DIFFERENTIAL     Rebecca Jenkins Value   Range  WBC 6.6  3.6 - 10.5 THOU/mcL  RBC 4.56  3.80 - 5.20 MIL/mcL  HEMOGLOBIN   12.8  12.0 - 15.2 g/dL  HEMATOCRIT 38  36 - 46 %  MCV 84  82 - 97 fL  MCH 28    27 - 33 pg  MCHC 33  32 - 36 g/dL  RDW 16.1  <09.6 %  PLATELET 324  140 - 375   THOU/mcL  ABS. NEUTROPHIL 3.69  1.80 - 7.70 THOU/mcL  ABS LYMPHS 2.08  1.00 -   4.00 THOU/mcL  ABS MONOS 0.57  0.20 - 0.90 THOU/mcL  ABS EOS 0.23  0.03 -   0.45 THOU/mcL  ABS BASOS 0.04  0.01 - 0.07 THOU/mcL  SEGS 55  LYMPHOCYTES 31    MONOCYTES 9  EOSINOPHIL 4  BASOPHILS 1 POCT METABOLIC PANEL - ISTAT     Rebecca Jenkins   Value Range  GLUCOSE 85  74 - 99 mg/dL  BLD UREA NITROGEN 5 (*) 8 - 26 mg/dL    CREATININE 0.9  0.5 - 1.2 mg/dL  SODIUM 045  409 - 811 mEq/L  POTASSIUM 3.5   (*) 3.6 - 5.1 mEq/L  CHLORIDE 107  101 - 111 mEq/L  CO2 TOTAL 27  24 - 36   mEq/L  IONIZED CALCIUM 4.5 (*) 4.6 - 5.4 mg/dL  ANION GAP 14  6 - 18    HEMOGLOBIN,CALC 12.9  12.0 - 15.0 g/dL  HEMATOCRIT 38  36 - 47 %PCV     RADIOLOGY     The patient was given the following medication in the Emergency department:     Medication Administration from 07/01/2012 2149 to 07/02/2012 0053      Date/Time Order Dose Route Action Action by Comments   07/02/2012 0011   amoxicillin-clavulanate (AUGMENTIN) 875-125 MG per tablet 1 tablet 1 tablet   Oral Given Rebecca Jenkins         PROCEDURES  None     ED COURSE & MEDICAL DECISION MAKING Pertinent Labs & Imaging studies   reviewed. (See chart for details) Patient presents with multiple complaints.   Considering her diarrhea could do believe that she has gastroenteritis, as   there is a significant amount of this in the community at this time. She was   informed that this is likely viral in nature, and antibiotics will not help.   She is not showing any signs of dehydration, so I will not give her any IV   fluids. Concerning the elbow I do not see a definite bursitis at this time,   though there is a small area of hypertrophic scar which has become slightly   painful and tender. I do believe this could possibly  be an early cellulitis. I   spoke with the on-call infectious disease physician, who agrees that the   patient can be discharged home. We will start her on Augmentin and she will   followup with her own infectious disease doctor, Dr. Olena Leatherwood. She was given a   prescription for Zofran as well to help her with the nausea that she could   experience from the Augmentin.     The patient now feels better.     Patient was given the signs and symptoms of worsening condition and told to   return if they occurred.  Patient understands the plan and management and all   questions were answered.  Patient will follow up with her primary care   physician in the next 2-3 days.  Patient was instructed on Culture results and   preliminary radiology reads and that she would be contacted if a variance or   positive test is obtained.  The nursing note was reviewed, agreed and   appreciated.     Final Diagnosis:     1. Cell phone elbow, right     The patient was given the following medications to go home with.     New Prescriptions  AMOXICILLIN-CLAVULANATE (AUGMENTIN) 875-125 MG TABS      Take 1 tablet by mouth 2 (two) times daily for 10 days.  ONDANSETRON   (ZOFRAN-ODT) 4 MG TBDP    Take 1 tablet by mouth every 8 (eight) hours as   needed.     Follow-up Information  Follow  up With Details Comments Contact Info  Margarito Courser., MD Call today  [address] [place] [address]   [phone]             Electronically signed by Artist Pais. Dan Humphreys, MD         Lucinda Dell., MD 07/02/12 [phone]         _________________________________  Signed by:  Lucinda Dell  Rebecca Jenkins    10    D: 07/02/2012 12:53 AM  T: 07/02/2012 12:53 AM    This document is confidential medical information.  Unauthorized disclosure or   use of this information is prohibited by law.  If you are not the intended recipient of this document, please advise [place] by   calling immediately [phone].

## 2012-10-28 NOTE — Telephone Encounter (Signed)
Dr. Marcille Blanco Referral  Fax [phone]  Phone [phone]  No appt scheduled  Rheumatology - "Pt has rheumatoid arthritis"

## 2012-10-30 NOTE — Telephone Encounter (Signed)
faxed

## 2012-10-31 NOTE — Unmapped (Signed)
Left patient husband a VM to call the office back to be scheduled.

## 2012-10-31 NOTE — Unmapped (Addendum)
Left voice message.  Pt needs a New Pt appt with the Fellow in Rheumatology for rheumatoid arthritis.  She already has an appt at the [facility] on 11/12/12 and the Fellow is also here that day if she would like to book then.  Referral in Epic.

## 2012-10-31 NOTE — Unmapped (Signed)
Patient's husband called to find out if a referral was received so he can schedule an appointment. Please advise [phone]

## 2012-11-01 NOTE — Telephone Encounter (Signed)
Called to inform patient she is due for mammogram

## 2012-11-04 MED ORDER — LANSOPRAZOLE 30 MG PO CPDR
30 MG | ORAL_CAPSULE | ORAL | Status: DC
Start: 2012-11-04 — End: 2013-06-30

## 2012-11-05 NOTE — Telephone Encounter (Signed)
Left another message for patient.

## 2012-11-12 ENCOUNTER — Encounter: Payer: PRIVATE HEALTH INSURANCE | Attending: Gastroenterology

## 2012-12-04 NOTE — Unmapped (Signed)
Left message for pt to call the office to schedule appt with Rheumatology.

## 2012-12-12 NOTE — Unmapped (Signed)
Left message regarding notes of 10/31/12 and 12/04/12.

## 2012-12-22 ENCOUNTER — Inpatient Hospital Stay
Admit: 2012-12-22 | Discharge: 2012-12-23 | Disposition: A | Discharge status: Home / Self Care | Payer: PRIVATE HEALTH INSURANCE

## 2012-12-22 DIAGNOSIS — S42209A Unspecified fracture of upper end of unspecified humerus, initial encounter for closed fracture: Secondary | ICD-10-CM

## 2012-12-22 MED ORDER — prochlorperazine (COMPAZINE) 10 MG tablet
10 | ORAL_TABLET | Freq: Four times a day (QID) | ORAL | 1.00 refills | 8.00000 days | Status: AC | PRN
Start: 2012-12-22 — End: ?
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
## 2012-12-22 NOTE — Unmapped (Signed)
VSS and as documented. Discharge instructions, including follow up and medications reviewed and they verbalized understanding. Pt discharged home in stable condition

## 2012-12-22 NOTE — Unmapped (Signed)
MD Leenellett at bedside
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
## 2012-12-23 ENCOUNTER — Inpatient Hospital Stay: Admit: 2012-12-23 | Payer: PRIVATE HEALTH INSURANCE

## 2012-12-23 ENCOUNTER — Ambulatory Visit: Admit: 2012-12-23 | Discharge: 2012-12-23 | Payer: PRIVATE HEALTH INSURANCE | Attending: Sports Medicine

## 2012-12-23 DIAGNOSIS — M25512 Pain in left shoulder: Secondary | ICD-10-CM

## 2012-12-23 DIAGNOSIS — S42253A Displaced fracture of greater tuberosity of unspecified humerus, initial encounter for closed fracture: Secondary | ICD-10-CM

## 2012-12-23 NOTE — Unmapped (Signed)
This office note has been dictated.

## 2012-12-30 NOTE — Unmapped (Signed)
Patient called and wanted to cancel her surgery with Dr. Lynnda Shields for tomorrow, 10/7 she would not like to reschedule at this time. Cancelled case and notified Dr. Lynnda Shields.
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
## 2013-01-09 MED ORDER — METOPROLOL SUCCINATE ER 25 MG PO TB24
25 MG | ORAL_TABLET | Freq: Every day | ORAL | Status: DC
Start: 2013-01-09 — End: 2013-03-10

## 2013-01-20 ENCOUNTER — Encounter

## 2013-03-10 MED ORDER — METOPROLOL SUCCINATE ER 25 MG PO TB24
25 MG | ORAL_TABLET | Freq: Every day | ORAL | Status: DC
Start: 2013-03-10 — End: 2013-04-12

## 2013-03-17 MED ORDER — METOPROLOL SUCCINATE ER 25 MG PO TB24
25 MG | ORAL_TABLET | Freq: Every day | ORAL | Status: DC
Start: 2013-03-17 — End: 2013-06-23

## 2013-04-12 MED ORDER — METOPROLOL SUCCINATE ER 25 MG PO TB24
25 MG | ORAL_TABLET | ORAL | Status: DC
Start: 2013-04-12 — End: 2013-11-04

## 2013-05-27 NOTE — Telephone Encounter (Signed)
No, she has been told over and over after getting numerous 30 day supplies that she needs to make a routine check up appointment. No meds until she comes in.

## 2013-05-27 NOTE — Telephone Encounter (Signed)
Ok for this?

## 2013-06-23 MED ORDER — METOPROLOL SUCCINATE ER 25 MG PO TB24
25 MG | ORAL_TABLET | Freq: Every day | ORAL | Status: DC
Start: 2013-06-23 — End: 2013-06-30

## 2013-06-30 MED ORDER — CEPHALEXIN 500 MG PO CAPS
500 MG | ORAL_CAPSULE | Freq: Four times a day (QID) | ORAL | Status: DC
Start: 2013-06-30 — End: 2013-11-04

## 2013-06-30 NOTE — Patient Instructions (Signed)
Take advil or aleve for the pain   call if cough doesnot clear   try to quit smoking
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
## 2013-07-23 MED ORDER — METOPROLOL SUCCINATE ER 25 MG PO TB24
25 MG | ORAL_TABLET | Freq: Every day | ORAL | Status: DC
Start: 2013-07-23 — End: 2013-10-21

## 2013-08-29 MED ORDER — METFORMIN HCL 500 MG PO TABS
500 MG | ORAL_TABLET | ORAL | Status: DC
Start: 2013-08-29 — End: 2013-09-29

## 2013-09-29 MED ORDER — METFORMIN HCL 500 MG PO TABS
500 MG | ORAL_TABLET | ORAL | Status: DC
Start: 2013-09-29 — End: 2013-10-27

## 2013-09-29 NOTE — Telephone Encounter (Signed)
Pt advised to make an appt.

## 2013-09-30 NOTE — Telephone Encounter (Signed)
LMOM for pt to schedule appt     Pt needs rtc/ htn-meds with Forde RadonAJ

## 2013-10-21 MED ORDER — METOPROLOL SUCCINATE ER 25 MG PO TB24
25 MG | ORAL_TABLET | ORAL | Status: DC
Start: 2013-10-21 — End: 2013-11-04

## 2013-10-21 NOTE — Telephone Encounter (Signed)
Pt must make an appt before anymore refills.  Registrar called her.

## 2013-10-22 NOTE — Telephone Encounter (Signed)
LMOM for pt to cal and make rtc appt - advised no refills will be  Approved until seen - last rtc visit was Sept of 2013

## 2013-10-27 MED ORDER — METFORMIN HCL 500 MG PO TABS
500 MG | ORAL_TABLET | ORAL | Status: DC
Start: 2013-10-27 — End: 2013-11-23

## 2013-11-04 MED ORDER — DULOXETINE HCL 30 MG PO CPEP
30 MG | ORAL_CAPSULE | ORAL | Status: DC
Start: 2013-11-04 — End: 2013-12-26

## 2013-11-04 MED ORDER — METOPROLOL TARTRATE 25 MG PO TABS
25 MG | ORAL_TABLET | Freq: Two times a day (BID) | ORAL | Status: DC
Start: 2013-11-04 — End: 2013-12-26

## 2013-11-04 NOTE — Progress Notes (Signed)
I have reviewed the history and physical note and findings.

## 2013-11-04 NOTE — Patient Instructions (Addendum)
Self- Management Goals for the Patient with Hypertension:    It is important to have goals to work towards when you have Hypertension.    Below is a list of goals your doctor would like you to work towards to help control your hypertension and also maintain and improve your overall health.    Please select one of these goals to try before your next follow up visit:    Goal: I will exercise for 30 minutes, 3-5 days per week.  I will follow the diet recommendations provided by my doctor: low fat, low cholesterol, substitute healthy fats, such as olive oil, canola oil, grapeseed oil for saturated fats like butter, margarine, and shortening, minimize processed foods and reduce sodium intake.  I will take all medications as prescribed by my doctor, and I will call the office if I am having any medication problems.    Guess your barriers before they happen. Everyone runs into barriers to their goals. You may already know what's going to get in your way. Write down these problems (cost? time? stress? fear?), and think of ways to get around them.     Barriers to success: lack of motivation  Plan for overcoming my barriers: N/A     Confidence: 8/10  Date goal set: 11/04/13  Date goal attained:    Get routine labs, mammogram, get pap smear, try cymbalta for anxiety and see how it works, return in 6 months.     Rebecca Jenkins was seen today for follow-up.    Diagnoses and associated orders for this visit:    Other chronic nonalcoholic liver disease    Tobacco use disorder    Rheumatoid arthritis    Unspecified essential hypertension  - metoprolol (LOPRESSOR) 25 MG tablet; Take 1 tablet by mouth 2 times daily    Gastroesophageal reflux disease without esophagitis    Anxiety  - DULoxetine (CYMBALTA) 30 MG capsule; Take 30mg  daily x 1 week, then 60mg  daily thereafter

## 2013-11-04 NOTE — Progress Notes (Signed)
Subjective:      Patient ID: Rebecca Jenkins is a 48 y.o. female.    HPI Patient is here for a follow-up on her medications. She is having issues with her husband. She thinks he cheated on her but he is denying it. She is going to counseling starting tomorrow. She had found some text messages on the phone bill.     She is not taking Linzess anymore because it works too much.   She has a good appetite, eats healthy. She walks 2 miles daily.       Sodium (mmol/L)   Date Value   06/14/2011 141     BUN (mg/dL)   Date Value   7/82/9562 9     Glucose (mg/dL)   Date Value   04/26/8655 96       Potassium (mmol/L)   Date Value   06/14/2011 3.7     CREATININE (mg/dL)   Date Value   8/46/9629 0.78            BP Readings from Last 2 Encounters:   11/04/13 122/86   06/30/13 140/84       Is patient currently taking any antihypertensive medications?     Yes   If yes, see med list as above    Is the patient reporting any side effects of antihypertensive medications?   No    Is the patient taking any over the counter medications?    No   If yes, see med list as above    Is the patient taking a daily aspirin?    No            Review of Systems   Constitutional: Negative for fatigue and unexpected weight change.   HENT: Negative for nosebleeds.    Eyes: Negative for visual disturbance.   Respiratory: Negative for cough and shortness of breath.    Cardiovascular: Negative for chest pain, palpitations and leg swelling.   Gastrointestinal: Negative for abdominal pain.   Musculoskeletal: Negative for back pain.   Neurological: Negative for dizziness and headaches.       Objective:   Physical Exam   Constitutional: She is oriented to person, place, and time. Vital signs are normal. She appears well-developed and well-nourished. She is cooperative.   HENT:   Head: Normocephalic.   Right Ear: Tympanic membrane and ear canal normal.   Left Ear: Tympanic membrane and ear canal normal.   Nose: Nose normal.   Mouth/Throat: Uvula is midline,  oropharynx is clear and moist and mucous membranes are normal.   Eyes: Conjunctivae are normal. Pupils are equal, round, and reactive to light.   Neck: Neck supple. No thyromegaly present.   Cardiovascular: Normal rate, regular rhythm and normal heart sounds.    No murmur heard.  Pulses:       Dorsalis pedis pulses are 2+ on the right side, and 2+ on the left side.   Pulmonary/Chest: Effort normal and breath sounds normal. No respiratory distress. She has no decreased breath sounds.   Abdominal: Soft. Normal appearance and bowel sounds are normal. She exhibits no distension and no mass. There is no hepatosplenomegaly. There is no tenderness. There is no rigidity, no rebound, no guarding and no CVA tenderness.   Musculoskeletal: Normal range of motion. She exhibits no edema.        Right foot: Normal.   Hand deformity from RA   Lymphadenopathy:     She has no cervical adenopathy.   Neurological: She  is alert and oriented to person, place, and time. She has normal reflexes.   Skin: Skin is warm, dry and intact. No rash noted.   Psychiatric: Her speech is normal and behavior is normal. Thought content normal. Her mood appears anxious. She does not exhibit a depressed mood.       Assessment:      Rebecca Jenkins was seen today for follow-up.    Diagnoses and associated orders for this visit:    Other chronic nonalcoholic liver disease  -stable    Tobacco use disorder  -not ready to quit    Rheumatoid arthritis  -stable, seeing rheumtologist    Unspecified essential hypertension  - metoprolol (LOPRESSOR) 25 MG tablet; Take 1 tablet by mouth 2 times daily  -stable, continue meds    Gastroesophageal reflux disease without esophagitis  -stable, continue meds    Anxiety  - DULoxetine (CYMBALTA) 30 MG capsule; Take 30mg  daily x 1 week, then 60mg  daily thereafter               Plan:      Rebecca Jenkins received counseling on the following healthy behaviors: nutrition, exercise, medication adherence and tobacco cessation    Patient  given educational materials on Smoking Cessation, Nutrition, Exercise and Hypertension    I have instructed Rebecca Jenkins to complete a self tracking handout on Blood Pressures  and Smoking and instructed them to bring it with them to her next appointment.     Discussed use, benefit, and side effects of prescribed medications.  Barriers to medication compliance addressed.  All patient questions answered.  Pt voiced understanding.     Get labs, mammogram and pap smear, return in 6 months.

## 2013-11-18 MED ORDER — METOPROLOL SUCCINATE ER 25 MG PO TB24
25 MG | ORAL_TABLET | ORAL | Status: DC
Start: 2013-11-18 — End: 2014-07-15

## 2013-11-25 MED ORDER — METFORMIN HCL 500 MG PO TABS
500 MG | ORAL_TABLET | ORAL | Status: DC
Start: 2013-11-25 — End: 2014-11-27

## 2013-12-03 MED ORDER — DULOXETINE HCL 60 MG PO CPEP
60 MG | ORAL_CAPSULE | Freq: Every day | ORAL | Status: DC
Start: 2013-12-03 — End: 2014-03-30

## 2013-12-10 NOTE — Telephone Encounter (Signed)
Rebecca Jenkins is calling requesting a lab order  Pt is there now for an appt  Pt left her order given by Forde RadonAJ at home    Can AJ inform them on what pt needs?    Please advise

## 2013-12-10 NOTE — Telephone Encounter (Signed)
Done

## 2013-12-10 NOTE — Telephone Encounter (Signed)
Gave - CMP, CBC with diff, TSH, Hga1c

## 2013-12-11 MED ORDER — LEVOTHYROXINE SODIUM 25 MCG PO TABS
25 MCG | ORAL_TABLET | Freq: Every day | ORAL | Status: DC
Start: 2013-12-11 — End: 2014-04-10

## 2013-12-11 NOTE — Telephone Encounter (Signed)
Patient is informed that she has hypothyroidism and she complains of having diarrhea since Sunday.  Patient states that she started taking anti-diarrhea pills Monday and the diarrhea subsided on Wednesday so she stop taking the pills.  Then today it started back again.  Patient also states that she has been retaining water for about a week.  Patient wants to know what she can do for diarrhea and about her retaining water?  Please advise

## 2013-12-11 NOTE — Telephone Encounter (Signed)
lmtc- Erica has labs

## 2013-12-12 NOTE — Telephone Encounter (Signed)
Decrease sodium intake and keep legs elevated. Diarrhea she needs to have a clear liquid diet until it starts to be formed again, then introduce bland food.

## 2013-12-12 NOTE — Telephone Encounter (Signed)
lmtc

## 2013-12-15 NOTE — Telephone Encounter (Signed)
Have tried to call a few times.  Mailbox is full.

## 2013-12-15 NOTE — Telephone Encounter (Signed)
Mail box full

## 2013-12-26 ENCOUNTER — Ambulatory Visit: Admit: 2013-12-26 | Discharge: 2013-12-26 | Payer: BLUE CROSS/BLUE SHIELD | Attending: Physician Assistant

## 2013-12-26 DIAGNOSIS — E039 Hypothyroidism, unspecified: Secondary | ICD-10-CM

## 2013-12-26 MED ORDER — SUMATRIPTAN SUCCINATE 100 MG PO TABS
100 MG | ORAL_TABLET | Freq: Once | ORAL | Status: DC | PRN
Start: 2013-12-26 — End: 2014-07-15

## 2013-12-26 NOTE — Progress Notes (Signed)
Subjective:      Patient ID: Rebecca Jenkins is a 48 y.o. female.    HPI  She recently had labs done and her TSH was elevated and she started synthroid 25mcg 3 weeks ago. She is exhausted doing anything around the house. She is tired all the time. Her hair is falling out and, moody and gaining weight. She has had severe HA's. She used to have migraines but hasn't had one in a long time. These feel the same but more mild. She is very unhappy that she is gaining.     Review of Systems   Constitutional: Positive for fatigue and unexpected weight change.   HENT: Negative for nosebleeds and trouble swallowing.    Eyes: Negative for visual disturbance.   Respiratory: Negative for cough and shortness of breath.    Cardiovascular: Negative for chest pain, palpitations and leg swelling.   Gastrointestinal: Negative for nausea, vomiting, abdominal pain, diarrhea and constipation.   Endocrine:        Hair loss   Musculoskeletal: Negative for back pain.   Skin: Negative for rash.   Neurological: Positive for headaches. Negative for dizziness.   Psychiatric/Behavioral: Negative for sleep disturbance. The patient is nervous/anxious (moody).        Objective:   Physical Exam   Constitutional: She is oriented to person, place, and time. Vital signs are normal. She appears well-developed and well-nourished. She is cooperative.   Neurological: She is alert and oriented to person, place, and time.   Skin: Skin is warm, dry and intact. No rash noted.   Psychiatric: Her speech is normal and behavior is normal. Thought content normal. Her mood appears anxious. She does not exhibit a depressed mood.       Assessment:      Rebecca Jenkins was seen today for follow-up.    Diagnoses and associated orders for this visit:    Hypothyroidism, unspecified hypothyroidism type    Osteoporosis    Other migraine without status migrainosus, not intractable  - SUMAtriptan (IMITREX) 100 MG tablet; Take 1 tablet by mouth once as needed for Migraine                Plan:      Get labs in 3-4 weeks and try imitrex for migraines.

## 2013-12-26 NOTE — Patient Instructions (Signed)
Rebecca Jenkins was seen today for follow-up.    Diagnoses and associated orders for this visit:    Hypothyroidism, unspecified hypothyroidism type    Osteoporosis    Other migraine without status migrainosus, not intractable  - SUMAtriptan (IMITREX) 100 MG tablet; Take 1 tablet by mouth once as needed for Migraine         Get labs in 3-4 weeks, take imitrex if needed for a migraine.

## 2014-01-06 ENCOUNTER — Encounter

## 2014-01-06 LAB — T4, FREE: T4 Free: 1.1 ng/dL (ref 0.9–1.8)

## 2014-01-06 LAB — T3: T3, Total: 0.87 ng/mL (ref 0.80–2.00)

## 2014-01-06 LAB — VITAMIN D 25 HYDROXY: Vit D, 25-Hydroxy: 29 ng/mL — ABNORMAL LOW (ref >=30)

## 2014-01-06 LAB — TSH: TSH: 1.37 u[IU]/mL (ref 0.27–4.20)

## 2014-01-06 LAB — T4: T4, Total: 6.1 ug/dL (ref 4.5–10.9)

## 2014-01-06 NOTE — Telephone Encounter (Signed)
LM for pt to call back

## 2014-01-06 NOTE — Telephone Encounter (Signed)
She should not be having migraines everyday, nor taking the imitrex daily, refer to neurology. All other migraine meds are very expensive.

## 2014-01-06 NOTE — Telephone Encounter (Signed)
?

## 2014-01-06 NOTE — Telephone Encounter (Signed)
Pt's spouse states the SUMAtriptan (IMITREX) 100 MG tablet for Migraines will only be covered for 12 a month by the insurance.  And they cannot be filled for 3 to make up the 12.      Is there something else that can be prescribed as she continues to have the migraines every day or something else that can be done?    Pt is having her BW completed today at [facility]    Please call and advise

## 2014-01-07 NOTE — Telephone Encounter (Signed)
Other LM is in result notes

## 2014-01-07 NOTE — Progress Notes (Signed)
[facility] [address] [place], [place] [id number]    Acknowledgment of Informed Consent for Surgical or Medical Procedure and Sedation  I agree to allow doctor(s) Raphael GibneyKuy, Daniel G and his/her associates or assistants, including residents and/or other qualified medical practitioner to perform the following medical treatment or procedure and to administer or direct the administration of sedation as necessary:  Procedure(s) : FULL ABDOMINOPLASTY, LIPOSUCTION TO FLANKS, FAT TRANSFER TO CHEEKS AND LIPS; IMPLANT EXCHANGE, FULL MASTOPEXY  My doctor has explained the following regarding the proposed procedure:  ??? the explanation of the procedure  ??? the benefits of the procedure  ??? the potential problems that might occur during recuperation  ??? the risks and side effects of the procedure which could include but are not limited to severe blood loss, infection, stroke or death  ??? the benefits, risks and side effect of alternative procedures including the consequences of declining this procedure or any alternative procedures  ??? the likelihood of achieving satisfactory results.  I acknowledge no guarantee or assurance has been made to me regarding the results.    I understand that during the course of this treatment/procedure, unforeseen conditions can occur which require an additional or different procedure.  I agree to allow my physician or assistants to perform such extension of the original procedure as they may find necessary.    I understand that sedation will often result in temporary impairment of memory and fine motor skills and that sedation can occasionally progress to a state of deep sedation or general anesthesia.    I understand the risks of anesthesia for surgery include, but are not limited to, sore throat, hoarseness, injury to face, mouth, or teeth; nausea; headache; injury to blood vessels or nerves; death, brain damage, or paralysis.    I understand that if I have a Limitation of  Treatment order in effect during my hospitalization, the order may or may not be in effect during this procedure.     I give my doctor permission to give me blood or blood products.  I understand that there are risks with receiving blood such as hepatitis, AIDS, fever, or allergic reaction.  I acknowledge that the risks, benefits, and alternatives of this treatment have been explained to me and that no express or implied warranty has been given by the hospital, any blood bank, or any person or entity as to the blood or blood components transfused.    At the discretion of my doctor, I agree to allow observers, equipment/product representatives and allow photographing, and/or televising of the procedure, provided my name or identity is maintained confidentially.      I agree the hospital may dispose of or use for scientific or educational purposes any tissue, fluid, or body parts which may be removed.    ________________________________Date________Time______ am/pm  (Circle One)  Patient or Signature of Closest Relative or Legal Guardian    ________________________________Date________Time______am/pm      Page 1 of ____  Witness

## 2014-01-08 NOTE — Telephone Encounter (Signed)
Pt advised of message below.  She is getting ready to have surgery, she will get a hold of a neurologist after surgery and healing.

## 2014-01-08 NOTE — Telephone Encounter (Signed)
LM again

## 2014-01-13 MED ORDER — CHOLECALCIFEROL 50 MCG (2000 UT) PO TABS
50 MCG (2000 UT) | ORAL_TABLET | Freq: Every day | ORAL | Status: DC
Start: 2014-01-13 — End: 2015-02-01

## 2014-01-13 NOTE — Progress Notes (Signed)
Preoperative Consultation    Rebecca Jenkins  Date of Birth:  Apr 15, 1965    This patient presents to the office today for a preoperative consultation at the request of surgeon, Dr. Rosana Fretaniel Kuy, who plans on performing tummy tuck, lipo suction, and [facility] lift & [facility] [address]      Planned anesthesia: General   Known anesthesia problems: Awoke during a surgical procedure in the past, details not available  Bleeding risk: No recent or remote history of abnormal bleeding  Personal or FH of DVT/PE: No      Patient Active Problem List   Diagnosis   ??? Other chronic nonalcoholic liver disease   ??? Tobacco use disorder   ??? Rheumatoid arthritis (HCC)   ??? Essential hypertension   ??? Esophageal reflux   ??? Anxiety   ??? Hypothyroidism   ??? Osteoporosis     Past Surgical History   Procedure Laterality Date   ??? Cesarean section       X3   ??? Cholecystectomy  05/03   ??? Liver biopsy       NON ALCOHOLIC CIRRHOSIS   ??? Tubal ligation     ??? Breast enhancement surgery  2001   ??? Back surgery       L1-2   ??? Knee surgery       left   ??? Elbow surgery       left ulnar nerve       Allergies   Allergen Reactions   ??? Codeine    ??? Other      ORAL PAIN MED ? NAME   ??? Hydrocodone Nausea Only   ??? Oxycodone Nausea Only     Outpatient Prescriptions Marked as Taking for the 01/13/14 encounter (Office Visit) with Lum Keaseresa M Slough O'Brien, MD   Medication Sig Dispense Refill   ??? levothyroxine (LEVOTHROID) 25 MCG tablet Take 1 tablet by mouth daily 30 tablet 3   ??? DULoxetine (CYMBALTA) 60 MG capsule Take 1 capsule by mouth daily 30 capsule 3   ??? metFORMIN (GLUCOPHAGE) 500 MG tablet TAKE ONE TABLET BY MOUTH TWICE A DAY WITH MEALS 60 tablet 11   ??? metoprolol (TOPROL-XL) 25 MG XL tablet TAKE ONE TABLET BY MOUTH DAILY (Patient taking differently: take one tablet twice daily) 30 tablet 1   ??? amphetamine-dextroamphetamine (ADDERALL) 30 MG tablet Take 30 mg by mouth 2 times daily.     ??? clonazePAM (KLONOPIN) 1 MG tablet  Take 1 mg by mouth nightly as needed for Anxiety.     ??? omeprazole (PRILOSEC OTC) 20 MG tablet Take 20 mg by mouth daily.     ??? etanercept (ENBREL) 50 MG/ML injection Inject 25 mg into the skin once. Once a week     ??? METHOTREXATE, ANTI-RHEUMATIC, PO   Take by mouth 5 tablets once a week     ??? QUEtiapine (SEROQUEL) 300 MG tablet Take 300 mg by mouth every evening      Reclast yearly    History   Substance Use Topics   ??? Smoking status: Current Every Day Smoker -- 0.50 packs/day     Types: Cigarettes   ??? Smokeless tobacco: Never Used      Comment: <1/2 ppd   ??? Alcohol Use: 0.6 oz/week     1 Not specified per week      Comment: rarely, usually no more than 2 drinks at a time     Family History   Problem Relation  Age of Onset   ??? Hypertension Father    ??? High Blood Pressure Father    ??? Anemia Mother    ??? Heart Disease Paternal Grandmother        Review of Systems  A comprehensive review of systems was negative except for what was noted in the HPI.     Physical Exam   Pulse 75   Temp(Src) 98.2 ??F (36.8 ??C)   Ht 5\' 4"  (1.626 m)   Wt 140 lb (63.504 kg)   BMI 24.02 kg/m2   SpO2 98%  Weight: 140 lb (63.504 kg) 130/90, 122/86  Constitutional: She is oriented to person, place, and time. She appears well-developed and well-nourished. No distress.   HENT:   Head: Normocephalic and atraumatic.   Mouth/Throat: Uvula is midline, oropharynx is clear and moist and mucous membranes are normal.   Eyes: Conjunctivae and EOM are normal. Pupils are equal, round, and reactive to light.   Neck: Trachea normal and normal range of motion. Neck supple. Carotid bruit is not present. No mass and no thyromegaly present.   Cardiovascular: Normal rate, regular rhythm, normal heart sounds and intact distal pulses.  Exam reveals no gallop and no friction rub.  No murmur heard.  Pulmonary/Chest: Effort normal and breath sounds normal. No respiratory distress. She has no wheezes. She has no rales.   Abdominal: Soft. Normal aorta and bowel sounds are  normal. She exhibits no distension and no mass. There is no hepatosplenomegaly. No tenderness.   Musculoskeletal: She exhibits no edema and no tenderness.   Neurological: She is alert and oriented to person, place, and time. She has normal strength. No cranial nerve deficit or sensory deficit. Coordination and gait normal.   Skin: Skin is warm and dry. No rash noted. No erythema.      Assessment:       There are no diagnoses linked to this encounter.  48 y.o. patient    1. Preop examination     2. Hypothyroidism, unspecified hypothyroidism type     3. Anxiety     4. Gastroesophageal reflux disease without esophagitis     5. Essential hypertension     6. Osteoporosis     7. Tobacco use disorder     8. Vitamin D deficiency  Cholecalciferol 2000 UNITS TABS      Plan:     1. Preoperative workup as follows: none  2. Change in medication regimen before surgery: None  3. No contraindications to planned surgery

## 2014-01-13 NOTE — Progress Notes (Signed)
Unable to reach pt for nurse interview. Multiple attempts since 10-15, messages left to call us back

## 2014-01-14 ENCOUNTER — Observation Stay: Admit: 2014-01-14 | Source: Ambulatory Visit

## 2014-01-14 LAB — TYPE AND SCREEN
ABO/Rh: A POS
Antibody Screen: NEGATIVE

## 2014-01-14 LAB — POCT GLUCOSE: POC Glucose: 139 mg/dL — ABNORMAL HIGH (ref 65–99)

## 2014-01-14 LAB — POC PREGNANCY UR-QUAL: Pregnancy, Urine: NEGATIVE

## 2014-01-14 MED ORDER — LABETALOL HCL 5 MG/ML IV SOLN
5 MG/ML | INTRAVENOUS | Status: DC | PRN
Start: 2014-01-14 — End: 2014-01-14

## 2014-01-14 MED ORDER — LIDOCAINE HCL 0.5 % IJ SOLN
0.5 % | INTRAMUSCULAR | Status: AC
Start: 2014-01-14 — End: ?

## 2014-01-14 MED ORDER — BUPIVACAINE HCL (PF) 0.25 % IJ SOLN
0.25 % | INTRAMUSCULAR | Status: AC
Start: 2014-01-14 — End: ?

## 2014-01-14 MED ORDER — ENALAPRILAT 1.25 MG/ML IV INJ
1.25 MG/ML | Freq: Once | INTRAVENOUS | Status: DC | PRN
Start: 2014-01-14 — End: 2014-01-14

## 2014-01-14 MED ORDER — GENTAMICIN SULFATE 40 MG/ML IJ SOLN
40 MG/ML | INTRAMUSCULAR | Status: AC
Start: 2014-01-14 — End: ?

## 2014-01-14 MED ORDER — LACTATED RINGERS IV SOLN
INTRAVENOUS | Status: DC
Start: 2014-01-14 — End: 2014-01-15
  Administered 2014-01-14: 23:00:00 via INTRAVENOUS

## 2014-01-14 MED ORDER — HYDROMORPHONE HCL 1 MG/ML IJ SOLN
1 MG/ML | INTRAMUSCULAR | Status: AC
Start: 2014-01-14 — End: 2014-01-14

## 2014-01-14 MED ORDER — ONDANSETRON HCL 4 MG/2ML IJ SOLN
4 MG/2ML | Freq: Once | INTRAMUSCULAR | Status: AC | PRN
Start: 2014-01-14 — End: 2014-01-14
  Administered 2014-01-15: 4 mg via INTRAVENOUS

## 2014-01-14 MED ORDER — CEFAZOLIN 2000 MG D5W 100 ML IVPB
Freq: Once | Status: AC
Start: 2014-01-14 — End: 2014-01-14
  Administered 2014-01-14: 13:00:00 2 g via INTRAVENOUS

## 2014-01-14 MED ORDER — OMEPRAZOLE MAGNESIUM 20 MG PO TBEC
20 MG | Freq: Every day | ORAL | Status: DC
Start: 2014-01-14 — End: 2014-01-14

## 2014-01-14 MED ORDER — SODIUM CHLORIDE 0.9 % IJ SOLN
0.9 % | INTRAMUSCULAR | Status: AC
Start: 2014-01-14 — End: ?

## 2014-01-14 MED ORDER — METFORMIN HCL 500 MG PO TABS
500 MG | Freq: Two times a day (BID) | ORAL | Status: DC
Start: 2014-01-14 — End: 2014-01-15
  Administered 2014-01-15: 13:00:00 500 mg via ORAL

## 2014-01-14 MED ORDER — NORMAL SALINE FLUSH 0.9 % IV SOLN
0.9 % | Freq: Two times a day (BID) | INTRAVENOUS | Status: DC
Start: 2014-01-14 — End: 2014-01-14

## 2014-01-14 MED ORDER — DULOXETINE HCL 60 MG PO CPEP
60 MG | Freq: Every day | ORAL | Status: DC
Start: 2014-01-14 — End: 2014-01-15
  Administered 2014-01-15: 13:00:00 60 mg via ORAL

## 2014-01-14 MED ORDER — LACTATED RINGERS IV SOLN
INTRAVENOUS | Status: DC
Start: 2014-01-14 — End: 2014-01-15
  Administered 2014-01-14: 13:00:00 via INTRAVENOUS

## 2014-01-14 MED ORDER — BACITRACIN 50000 UNITS IM SOLR
50000 units | INTRAMUSCULAR | Status: AC
Start: 2014-01-14 — End: ?

## 2014-01-14 MED ORDER — ONDANSETRON HCL 4 MG/2ML IJ SOLN
4 MG/2ML | Freq: Once | INTRAMUSCULAR | Status: AC
Start: 2014-01-14 — End: 2014-01-14
  Administered 2014-01-14: 13:00:00 4 mg via INTRAVENOUS

## 2014-01-14 MED ORDER — METOPROLOL SUCCINATE ER 25 MG PO TB24
25 MG | Freq: Every day | ORAL | Status: DC
Start: 2014-01-14 — End: 2014-01-15
  Administered 2014-01-15: 13:00:00 25 mg via ORAL

## 2014-01-14 MED ORDER — FENTANYL CITRATE 0.05 MG/ML IJ SOLN
0.05 MG/ML | INTRAMUSCULAR | Status: DC | PRN
Start: 2014-01-14 — End: 2014-01-14

## 2014-01-14 MED ORDER — BUPIVACAINE-EPINEPHRINE (PF) 0.5% -1:200000 IJ SOLN
INTRAMUSCULAR | Status: AC
Start: 2014-01-14 — End: ?

## 2014-01-14 MED ORDER — LIDOCAINE HCL (PF) 1 % IJ SOLN
1 % | INTRAMUSCULAR | Status: AC
Start: 2014-01-14 — End: ?

## 2014-01-14 MED ORDER — CEFAZOLIN SODIUM 1 G IJ SOLR
1 g | INTRAMUSCULAR | Status: AC
Start: 2014-01-14 — End: ?

## 2014-01-14 MED ORDER — DEXAMETHASONE SODIUM PHOSPHATE 4 MG/ML IJ SOLN
4 MG/ML | Freq: Once | INTRAMUSCULAR | Status: AC
Start: 2014-01-14 — End: 2014-01-14
  Administered 2014-01-14: 13:00:00 4 mg via INTRAVENOUS

## 2014-01-14 MED ORDER — LIDOCAINE HCL (PF) 1 % IJ SOLN
1 % | Freq: Once | INTRAMUSCULAR | Status: DC | PRN
Start: 2014-01-14 — End: 2014-01-14

## 2014-01-14 MED ORDER — GLYCOPYRROLATE 0.2 MG/ML IJ SOLN
0.2 MG/ML | Freq: Once | INTRAMUSCULAR | Status: AC
Start: 2014-01-14 — End: 2014-01-14
  Administered 2014-01-14: 13:00:00 0.2 mg via INTRAVENOUS

## 2014-01-14 MED ORDER — EPINEPHRINE HCL 1 MG/ML IJ SOLN
1 mg/mL | INTRAMUSCULAR | Status: AC
Start: 2014-01-14 — End: ?

## 2014-01-14 MED ORDER — HYDRALAZINE HCL 20 MG/ML IJ SOLN
20 MG/ML | INTRAMUSCULAR | Status: DC | PRN
Start: 2014-01-14 — End: 2014-01-14

## 2014-01-14 MED ORDER — METHYLENE BLUE 1 % IJ SOLN
1 % | INTRAMUSCULAR | Status: AC
Start: 2014-01-14 — End: ?

## 2014-01-14 MED ORDER — BUPIVACAINE-EPINEPHRINE (PF) 0.25% -1:200000 IJ SOLN
INTRAMUSCULAR | Status: AC
Start: 2014-01-14 — End: ?

## 2014-01-14 MED ORDER — BUPIVACAINE 0.25% ELASTOMERIC INFUSION
Status: DC
Start: 2014-01-14 — End: 2014-01-15
  Administered 2014-01-14: 22:00:00 270 mL

## 2014-01-14 MED ORDER — LORAZEPAM 2 MG/ML IJ SOLN
2 MG/ML | INTRAMUSCULAR | Status: DC | PRN
Start: 2014-01-14 — End: 2014-01-15
  Administered 2014-01-14: 0.5 mg via INTRAVENOUS

## 2014-01-14 MED ORDER — LEVOTHYROXINE SODIUM 25 MCG PO TABS
25 MCG | Freq: Every day | ORAL | Status: DC
Start: 2014-01-14 — End: 2014-01-15
  Administered 2014-01-15: 13:00:00 25 ug via ORAL

## 2014-01-14 MED ORDER — MORPHINE SULFATE (PF) 2 MG/ML IV SOLN
2 MG/ML | INTRAVENOUS | Status: DC | PRN
Start: 2014-01-14 — End: 2014-01-14
  Administered 2014-01-14 – 2014-01-15 (×3): 2 mg via INTRAVENOUS

## 2014-01-14 MED ORDER — FENTANYL CITRATE 0.05 MG/ML IJ SOLN
0.05 MG/ML | INTRAMUSCULAR | Status: DC | PRN
Start: 2014-01-14 — End: 2014-01-14
  Administered 2014-01-14: 25 ug via INTRAVENOUS

## 2014-01-14 MED ORDER — LACTATED RINGERS IV SOLN
INTRAVENOUS | Status: DC
Start: 2014-01-14 — End: 2014-01-15
  Administered 2014-01-15: via INTRAVENOUS

## 2014-01-14 MED ORDER — NORMAL SALINE FLUSH 0.9 % IV SOLN
0.9 % | INTRAVENOUS | Status: DC | PRN
Start: 2014-01-14 — End: 2014-01-14

## 2014-01-14 MED ORDER — LORAZEPAM 2 MG/ML IJ SOLN
2 MG/ML | INTRAMUSCULAR | Status: AC
Start: 2014-01-14 — End: 2014-01-15

## 2014-01-14 MED FILL — SODIUM CHLORIDE 0.9 % IJ SOLN: 0.9 % | INTRAMUSCULAR | Qty: 10

## 2014-01-14 MED FILL — CEFAZOLIN 2000 MG IN D5W 100 ML IVPB: Qty: 2

## 2014-01-14 MED FILL — LIDOCAINE HCL (PF) 1 % IJ SOLN: 1 % | INTRAMUSCULAR | Qty: 60

## 2014-01-14 MED FILL — LIDOCAINE HCL 0.5 % IJ SOLN: 0.5 % | INTRAMUSCULAR | Qty: 50

## 2014-01-14 MED FILL — DEXAMETHASONE SODIUM PHOSPHATE 4 MG/ML IJ SOLN: 4 MG/ML | INTRAMUSCULAR | Qty: 1

## 2014-01-14 MED FILL — CEFAZOLIN SODIUM 1 G IJ SOLR: 1 g | INTRAMUSCULAR | Qty: 1000

## 2014-01-14 MED FILL — GENTAMICIN SULFATE 40 MG/ML IJ SOLN: 40 MG/ML | INTRAMUSCULAR | Qty: 2

## 2014-01-14 MED FILL — SENSORCAINE-MPF/EPINEPHRINE 0.25% -1:200000 IJ SOLN: INTRAMUSCULAR | Qty: 60

## 2014-01-14 MED FILL — MORPHINE SULFATE (PF) 2 MG/ML IV SOLN: 2 mg/mL | INTRAVENOUS | Qty: 1

## 2014-01-14 MED FILL — EPINEPHRINE HCL 1 MG/ML IJ SOLN: 1 mg/mL | INTRAMUSCULAR | Qty: 1

## 2014-01-14 MED FILL — METHYLENE BLUE 1 % IJ SOLN: 1 % | INTRAMUSCULAR | Qty: 10

## 2014-01-14 MED FILL — BACITRACIN 50000 UNITS IM SOLR: 50000 units | INTRAMUSCULAR | Qty: 1

## 2014-01-14 MED FILL — ONDANSETRON HCL 4 MG/2ML IJ SOLN: 4 MG/2ML | INTRAMUSCULAR | Qty: 2

## 2014-01-14 MED FILL — BUPIVACAINE 0.25% ELASTOMERIC INFUSION: Qty: 270

## 2014-01-14 MED FILL — SENSORCAINE-MPF/EPINEPHRINE 0.5% -1:200000 IJ SOLN: INTRAMUSCULAR | Qty: 60

## 2014-01-14 MED FILL — LORAZEPAM 2 MG/ML IJ SOLN: 2 MG/ML | INTRAMUSCULAR | Qty: 1

## 2014-01-14 MED FILL — SENSORCAINE-MPF 0.25 % IJ SOLN: 0.25 % | INTRAMUSCULAR | Qty: 30

## 2014-01-14 MED FILL — GLYCOPYRROLATE 0.2 MG/ML IJ SOLN: 0.2 MG/ML | INTRAMUSCULAR | Qty: 1

## 2014-01-14 MED FILL — FENTANYL CITRATE 0.05 MG/ML IJ SOLN: 0.05 MG/ML | INTRAMUSCULAR | Qty: 2

## 2014-01-14 MED FILL — HYDROMORPHONE HCL 1 MG/ML IJ SOLN: 1 MG/ML | INTRAMUSCULAR | Qty: 2

## 2014-01-14 NOTE — Progress Notes (Signed)
The following education and goals will be achieved upon completion of the patient's care in SDS:    IdIdentify the learner who is being assessed for education: Patient  Ability to Learn:  Exhibits ability to grasp concepts and respond to questions: High  Ready to Learn: Yes  calm  Preferred Method of Learning:  verbal  Barriers to Learning: Verbalizes interest  Special Considerations due to cultural, religious, spiritual beliefs:  no  Language: English  Language Interpreter: no    [place]  [x] Appropriate evaluation / integration of data as delineated by ASPAN Standards of [place].    Pain scale and pain management  [x] Patient will verbalize understanding of pain scale and pain management.  [x] Pre-operative determination of patient???s anticipated Post-Operative pain goal: 4 of 10 on 10 point scale post op goal  [x] Patient will verbalize plan for pain management  [] Other     Compliance with Pre-op Instructions - Patient reports compliance with:  [x] Taking prescribed home meds before arrival  [] Surgical prep instructions specific to the patient's surgery    Fall Risk - PreOperatively   [] No preoperative risk identified  [x] Preop risk identified:      []  Sensory deficit     []  Motor deficit     []  Balance problem     []  Home medication     [] Uses assistive device to ambulate      [] History of a Fall within the last 30 days  [x] Due to Perioperative medication administration    Goal(s) for fall prevention:  [x]  Prevent fall or injury by use of encouraging call light for assistance, side rails, and assisting with activity.  [x] Patient / Significant other verbalize understanding of need to call for assistance prior to getting out of bed.    Infection Precautions                                                                                                   [x]  Patient understands implementation of [facility]urg[facility] Site Infection precautions  [x]  Handwashing, skin prep  prior to IV insertion, hair clipped at surgical site if needed  [x]  Pre op antibiotic, if ordered    Patient Safety  [x]  Patient identification  [x]  Site verification (See Universal Protocol Checklist)  [x]  General Safety precautions  [x]  Side rails up, bed/stretcher low position and wheels locked  [x]  Call light within reach  [x]  Patient instructed to call for assistance prior to getting out of bed    Instructions - Discharge planning for Outpatients  [x]  Patient / significant other voices understanding of home care and follow up procedures.  Anticipated Special Needs upon discharge:  []  Cooling device  []  Crutches  []  Walker  []  Wound Support device   [] Drain  [] Other   []  See [facility] Discharge Instructions    Instructions - Discharge planning for Admitted Patients  [x]  Patient / Significant other understands plan for admission after surgery  []  Patient / Significant other understands plan for anticipated discharge disposition  01/14/2014 8:30 AM

## 2014-01-14 NOTE — Progress Notes (Signed)
Patient arrived to PACU spot 15. Attached to monitoring system. REport received per CRNA Angie. No problems reported intraoperatively. VSS.

## 2014-01-14 NOTE — Progress Notes (Signed)
PACU Transfer Note    Filed Vitals:    01/14/14 1945   BP: 135/87   Pulse: 84   Temp: 97.7 ??F (36.5 ??C)   Resp: 15       In: 5850 [I.V.:5850]  Out: 2740 [Urine:2425; Drains:115]    Pain assessment:  Pain Level: 7    Report given to Receiving unit RN.    01/14/2014 8:20 PM

## 2014-01-14 NOTE — Progress Notes (Signed)
PACU Transfer Note    Filed Vitals:    01/14/14 2015   BP: 127/82   Pulse: 84   Temp:    Resp: 14   97.7    In: 5850 [I.V.:5850]  Out: 2740 [Urine:2425; Drains:115]    Pain assessment:   Pain Level: 7    Report given to Receiving unit RN.    01/14/2014 8:29 PM

## 2014-01-14 NOTE — Other (Addendum)
Patient Acct Nbr:  [id number]  Primary AUTH/CERT:    Primary Insurance Company Name:   SELF PAY PLANS  Primary Insurance Plan Name:  SELF PAY COSMETIC ONLY  Primary Insurance Group Number:    Primary Insurance Plan Type: [employer]  Primary Insurance Policy Number:  [id number]

## 2014-01-14 NOTE — Brief Op Note (Signed)
Brief Postoperative Note    Rebecca Jenkins  Date of Birth:  July 04, 1965  [id number]    Pre-operative Diagnosis: Breast ptosis, abdominal lipodystrophy, aging face, ventral hernia    Post-operative Diagnosis: Same    Procedure: Implant exchange, Mastopexy, liposuction flanks bilateral, repair ventral hernia.    Anesthesia: general    Surgeons/Assistants: Rebecca Jenkins    Estimated Blood Loss: less than 50     Complications: None    Specimens: Was not obtained    Findings: ventral hernia    Electronically signed by Rebecca Fretaniel Harbor Vanover, MD on 01/14/2014 at 6:46 PM

## 2014-01-14 NOTE — Anesthesia Post-Procedure Evaluation (Signed)
Anesthesia Post-op Note    Patient: Rebecca LankJacqueline A Speigner    Procedure(s) Performed:  abdominoplasty implant exchange and mastopexy    DOS : 01/14/2014     Surgeon: Raphael Gibneyaniel G Kuy, MD     Anesthesia type: General    Post-op assessment:  Anesthetic Problems: no   Last Vitals:     BP:  127/82    Pulse:  84    Temp:  97.7 F   Resp:  14    SpO2:  96%    Cardiovascular System Stable: yes  Respiratory Function: Airway Patent yes  ETT no  Ventilator no  Level of consciousness: awake, alert  and oriented  Post-op pain: Adequate analgesia  Hydration Adequate: yes  Nausea/Vomiting:no  Other Issues:     Lovena Le. Muzammil Bruins, MD

## 2014-01-14 NOTE — Progress Notes (Signed)
Dr. Frederik PearKuy called in previously, aware of abd dressing with drainage, dressing changed and new abdominal binder applied, more orders placed per MD.  MD aware of pt's pain level, meds ordered for the floor.

## 2014-01-14 NOTE — Progress Notes (Signed)
Patient with support bra intact and abdominal binder intact, with moderate amount of bloody drainage.

## 2014-01-14 NOTE — Op Note (Signed)
PATIENT NAME:                 PA #:            MR #Rebecca, Jenkins             [id number]       [id number]            SURGEON:                              SURG DATE:  DIS DATE:          Hermine Messick, Rebecca Jenkins                 01/14/2014                      PRIMARY CARE PHYSICIAN:              REFERRING PHYSICIAN:            DATE OF BIRTH:   AGE:           PATIENT TYPE:     RM #:              Dec 27, 1965       48             OVJ               JSU                   PREOPERATIVE DIAGNOSIS(ES):    1.  Breast ptosis, capsule contracture.   2.  Abdominal lipodystrophy.   3.  Localized adiposity, flanks bilateral.   4.  Ventral hernia.   5.  Aging face.     POSTOPERATIVE DIAGNOSIS(ES):    1.  Breast ptosis, capsule contracture.   2.  Abdominal lipodystrophy.   3.  Localized adiposity, flanks bilateral.   4.  Ventral hernia.   5.  Aging face.     PROCEDURE(S) PERFORMED:    1.  Bilateral implant exchange and mastopexy.    2.  Removal of saline implants and replacement with Mentor High Profile  Memory Gel implants, 300 mL each.    3.  Abdominoplasty with rectus diastasis and ventral hernia repair.    4.  Power-assisted liposuction of each flank.  5.  Fat grafting to the face.     SURGEON:  Hermine Messick, Rebecca Jenkins     ANESTHESIA:  General.     ESTIMATED BLOOD LOSS:  50 mL.     INDICATIONS:  The patient requests correction of the breast ptosis and  proceeding at the same time with reducing the breast size with removal of her  old breast implants.  She also wants to proceed with an abdominoplasty to  correct post partum changes with inclusion of liposuction of the hips.  Finally, it is agreed to proceed with fat grafting to the face by using the  harvested fat from the flank region and placing it into the aging areas of  her perioral region, cheek and malar locations.       DETAILS OF PROCEDURE:  The patient is marked in the preoperative area in the  laying down, as well as standing up position.  The markings  were placed with  the presence of the husband and  all markings were accepted and sites of  surgery agreed upon.       The patient was then taken to the operating room and placed in supine  position.  After general anesthesia was induced, lower extremity  antithrombotic sequential compression devices are placed, as well as a Foley  catheter.  The breasts and the abdomen are prepped and draped in the usual  sterile fashion.  The prepping extends to the back side to laterally over the  flanks.  The procedure begins with the right breast.  Lidocaine 1% plain and  0.25% Marcaine with epinephrine is used to infiltrate the planned incision  sites.       The incision starts with a periareolar incision at a diameter of 4.2 cm,  which was placed with the precut template, additional excision of the old  areolar skin following a dome-shaped marking in the outer circle, in the  lower arc of the breast, deepitheliazation of an ellipse that was marked preoperatively.   Additional local was injected and incisions are deepened along the lower skin  edges until the capsule is reached.  Some capsular scarring is present but no  significant thickening or inflammation is identified.  Small amount of  intracapsular fluid is aspirated.      The lowest portion of the capsule is excised and the subglandular pocket is  entered.  The saline implant is removed.  The implant is identified as a  McGhan implant 240 mL.  The implant is intact.  The implant is textured.  The  implant is weighed and appears to weigh about 350 g; 350 g is translated to  about 350 mL equaling about 100% overfill.  The nipple-areolar complex is now  sewn into its new bed with 3-0 and 4-0 Monocryl.  Partial closure of the  vertical opening with staples.       We now try out a few sizer implants and it appears that about 300 mL of a  Memory Gel High Profile implant would maintain the patients requested breast  fullness but allow for reduction and better shaping of the  breast.  The sizer  is removed and the pocket is copiously irrigated with triple antibiotic  solution.  Hemostasis is obtained in the lower pole.  An implant is now  introduced.  The permanent implant is a Memory Gel Mentor High Profile  implant 300 mL.       The left breast is then addressed in similar fashion.  Again, an intact  saline implant can be removed.  Again, the implant is identified as 240 mL.   However, the implant is significantly overfilled as well.  The pocket is also  irrigated and mastopexy steps are taken.       With both breasts now containing the 300 mL Mentor High Profile permanent  implants the patient is brought up into a sitting position and final markings  are placed in the lower arc and inframammary fold, where skin excess is  outlined.  Additional local is injected and all the skin is removed.  An  additional small amount of breast tissue is removed from the left side to  even out for an asymmetry.  Final closure of all incisions using 2-0 Vicryl  for the deep dermis and 3-0 and 4-0 Monocryl for the more superficial layers.     At this time, the abdomen is addressed, starting with a curvilinear incision,  following the preoperative markings suprapubically.  Also, the same local is  injected.  The preaponeurotic dissection is done with the electrocautery.   Encountered perforators are coagulated.  The umbilicus is detached from the  flap.  The umbilicus, unfortunately, contains a piercing hole, as well as an  old laparoscopic scar.  The dissection continues up to the costal margin in  the center.  The dissection over the aponeurosis reveals a 2.5-cm hernia  opening supraumbilically within the midline, correlating with an old  laparoscopic entry site.  The hernia sac is separated from the abdominal wall  and is returned into the abdomen and the defect is repaired with 3  interrupted sutures using 0 Ethibond.      The rectus diastasis and midline separation is outlined with methylene  blue  and additional local is injected.  At this time also, both flank regions are  tumesced.  Each side receives about 500 mL of tumescent solution.  The  abdominoplasty continues by placing the 0 Ethibond plication suture along the  midline and additionally reinforcing the ventral hernia repair.  The  plication extends from the xiphoid down to the suprapubic region.  On-Q pain  catheters are now introduced into the plication through percutaneous  placement.  Two 10-mm Jackson-Pratt flat drains are placed in the lower  quadrants.       At this time, liposuction is performed of each flank region using  power-assisted liposuction.  The 3-mm cannula is mainly used to harvest the  fat into a sterile container.  The aspirated volume is approximately 280 mL  on the right side and 250 mL on the left side.      At this time, the patient is flexed in semi-Fowler position and closure of  the abdomen is performed after excess skin has been excised.  The removed  skin weighs about 2 pounds.  The closure is performed with a 2-0 Vicryl for  the superficial fascia and also deep dermis.  Final skin closure was 3-0  Monocryl in 2 layers.  The umbilicus is brought out through a new opening in  the flap in the midline and secured with 4-0 Monocryl and 5-0 nylon.  The  drains are secured with 3-0 silk within the mons pubis region.  On-Q pain  pump catheters are secured with Tegaderms.  Breast incision and abdominal  incision are now secured with Prineo mesh and Dermabond.       At this time, the harvested fat is separated from blood and fluid and  transferred into 1-cc syringes.  The patients face is now prepped and draped  in the usual sterile fashion, using local to infiltrate the planned stab  incision sites.  With an 11-blade, 2 stab incisions are made within the  marionette line region, 1 in each malar region, as well.  Using an 18-gauge  blunt cannula and 1-cc syringe filled with fat graft, augmentation is  performed of the  perioral region, as well as the infraorbital and malar  region.  A total of 25 mL of fat graft was placed.  The stab incisions are  closed with 6-0 nylon.      The patient tolerated all these procedures very well and was extubated and  taken to the recovery room in stable condition.                                             Rebecca Fannin GERARD Srishti Strnad,  Rebecca Jenkins     [id number]  DD: 01/14/2014 19:16  DT: 01/14/2014 20:45  Job #: [id number]  CC: Ermalene Searing, PA  CC: Hermine Messick, Rebecca Jenkins

## 2014-01-14 NOTE — Progress Notes (Addendum)
Pt has not arrived in SDS. Dr. Frederik PearKuy and OR desk made aware.  [id number] Pt arrived for surgery, Dr. Frederik PearKuy and OR desk made aware. Consent addended per Dr. Frederik PearKuy. [place]

## 2014-01-14 NOTE — Progress Notes (Signed)
Pt admitted to 5307-2 from PACU. Pt alert and oriented. VSS. Pt rating pain 8/10, but denies need for pain medication. Pt with abd pad and abdominal binder to abdomen and gauze to bilateral breasts. Pt using ice packs on face. IV fluids running. Pt oriented to room and call light and instructed to call with needs.

## 2014-01-14 NOTE — Progress Notes (Signed)
Ancef 2 gr iv to or

## 2014-01-14 NOTE — Anesthesia Pre-Procedure Evaluation (Signed)
Department of Anesthesiology  Preprocedure Note       Name:  Rebecca Jenkins   Age:  48 y.o.  DOB:  1965-10-15                                          MRN:  [id number]         Date:  01/14/2014      Surgeon: Raphael Gibney, MD abdominoplasty implant exchange and mastopexy        PAST SURGICAL HISTORY:  has past surgical history that includes Cesarean section; Cholecystectomy (05/03); liver biopsy; Tubal ligation; Breast enhancement surgery (2001); back surgery; knee surgery; and Elbow surgery.   Medications prior to admission: Prescriptions prior to admission:Cholecalciferol 2000 UNITS TABS, Take 1 tablet by mouth daily  SUMAtriptan (IMITREX) 100 MG tablet, Take 1 tablet by mouth once as needed for Migraine  levothyroxine (LEVOTHROID) 25 MCG tablet, Take 1 tablet by mouth daily  DULoxetine (CYMBALTA) 60 MG capsule, Take 1 capsule by mouth daily  metFORMIN (GLUCOPHAGE) 500 MG tablet, TAKE ONE TABLET BY MOUTH TWICE A DAY WITH MEALS  metoprolol (TOPROL-XL) 25 MG XL tablet, TAKE ONE TABLET BY MOUTH DAILY (Patient taking differently: take one tablet twice daily)  amphetamine-dextroamphetamine (ADDERALL) 30 MG tablet, Take 30 mg by mouth 2 times daily.  clonazePAM (KLONOPIN) 1 MG tablet, Take 1 mg by mouth nightly as needed for Anxiety.  omeprazole (PRILOSEC OTC) 20 MG tablet, Take 20 mg by mouth daily.  etanercept (ENBREL) 50 MG/ML injection, Inject 25 mg into the skin once. Once a week  METHOTREXATE, ANTI-RHEUMATIC, PO,  Take by mouth 5 tablets once a week  QUEtiapine (SEROQUEL) 300 MG tablet, Take 300 mg by mouth every evening     Current medications:    Current Facility-Administered Medications   Medication Dose Route Frequency Provider Last Rate Last Dose   ??? lactated ringers infusion   Intravenous Continuous Ashima Dhamija, MD       ??? ceFAZolin (ANCEF) 2 g in dextrose 5% 100 mL IVPB  2 g Intravenous Once Raphael Gibney, MD           Allergies:    Allergies   Allergen Reactions   ??? Codeine    ??? Other      ORAL PAIN  MED ? NAME   ??? Hydrocodone Nausea Only   ??? Oxycodone Nausea Only       Problem List:    Patient Active Problem List   Diagnosis   ??? Other chronic nonalcoholic liver disease   ??? Tobacco use disorder   ??? Rheumatoid arthritis (HCC)   ??? Essential hypertension   ??? Esophageal reflux   ??? Anxiety   ??? Hypothyroidism   ??? Osteoporosis       Past Medical History:        Diagnosis Date   ??? Arthritis      RHEUMATOID   ??? Anxiety    ??? Hypertension    ??? ADHD    ??? GERD (gastroesophageal reflux disease)    ??? Constipation    ??? Irritable bowel syndrome    ??? Scoliosis    ??? Fibromyalgia    ??? Other chronic nonalcoholic liver disease    ??? Tobacco use disorder    ??? Rheumatoid arthritis(714.0) (HCC)        Past Surgical History:        Procedure  Laterality Date   ??? Cesarean section       X3   ??? Cholecystectomy  05/03   ??? Liver biopsy       NON ALCOHOLIC CIRRHOSIS   ??? Tubal ligation     ??? Breast enhancement surgery  2001   ??? Back surgery       L1-2   ??? Knee surgery       left   ??? Elbow surgery       left ulnar nerve       Social History:    History   Substance Use Topics   ??? Smoking status: Current Every Day Smoker -- 0.50 packs/day     Types: Cigarettes   ??? Smokeless tobacco: Never Used      Comment: <1/2 ppd   ??? Alcohol Use: 0.6 oz/week     1 Not specified per week      Comment: rarely, usually no more than 2 drinks at a time                                Ready to quit: Not Answered  Counseling given: Not Answered      Vital Signs (Current):   Filed Vitals:    01/14/14 0818   BP: 147/89   Pulse: 77   Temp: 97.5 ??F (36.4 ??C)   TempSrc: Oral   Resp: 20   SpO2: 98%                                              BP Readings from Last 3 Encounters:   01/14/14 147/89   01/13/14 130/90   12/26/13 124/80       NPO Status:                                                                                 BMI:   Wt Readings from Last 3 Encounters:   01/13/14 140 lb (63.504 kg)   01/13/14 141 lb (63.957 kg)   12/26/13 141 lb (63.957 kg)     There is no  weight on file to calculate BMI.    Anesthesia Evaluation  Patient summary reviewed history of anesthetic complications (woke up during a knee surgery ):   Airway: Mallampati: III TM distance: >3 FB Neck ROM: full  Dental: (-) normal exam      Pulmonary:,      Cardiovascular:  (+) hypertension:        Neuro/Psych:  (+) neuromuscular disease (fibromyalgia):, psychiatric history (anxiety and ADHD):   GI/Hepatic/Renal:  (+) GERD:liver disease (hx of liver problems 10 years secondary to medication):      Comments: IBS   Endo/Other:  (+) hypothyroidism: arthritis (scoliosis): OA and rheumatoid.   Abdominal:                         Anesthesia Plan    ASA 2     general     intravenous induction  Anesthetic plan and risks discussed with patient.            Erenest Blank, MD   01/14/2014

## 2014-01-14 NOTE — Progress Notes (Signed)
Report given to Porfirio Oaranielle Gulley RN

## 2014-01-14 NOTE — Progress Notes (Signed)
IS instructed, pt inspired 500-750 X 10

## 2014-01-15 MED ORDER — OXYCODONE HCL 5 MG PO TABS
5 MG | ORAL | Status: DC | PRN
Start: 2014-01-15 — End: 2014-01-14

## 2014-01-15 MED ORDER — NORMAL SALINE FLUSH 0.9 % IV SOLN
0.9 % | INTRAVENOUS | Status: DC | PRN
Start: 2014-01-15 — End: 2014-01-15

## 2014-01-15 MED ORDER — PANTOPRAZOLE SODIUM 40 MG PO TBEC
40 MG | Freq: Every day | ORAL | Status: DC
Start: 2014-01-15 — End: 2014-01-15
  Administered 2014-01-15: 11:00:00 40 mg via ORAL

## 2014-01-15 MED ORDER — MORPHINE SULFATE (PF) 2 MG/ML IV SOLN
2 MG/ML | INTRAVENOUS | Status: DC | PRN
Start: 2014-01-15 — End: 2014-01-15
  Administered 2014-01-15 (×5): 2 mg via INTRAVENOUS

## 2014-01-15 MED ORDER — ONDANSETRON HCL 4 MG/2ML IJ SOLN
4 MG/2ML | Freq: Four times a day (QID) | INTRAMUSCULAR | Status: DC | PRN
Start: 2014-01-15 — End: 2014-01-15

## 2014-01-15 MED ORDER — ACETAMINOPHEN 325 MG PO TABS
325 MG | ORAL | Status: DC | PRN
Start: 2014-01-15 — End: 2014-01-15

## 2014-01-15 MED ORDER — MORPHINE SULFATE (PF) 4 MG/ML IV SOLN
4 MG/ML | INTRAVENOUS | Status: AC
Start: 2014-01-15 — End: 2014-01-15

## 2014-01-15 MED ORDER — DIPHENHYDRAMINE HCL 25 MG PO TABS
25 MG | Freq: Four times a day (QID) | ORAL | Status: DC | PRN
Start: 2014-01-15 — End: 2014-01-15
  Administered 2014-01-15: 14:00:00 25 mg via ORAL

## 2014-01-15 MED ORDER — NORMAL SALINE FLUSH 0.9 % IV SOLN
0.9 % | Freq: Two times a day (BID) | INTRAVENOUS | Status: DC
Start: 2014-01-15 — End: 2014-01-15
  Administered 2014-01-15: 13:00:00 10 mL via INTRAVENOUS

## 2014-01-15 MED FILL — LEVOTHYROXINE SODIUM 25 MCG PO TABS: 25 MCG | ORAL | Qty: 1

## 2014-01-15 MED FILL — MORPHINE SULFATE (PF) 2 MG/ML IV SOLN: 2 mg/mL | INTRAVENOUS | Qty: 1

## 2014-01-15 MED FILL — PROTONIX 40 MG PO TBEC: 40 MG | ORAL | Qty: 1

## 2014-01-15 MED FILL — MORPHINE SULFATE (PF) 2 MG/ML IV SOLN: 2 MG/ML | INTRAVENOUS | Qty: 1

## 2014-01-15 MED FILL — PHENYLEPHRINE HCL 10 MG/ML IJ SOLN: 10 MG/ML | INTRAMUSCULAR | Qty: 1

## 2014-01-15 MED FILL — METOPROLOL SUCCINATE ER 25 MG PO TB24: 25 MG | ORAL | Qty: 1

## 2014-01-15 MED FILL — CEFAZOLIN SODIUM 1 G IJ SOLR: 1 g | INTRAMUSCULAR | Qty: 1000

## 2014-01-15 MED FILL — MORPHINE SULFATE (PF) 4 MG/ML IV SOLN: 4 MG/ML | INTRAVENOUS | Qty: 1

## 2014-01-15 MED FILL — PROPOFOL 10 MG/ML IV EMUL: 10 MG/ML | INTRAVENOUS | Qty: 40

## 2014-01-15 MED FILL — ZEMURON 50 MG/5ML IV SOLN: 50 MG/5ML | INTRAVENOUS | Qty: 10

## 2014-01-15 MED FILL — CYMBALTA 60 MG PO CPEP: 60 MG | ORAL | Qty: 1

## 2014-01-15 MED FILL — AKWA TEARS 83-15 % OP OINT: 83-15 % | OPHTHALMIC | Qty: 1

## 2014-01-15 MED FILL — METFORMIN HCL 500 MG PO TABS: 500 MG | ORAL | Qty: 1

## 2014-01-15 MED FILL — PANTOPRAZOLE SODIUM 40 MG PO TBEC: 40 MG | ORAL | Qty: 1

## 2014-01-15 MED FILL — NEOSTIGMINE METHYLSULFATE 1 MG/ML IJ SOLN: 1 MG/ML | INTRAMUSCULAR | Qty: 10

## 2014-01-15 MED FILL — FENTANYL CITRATE 0.05 MG/ML IJ SOLN: 0.05 MG/ML | INTRAMUSCULAR | Qty: 5

## 2014-01-15 MED FILL — DIPHENHYDRAMINE HCL 25 MG PO TABS: 25 MG | ORAL | Qty: 1

## 2014-01-15 MED FILL — ONDANSETRON HCL 4 MG/2ML IJ SOLN: 4 MG/2ML | INTRAMUSCULAR | Qty: 2

## 2014-01-15 MED FILL — DEXAMETHASONE SODIUM PHOSPHATE 20 MG/5ML IJ SOLN: 20 MG/5ML | INTRAMUSCULAR | Qty: 5

## 2014-01-15 MED FILL — MIDAZOLAM HCL 2 MG/2ML IJ SOLN: 2 MG/ML | INTRAMUSCULAR | Qty: 4

## 2014-01-15 MED FILL — GLYCOPYRROLATE 0.4 MG/2ML IJ SOLN: 0.4 MG/2ML | INTRAMUSCULAR | Qty: 4

## 2014-01-15 MED FILL — LIDOCAINE HCL (CARDIAC) 20 MG/ML IV SOLN: 20 MG/ML | INTRAVENOUS | Qty: 5

## 2014-01-15 NOTE — Progress Notes (Signed)
Discharge orders received.  Verbal and written discharge instructions along with medication instructions given to patient.  JP drain teaching given to patient and husband.  Abdominal binder instructions given to patient and husband.  Verbalized understanding.  Pain controlled with ordered/prescribed medication.  Voiding, tolerating diet.  Ambulating without difficulty.

## 2014-01-15 NOTE — Plan of Care (Signed)
Problem: Falls - Risk of  Goal: Absence of falls  Outcome: Ongoing  Pt free from falls. Bed in lowest position. Wheels locked. 2/4 bed rails up. Call light within reach. Bed alarm on. Pt aware to call for assistance before getting out of bed. Will continue to monitor for safety.        Problem: Pain:  Goal: Pain level will decrease  Pain level will decrease  Outcome: Ongoing  Pt with intermittent complaints of pain. Pt medicated per orders with Morphine. Pt verbalizes relief. Pt aware to call if more pain medication is needed. Will continue to monitor pain level.

## 2014-01-15 NOTE — Plan of Care (Signed)
Problem: Falls - Risk of  Goal: Absence of falls  Outcome: Completed Date Met:  01/15/14  Remains safe and free from falls and injury during hospital stay.    Problem: Pain:  Goal: Pain level will decrease  Pain level will decrease   Outcome: Completed Date Met:  01/15/14  Pain controlled with ordered/prscribed pain medication.

## 2014-01-16 NOTE — H&P (Signed)
Unchanged form H&P performed 10-21,2015 by Darrol AngelAllison Johnson, PA.

## 2014-01-17 NOTE — Discharge Summary (Signed)
PATIENT NAME:                 PA #:            MR #Rebecca Jenkins, Rebecca Jenkins             [id number]       [id number]            ATTENDING PHYSICIAN:                  ADM DATE:   DIS DATE:          Hermine Messick, MD                 01/14/2014  01/15/2014          PRIMARY CARE PHYSICIAN:              REFERRING PHYSICIAN:            DATE OF BIRTH:   AGE:           PATIENT TYPE:                         1966/03/13       48             [facility]                                      ADMISSION  DIAGNOSES:  1.  Breast ptosis and capsular contracture.  2.  Abdominal lipodystrophy.  3.  Localized adiposity flanks bilateral.  4.  Ventral hernia.  5.  Aging face.  6.  Hypertension.  7.  Rheumatoid arthritis.  8.  Hypothyroidism.  9.  Osteoporosis.  10.  Anxiety.     DISCHARGE DIAGNOSIS(ES):       HISTORY OF PRESENT ILLNESS:    This is a predominantly an encounter for  cosmetic surgery. The elective surgery planned included  breast implant  exchange with mastopexy, abdominoplasty with liposuction of the flanks, fat  grafting to the face.  During the abdominoplasty a ventral hernia was  identified which was repaired as well.      HOSPITAL COURSE:    Patient was admitted postoperatively for monitoring due  to the complexity of the surgery with risk of bleeding, exacerbation of  hypertension, and pulmonary insufficiency with a long history of tobacco  abuse.       The hospital course was uneventful. The patient was discharged in the morning  after intact incisions were identified.   The abdominal flap was pink and the  remainder of all surgical sites showed no abnormalities.  The patient was  stable and afebrile.  Pain level was moderate.  Patient was instructed about  the postoperative care in the presence of her husband.  Patient was  instructed to follow up with me Monday, October 26th.  Patient was also  instructed not to resume Methotrexate and Enbrel for her rheumatoid  arthritis, but resume all her other preoperative  medications which included  Levothyroxine, Cymbalta, Metformin, Metoprolol, Adderall and Klonopin.  Hermine Messick, MD     [id number]  DD: 01/17/2014 18:53  DT: 01/17/2014 20:26  Job #: [id number]  CC: Ermalene Searing, PA  CC: Hermine Messick, MD

## 2014-03-30 MED ORDER — DULOXETINE HCL 60 MG PO CPEP
60 MG | ORAL_CAPSULE | ORAL | Status: DC
Start: 2014-03-30 — End: 2014-05-30

## 2014-04-10 MED ORDER — LEVOTHYROXINE SODIUM 25 MCG PO TABS
25 MCG | ORAL_TABLET | Freq: Every day | ORAL | Status: DC
Start: 2014-04-10 — End: 2014-05-04

## 2014-04-30 MED ORDER — METOPROLOL TARTRATE 25 MG PO TABS
25 MG | ORAL_TABLET | ORAL | Status: DC
Start: 2014-04-30 — End: 2014-10-26

## 2014-05-04 MED ORDER — LEVOTHYROXINE SODIUM 25 MCG PO TABS
25 MCG | ORAL_TABLET | Freq: Every day | ORAL | Status: DC
Start: 2014-05-04 — End: 2014-09-29

## 2014-05-04 NOTE — Telephone Encounter (Signed)
Patient lost her bottle of medication. Please send in new prescription to  [facility]( [facility] [address] - [place], [place] - [id number] - P [phone] F [phone])

## 2014-05-07 ENCOUNTER — Encounter: Attending: Physician Assistant

## 2014-06-01 MED ORDER — DULOXETINE HCL 60 MG PO CPEP
60 MG | ORAL_CAPSULE | ORAL | Status: DC
Start: 2014-06-01 — End: 2014-06-30

## 2014-06-30 MED ORDER — DULOXETINE HCL 60 MG PO CPEP
60 MG | ORAL_CAPSULE | ORAL | Status: DC
Start: 2014-06-30 — End: 2014-08-31

## 2014-07-15 ENCOUNTER — Ambulatory Visit: Admit: 2014-07-15 | Discharge: 2014-07-15 | Payer: BLUE CROSS/BLUE SHIELD | Attending: Physician Assistant

## 2014-07-15 DIAGNOSIS — M009 Pyogenic arthritis, unspecified: Secondary | ICD-10-CM

## 2014-07-15 MED ORDER — SULFAMETHOXAZOLE-TRIMETHOPRIM 800-160 MG PO TABS
800-160 MG | ORAL_TABLET | Freq: Two times a day (BID) | ORAL | Status: AC
Start: 2014-07-15 — End: 2014-07-25

## 2014-07-15 MED ORDER — CEPHALEXIN 500 MG PO CAPS
500 MG | ORAL_CAPSULE | Freq: Three times a day (TID) | ORAL | Status: DC
Start: 2014-07-15 — End: 2015-02-08

## 2014-07-15 NOTE — Patient Instructions (Signed)
Rebecca Jenkins was seen today for wound infection.    Diagnoses and associated orders for this visit:    Infection of elbow  - sulfamethoxazole-trimethoprim (BACTRIM DS) 800-160 MG per tablet; Take 1 tablet by mouth 2 times daily for 10 days  - cephALEXin (KEFLEX) 500 MG capsule; Take 1 capsule by mouth 3 times daily           Keep it covered, sent out for a culture and will call you with results.

## 2014-07-15 NOTE — Progress Notes (Signed)
Subjective:      Patient ID: Isa RankinJacqueline A Spurling is a 49 y.o. female.    HPI Patient presents today for a wound that developed over the weekend on her right elbow.  Patient states that she feels weak and fatigued and has been running a fever. It has been draining a little, elbow area swollen and little red. She says it is from a surgery several years ago that "they didn't close".     Review of Systems   Constitutional: Negative for fever, chills and appetite change.   Musculoskeletal: Positive for joint swelling.   Skin: Positive for wound.   Neurological: Negative for dizziness, weakness and headaches.       Objective:   Physical Exam   Constitutional: She is oriented to person, place, and time. Vital signs are normal. She appears well-developed and well-nourished. She is cooperative.   Musculoskeletal:   Full ROM and strength of right elbow   Neurological: She is alert and oriented to person, place, and time. No sensory deficit.   Skin:   Right elbow with some edema, mild erythema, not warm to touch, there is some light green drainage from posterior elbow       Assessment:      Adela LankJacqueline was seen today for wound infection.    Diagnoses and associated orders for this visit:    Infection of elbow  - sulfamethoxazole-trimethoprim (BACTRIM DS) 800-160 MG per tablet; Take 1 tablet by mouth 2 times daily for 10 days  - cephALEXin (KEFLEX) 500 MG capsule; Take 1 capsule by mouth 3 times daily    Vitamin D deficiency               Plan:      Sent off wound culture, due for TSH and Vit D level, call with any concerns.

## 2014-07-15 NOTE — Progress Notes (Signed)
I have reviewed the history and physical note and findings.

## 2014-07-18 LAB — CULTURE, AEROBIC

## 2014-07-21 ENCOUNTER — Ambulatory Visit: Admit: 2014-07-21 | Discharge: 2014-07-21 | Payer: PRIVATE HEALTH INSURANCE

## 2014-07-21 ENCOUNTER — Inpatient Hospital Stay: Admit: 2014-07-21 | Payer: PRIVATE HEALTH INSURANCE

## 2014-07-21 DIAGNOSIS — M7021 Olecranon bursitis, right elbow: Secondary | ICD-10-CM

## 2014-07-21 DIAGNOSIS — M25521 Pain in right elbow: Secondary | ICD-10-CM

## 2014-07-21 NOTE — Unmapped (Signed)
[facility]     PATIENT NAME:  Paula Good, Paula Good                 MRN:  [id number]  DATE OF BIRTH:  1965/07/09                       CSN:  [id number]  PROVIDER:  Santiago Glad, M.D.                VISIT DATE:  07/21/2014                                       OFFICE NOTE     The patient comes in today for evaluation and treatment of her right elbow  and her right hand.  She is a former patient of Dr. Lynnda Shields who was seeing her  for her shoulder.  The patient states she has had a history of rheumatoid  arthritis. She states that three years ago she underwent a surgery by Dr.  Arliss Journey, a right elbow surgery by Dr. Arliss Journey.  She states that she has had pain  ever since the surgery.  She states that almost 1-1/2 years ago she developed  an infection in her elbow which required hospitalization and IV antibiotics.  She states that she has had continuous drainage coming from her right elbow  ever since the surgery for which she has required occasional antibiotics.  She also states that she has had rheumatoid arthritis of the hand and it has  caused pain and deformity of the hand.  She has undergone a carpal tunnel  release. She states that she has undergone a carpal tunnel release on the  right hand without much benefit.     The past medical history, surgical history, medications, allergies, family  history, social history and review of systems were documented on the intake  form, signed by Dr. Judie Bonus and will not be redictated today.     PHYSICAL EXAMINATION: A well-developed, well-nourished female in no acute  distress.  Exam of the right elbow reveals no erythema or swelling.  Some  mild drainage is noted from the olecranon.  She has full active range of  motion with flexion, extension.  She does complain of pain over the proximal  forearm.  Valgus and varus stress tests are negative.  Tenderness to  palpation is noted over the  olecranon.  Distally she is neurovascularly  intact.     Examination of the right hand reveals moderate deformity associated with  rheumatoid arthritis.  There is limited range of motion of the fingers due to  deformity.  Distally she is neurovascularly intact.     IMAGING: X-rays taken of the right elbow reveal no evidence of fractures or  osteoarthritis.  The radiologist has noted osteopenia.     IMPRESSION:  1. Chronic olecranon bursitis.  2. Rheumatoid arthritis of the hand.     PLAN: The results of the x-rays and the diagnosis were discussed with the  patient.  We had a long discussion with the patient regarding past surgeries  and treatment.  At this time, she would like to have her right elbow and her  right hand fixed surgically.  We explained that since we are sports medicine  providers that we usually refer rheumatoid arthritis patients to a  specialist.  At this time, we will refer her to Dr. Gaye Pollack for further  evaluation and treatment.  The patient can call the clinic with any questions  or concerns in the meantime.     The patient was seen and examined with Dr. Judie Bonus. He agrees with the stated  findings.     Dictated by Maretta Los, PA.                                              Santiago Glad, M.D.  BRB/tlm  D:  07/21/2014 17:24  T:  07/23/2014 12:46  Job #:  [id number]           OFFICE NOTE                                                  PAGE    1 of   1

## 2014-07-21 NOTE — Unmapped (Signed)
This office note has been dictated.

## 2014-07-22 NOTE — Progress Notes (Signed)
Quick Note:    Left message to call back.  ______

## 2014-07-31 ENCOUNTER — Encounter: Payer: PRIVATE HEALTH INSURANCE | Attending: Hand Surgery

## 2014-08-19 ENCOUNTER — Encounter: Payer: PRIVATE HEALTH INSURANCE | Attending: Hand Surgery

## 2014-08-20 NOTE — Unmapped (Signed)
Called patient to see if she has a copy of her OP Report form previous CTS surgery, left message if she does please bring with her to her appointment , if not she can call [phone] and ask to be connected to me Crista Elliot) at [facility] and I will attempt to get report from where she had it done .

## 2014-08-21 ENCOUNTER — Encounter: Payer: PRIVATE HEALTH INSURANCE | Attending: Hand Surgery

## 2014-08-27 MED ORDER — SUMATRIPTAN SUCCINATE 100 MG PO TABS
100 MG | ORAL_TABLET | ORAL | Status: DC
Start: 2014-08-27 — End: 2014-09-28

## 2014-08-31 MED ORDER — DULOXETINE HCL 60 MG PO CPEP
60 MG | ORAL_CAPSULE | ORAL | Status: DC
Start: 2014-08-31 — End: 2014-09-28

## 2014-09-08 LAB — HEPATIC FUNCTION PANEL
ALT: 24 U/L (ref 10–40)
AST: 23 U/L (ref 15–37)
Albumin: 4.2 g/dL (ref 3.4–5.0)
Alkaline Phosphatase: 81 U/L (ref 40–129)
Bilirubin, Direct: <0.2 mg/dL (ref 0.0–0.3)
Total Bilirubin: 0.3 mg/dL (ref 0.0–1.0)
Total Protein: 7.5 g/dL (ref 6.4–8.2)

## 2014-09-09 LAB — T4, FREE: T4 Free: 1.1 ng/dL (ref 0.9–1.8)

## 2014-09-09 LAB — TSH: TSH: 1.2 u[IU]/mL (ref 0.27–4.20)

## 2014-09-09 LAB — VITAMIN D 25 HYDROXY: Vit D, 25-Hydroxy: 33.7 ng/mL (ref >=30)

## 2014-09-29 MED ORDER — LEVOTHYROXINE SODIUM 25 MCG PO TABS
25 MCG | ORAL_TABLET | ORAL | 0 refills | Status: DC
Start: 2014-09-29 — End: 2014-10-26

## 2014-09-29 MED ORDER — SUMATRIPTAN SUCCINATE 100 MG PO TABS
100 MG | ORAL_TABLET | ORAL | 0 refills | Status: DC
Start: 2014-09-29 — End: 2015-02-20

## 2014-09-29 MED ORDER — DULOXETINE HCL 60 MG PO CPEP
60 MG | ORAL_CAPSULE | ORAL | 2 refills | Status: AC
Start: 2014-09-29 — End: ?

## 2014-10-26 MED ORDER — LEVOTHYROXINE SODIUM 25 MCG PO TABS
25 MCG | ORAL_TABLET | ORAL | 5 refills | Status: DC
Start: 2014-10-26 — End: 2015-04-26

## 2014-10-26 MED ORDER — METOPROLOL TARTRATE 25 MG PO TABS
25 MG | ORAL_TABLET | ORAL | 1 refills | Status: DC
Start: 2014-10-26 — End: 2015-02-01

## 2014-11-03 ENCOUNTER — Encounter: Attending: Physician Assistant

## 2014-11-04 ENCOUNTER — Encounter: Attending: Physician Assistant

## 2014-11-27 MED ORDER — METFORMIN HCL 500 MG PO TABS
500 MG | ORAL_TABLET | ORAL | 0 refills | Status: DC
Start: 2014-11-27 — End: 2015-02-20

## 2014-12-17 ENCOUNTER — Ambulatory Visit: Admit: 2014-12-17 | Payer: BLUE CROSS/BLUE SHIELD

## 2014-12-17 ENCOUNTER — Ambulatory Visit: Admit: 2014-12-17 | Discharge: 2014-12-17 | Payer: BLUE CROSS/BLUE SHIELD | Attending: Podiatrist

## 2014-12-17 DIAGNOSIS — M25571 Pain in right ankle and joints of right foot: Secondary | ICD-10-CM

## 2014-12-17 MED ORDER — PROMETHAZINE HCL 25 MG PO TABS
25 MG | ORAL_TABLET | Freq: Four times a day (QID) | ORAL | 0 refills | Status: AC | PRN
Start: 2014-12-17 — End: 2014-12-24

## 2014-12-17 MED ORDER — HYDROCODONE-ACETAMINOPHEN 5-325 MG PO TABS
5-325 MG | ORAL_TABLET | Freq: Four times a day (QID) | ORAL | 0 refills | Status: AC | PRN
Start: 2014-12-17 — End: 2014-12-24

## 2014-12-17 NOTE — Progress Notes (Signed)
HISTORY OF PRESENT ILLNESS: This is an initial visit for a 49 year old female who presents with severe pain to her right ankle.  Last night she had a 10 pound base fall directly onto her right ankle.  She is not able to sleep very well because of the pain.  Occasionally she will have sharp shooting pain.      FAMILY HISTORY: Documented in chart.    SOCIAL HISTORY: Documented in chart.    REVIEW OF SYSTEMS: Rheumatoid arthritis otherwise The patient denies any issues with dermatologic, pulmonary, cardiovascular, genitourinary, hematologic, gastrointestinal, neurologic, psychiatric, and HEENT systems.      PHYSICAL EXAMINATION:     The patient is alert and orientated x3.      She is mild edema to the medial aspect of the right ankle and heel.  No open lesions or fracture blisters are noted.  She has palpable tenderness at the medial malleolus and the right lower leg.    There is no erythema or ecchymosis present.    She has palpable pedal pulses.  Her sensation is grossly intact.    The remainder of the exam is unremarkable.      RADIOGRAPHS: 3 weightbearing views of the right ankle were taken.  These do not demonstrate any acute fracture or dislocation.  She has moderate to severe osteoarthritic changes within the midfoot.      ASSESSMENT: Bone contusion of the right ankle, rheumatoid arthritis      PLAN: The patient was educated on the pathology and its treatment options.     A high tide walker was applied to the patient's right lower extremity.  The patient was instructed to remove the boot prior to driving.  The boot is to be replaced before the patient if he gets out of the car upon completion of driving.  It was recommended that the patient use the boot even at night for sleeping.  All weightbearing activity is to be performed in the boot with the exception of driving.  Overall activity is to be decreased.    I prescribed Norco for short-term pain management.  Phenergan was also prescribed.    She will monitor  her pain level and if she experiences increased levels then she will let me know.        Procedures   ??? OTS FP Pneumatic Walking Boot Tall DJO     Patient was prescribed a Irena Cords Tall Walking Boot.  The right foot will require stabilization / immobilization from this semi-rigid / rigid orthosis to improve their function.  The orthosis will assist in protecting the affected area, provide functional support and facilitate healing.    The patient was educated and fit by a Designer, fashion/clothing with expert knowledge and specialization in brace application while under the direct supervision of the physician.  Verbal and written instructions for the use of and application of this item were provided.   They were instructed to contact the office immediately should the brace result in increased pain, decreased sensation, increased swelling or worsening of the condition.

## 2014-12-18 NOTE — Telephone Encounter (Signed)
12/18/14  DME  L4361 - NO PRECERT REQUIRED - PER [employer]  NDS

## 2015-01-04 ENCOUNTER — Ambulatory Visit: Admit: 2015-01-04 | Discharge: 2015-01-04 | Payer: BLUE CROSS/BLUE SHIELD | Attending: Hand Surgery

## 2015-01-04 ENCOUNTER — Ambulatory Visit: Admit: 2015-01-04 | Payer: BLUE CROSS/BLUE SHIELD

## 2015-01-04 DIAGNOSIS — M79641 Pain in right hand: Secondary | ICD-10-CM

## 2015-01-04 MED ORDER — PROMETHAZINE HCL 25 MG PO TABS
25 MG | ORAL_TABLET | Freq: Four times a day (QID) | ORAL | 1 refills | Status: AC | PRN
Start: 2015-01-04 — End: 2015-01-11

## 2015-01-04 MED ORDER — HYDROCODONE-ACETAMINOPHEN 5-325 MG PO TABS
5-325 MG | ORAL_TABLET | Freq: Three times a day (TID) | ORAL | 0 refills | Status: AC | PRN
Start: 2015-01-04 — End: 2015-01-11

## 2015-01-04 NOTE — Progress Notes (Signed)
Assessment: Subluxation with extensor hood tears of ring and small fingers on the right hand but there is also significant pathology in the index and middle MP joints in this lady with severe rheumatoid arthritis    Treatment Plan: She is probably going to require MP arthroplasties with recentralization of extensor tendons and crossed intrinsic transfer.  She is very apprehensive about going undergoing joint replacement surgery unless absolutely necessary so today we have casted her in a short arm cast with her PIPs free but holding a reduced position to the index middle ring and small MP joints.  We talked about the possibility of using splints versus casting and she feels like she has been noncompliant with splinting in the past and feels that we should probably try casting.    I've also given her prescription for 30 Norco today.    No Follow-up on file.         History of Present Illness  Rebecca Jenkins is a 49 y.o. female.  Rebecca Jenkins is a lady that I did seen several years ago for a distal radius fracture.  She has severe rheumatoid arthritis and has tried multiple medications but is currently on M Boland methotrexate.  She fell over the weekend and since that time his had significant ulnar deviation deformity and ring and small fingers.  There however is also been increased synovitis in the index and middle MP joints    Review of Systems  Complete Review of Systems performed and is non-contributory except for what is noted in HPI.    Vital Signs  Vitals:    01/04/15 1556   Weight: 145 lb (65.8 kg)   Height: 5' 3.5" (1.613 m)     Body mass index is 25.28 kg/(m^2).     Physical Exam  Constitutional:  Patient is well-nourished and demonstrates normal hygiene.  Mental Status:  Patient is alert and oriented to person, place and time.  Skin:  Intact, no rashes or lesions.    Hand Examination: Her extensor tendons appear intact by tenodesis effect and also when she tries to extend I can palpate the extensor  tendons over the dorsum of the metacarpals.  The MP joints are subluxed volarly as we normally see in severe rheumatoid but the ring and small fingers.  Have extreme complete laxity of the radial collateral ligaments with both resting and passive ulnar deviation.    Additional Comments:     Additional Examinations:  X-Ray Findings:  PA lateral and oblique x-rays of the right hand show subluxation and ulnar deviation of her MP joints.  The index and middle actually have more erosion of the metacarpal heads than the ring and small    PA lateral and oblique x-rays of the right elbow show no evidence of fracture in a normal fat pad sign  Additional Diagnostic Test Findings:    Office Procedures: Short arm cast with reduction of the MP joints was applied today in the office.    Orders Placed This Encounter   Procedures   ??? Hand Right 3V     [id number]     Order Specific Question:   Reason for exam:     Answer:   Hand Pain   ??? Elbow Right 3V     73080     Order Specific Question:   Reason for exam:     Answer:   Elbow Pain   ??? PR CAST SUP SHT ARM ADULT FBRGL   ??? PR APPLY FOREARM CAST

## 2015-01-18 ENCOUNTER — Ambulatory Visit: Admit: 2015-01-18 | Discharge: 2015-01-18 | Payer: BLUE CROSS/BLUE SHIELD | Attending: Hand Surgery

## 2015-01-18 DIAGNOSIS — M0520 Rheumatoid vasculitis with rheumatoid arthritis of unspecified site: Secondary | ICD-10-CM

## 2015-01-18 MED ORDER — PROMETHAZINE HCL 25 MG PO TABS
25 MG | ORAL_TABLET | Freq: Four times a day (QID) | ORAL | 0 refills | Status: AC | PRN
Start: 2015-01-18 — End: 2015-01-25

## 2015-01-18 MED ORDER — OXYCODONE-ACETAMINOPHEN 5-325 MG PO TABS
5-325 MG | ORAL_TABLET | Freq: Three times a day (TID) | ORAL | 0 refills | Status: AC | PRN
Start: 2015-01-18 — End: 2015-01-25

## 2015-01-18 NOTE — Progress Notes (Signed)
Assessment: Cast is loosened just slightly but I still think is giving adequate reduction of the metacarpal phalangeal joint ulnar deviation deformity.  She can create a little space between her middle finger and the ring finger but I don't think this is enough that we need to redo her cast today.  We have trimmed a couple of areas that were bothering her    Treatment Plan: We'll continue to use this cast for the next 2 weeks.  She states she still having a lot of pain in the Norco is not helping so I gave her prescription for Percocet and Phenergan today.  In 2 weeks we'll remove her cast we will tentatively schedule a therapy visit to follow for a removable splint but we may have to change plans and operated on these metacarpal phalangeal joints of ring and small finger.  She does have underlying rheumatoid arthritis  The real question arises whether to do MP arthroplasties on all 4 joints depending on whether she responses conservative treatment.    Controlled substances monitoring: no signs of potential drug abuse or diversion identified.    Return in about 2 weeks (around 02/01/2015) for Therapy appointment to follow.         History of Present Illness  Rebecca Jenkins is a 49 y.o. female.  Patient came in today for a cast check she felt like it was becoming loose    Review of Systems  Complete Review of Systems performed and is non-contributory except for what is noted in HPI.    Vital Signs  There were no vitals filed for this visit.  There is no height or weight on file to calculate BMI.     Physical Exam  Constitutional:  Patient is well-nourished and demonstrates normal hygiene.  Mental Status:  Patient is alert and oriented to person, place and time.  Skin:  Intact, no rashes or lesions.    Hand Examination: Cast still really fits fairly well there is just slight loosening around the fingers themselves but overall I think it's still giving us adequate position    Additional Comments:     Additional  Examinations:  X-Ray Findings:    Additional Diagnostic Test Findings:    Office Procedures:    No orders of the defined types were placed in this encounter.

## 2015-02-01 ENCOUNTER — Inpatient Hospital Stay: Attending: Rehabilitative and Restorative Service Providers"

## 2015-02-01 ENCOUNTER — Ambulatory Visit: Admit: 2015-02-01 | Discharge: 2015-02-01 | Payer: BLUE CROSS/BLUE SHIELD | Attending: Hand Surgery

## 2015-02-01 ENCOUNTER — Ambulatory Visit: Admit: 2015-02-01 | Payer: BLUE CROSS/BLUE SHIELD

## 2015-02-01 ENCOUNTER — Encounter

## 2015-02-01 DIAGNOSIS — M79641 Pain in right hand: Secondary | ICD-10-CM

## 2015-02-01 MED ORDER — VITAMIN D3 50 MCG (2000 UT) PO TABS
50 MCG (2000 UT) | ORAL_TABLET | ORAL | 2 refills | Status: DC
Start: 2015-02-01 — End: 2015-08-19

## 2015-02-01 MED ORDER — METOPROLOL TARTRATE 25 MG PO TABS
25 MG | ORAL_TABLET | ORAL | 0 refills | Status: DC
Start: 2015-02-01 — End: 2015-03-01

## 2015-02-01 NOTE — Progress Notes (Signed)
Assessment: Rheumatoid arthritis with subluxation of the index middle ring and small MP joints and an acute injury to ring and small MP joints with complete instability of radial collateral ligaments and subluxation of extensor tendons.  We removed her cast today and well we've improve the ulnar deviation deformity she still has clinical subluxation of the MP joints and I think is going to require surgical intervention    Treatment Plan: I discussed risks and benefits of MP arthroplasties with crossed intrinsic transfer and re-centralization of extensor tendon mechanisms as well as reconstruction of radial collateral ligaments.  I think we should use silicone implant arthroplasties as I think with her degree of rheumatoid arthritis this is still the best treatment option.  I made sure that she and her husband did understand the incidence of breakage of silicone implants as well as the issues of particulate silicone synovitis.  We also discussed the expected range of motion after surgery and the need for postoperative therapy with dynamic extension bracing    She has had a prior history of developing a methicillin-resistant staph infection in her right elbow 2 years ago after removal of a rheumatoid nodule from her olecranon.  She was treated with IV vancomycin and the infection resolved but she still has intermittent drainage from the elbow wound.  We will treat her prophylactically with vancomycin preoperatively    Return for post op.         History of Present Illness  Rebecca Jenkins is a 49 y.o. female.  Rebecca Jenkins returns in follow-up of her severe rheumatoid arthritis with recent acute injury to ring and small MP joints with severe ulnar deviation deformity    Review of Systems  Complete Review of Systems performed and is non-contributory except for what is noted in HPI.    Vital Signs  There were no vitals filed for this visit.  There is no height or weight on file to calculate BMI.     Physical  Exam  Constitutional:  Patient is well-nourished and demonstrates normal hygiene.  Mental Status:  Patient is alert and oriented to person, place and time.  Skin:  Intact, no rashes or lesions.    Hand Examination: She has definite volar subluxation of the index middle ring and small proximal phalanges on the metacarpal heads with ulnar deviation deformity and compensatory swan-neck deformity at the IP joints.    Additional Comments:     Additional Examinations:  X-Ray Findings:  PA lateral and oblique x-rays of the right hand were repeated today.  There is definite subluxation as well as ulnar deviation deformity of the MP joint.  Additional Diagnostic Test Findings:    Office Procedures: Very extensive preoperative counseling was performed with she and her husband today and informed consent scheduling for surgery was performed.  Office visit was approximately one hour in length total    Orders Placed This Encounter   Procedures   ??? Hand Right 3V     [id number]     Order Specific Question:   Reason for exam:     Answer:   Hand Pain   ??? [facility] - [facility]     Referral Priority:   Routine     Referral Type:   Eval and Treat     Referral Reason:   Specialty Services Required     Requested Specialty:   Occupational Therapy     Number of Visits Requested:   1

## 2015-02-01 NOTE — Plan of Care (Signed)
[facility] ??? Orthopaedics and [place], [place]  ________________________________________________________________________    Patient: Rebecca Jenkins   DOB: 1965-11-20  MRN: [id number]  Referring Physician: Referring Practitioner: Kirke Corin, MD    Evaluation Date: 02/01/2015      Medical Diagnosis Information:  Diagnosis: R hand RA - M06.9           Occupational Therapy Splint Certification Form  Dear Dr. Biagio Borg,  The following patient has been evaluated for occupational therapy services for fabrication of a  R resting hand/MP blocker brace. Insurance requires the referring physician to review the treatment plan. Please review the attached evaluation and/or summary of the patient's plan of care, and verify that you agree by signing the attached document and sending it back to our office.    Plan of Care/Treatment to date:  [x]  Fabrication of custom  R resting hand/MP blocker splint       [x]  Instruction on splint use, care and wearing schedule        [x]  Follow up as needed for splint modifications          Frequency/Duration:  [x]  One time visit for splint fabrication and instructions.  Follow up as needed for splint modifcations        []  Splint fabricated, patient to return for full evaluation.    Rehab Potential: []  good [x]  fair  []  poor         SPLINT EVALUATION  Date: 02/01/2015  Name: Rebecca Jenkins            DOB: February 09, 1966      Medical/Treatment Diagnosis Information:  ?? Diagnosis: R hand RA - C37.6  Insurance/Certification information:  OT Insurance Information: [employer]  Physician Information:  Referring Practitioner: Kirke Corin, MD       Next MD Appointment: surgery 02/09/15    Subjective  History of Injury/ Mechanism of Injury: prolonged history of RA with finger deformities; more recent injury and ulnar deviation of ulnar digits R hand; attempted casting however persistent deviation of digits; surgery scheduled for next week, MD ordering removable MP blocker splint  for positioning of digits  Surgery Date: NA - scheduled for MP arthroplasties on 02/09/15  Dominant Hand:    [x]  Right [] Left  Progress of any previous OT/PT: the patient [] has/ [x] has not received OT/PT previously for this diagnosis.  Pain: 5-6/10    Objective Findings:  Ulnar deviation of IF-SF at MP's   Type of splint:   R resting hand/MP blocker (IF-SF, MP's extended and radially deviated), IP's free  Splint protocol utilization:   Full time  Splint Purpose: [x] Immobilize or protect [x] Promote healing of joints   [x] Relieve pain  [] Provide support for improved hand function [] Maximize joint motion    Treatment:   [x] Splint provided ([x] Customized/ [] Prefabricated), and splint rationale explained.   [x] Patient instructed in [x] wear/ [x] care of splint and educated regarding diagnosis.   [x] Patient instructed in symptom reduction techniques   [] HEP instruction    [] Discussed ADL assistive device    Written Information Distributed: [] HEP  [x] Splint care and wearing protocol    Patient response to evaluation and instructions:  [x] Attentive/interested   [x] Asked questions/ retained info  [] Appeared disinterested  [] Poor retention of information  [] Appeared anxious/ fearful    Assessment and Plan:  Goals: [x] Patient will be able to verbalize rationale for, and demonstrate proper wearing     of splint.   [x] Splint will provide proper fit and function.   [x] Patient will be able to  verbalize 2-3 ways to prevent further symptoms.   _0 Patient will be able to don and doff independently.   _1 Patient will be independent with HEP    Goals met:  _2 yes _3 no    Plan:  _4 Splint completed with good fit and function.  Hand Therapy to follow up for     splint modifications as needed    _5 Splint completed; OT/PT evaluation initiated.  Patient to return for further     treatment.    [place]  OTR/L, PT, MPT, [place], [phone], [phone]

## 2015-02-08 MED ORDER — BUPIVACAINE HCL (PF) 0.5 % IJ SOLN
0.5 % | INTRAMUSCULAR | Status: AC
Start: 2015-02-08 — End: ?

## 2015-02-08 MED FILL — BUPIVACAINE HCL (PF) 0.5 % IJ SOLN: 0.5 % | INTRAMUSCULAR | Qty: 30

## 2015-02-08 NOTE — Patient Instructions (Signed)
Adela LankJacqueline was seen today for pre-op exam.    Diagnoses and all orders for this visit:    Preop examination    Other reactive arthropathies, right hand (HCC)

## 2015-02-08 NOTE — Patient Instructions (Signed)
Patient instructed to:  Bring Picture ID, insurance card, proof of address  Dress Comfortable  No jewelry  No Makeup  Shower evening before or morning of surgery with antibacterial soap.  Nothing to eat or drink after midnight the day before surgery.  If instructed by MD to take meds day of surgery, take with only a sip of water.  If taking am beta blocker, take morning of surgery with sip of water per Dr. Ziegler.

## 2015-02-08 NOTE — Progress Notes (Signed)
I have reviewed the history and physical note and findings.

## 2015-02-08 NOTE — Progress Notes (Signed)
Preoperative Consultation    Rebecca Jenkins  Date of Birth:  [birth year]    This patient presents to the office today for a preoperative consultation at the request of surgeon, Dr. Vernard Gambles, who plans on performing right hand surgery on November 15th at [facility].  She had another surgery on right hand in 2014 and there have been complications to that hand since then.     Planned anesthesia: General   Known anesthesia problems: Usually needs extra anesthesia  Bleeding risk: No recent or remote history of abnormal bleeding  Personal or FH of DVT/PE: No      Patient Active Problem List   Diagnosis   ??? Other chronic nonalcoholic liver disease   ??? Tobacco use disorder   ??? Rheumatoid arthritis (HCC)   ??? Essential hypertension   ??? Esophageal reflux   ??? Anxiety   ??? Hypothyroidism   ??? Osteoporosis   ??? Ventral hernia without obstruction or gangrene   ??? Encounter for cosmetic surgery   ??? Contusion of bone     Past Surgical History   Procedure Laterality Date   ??? Cesarean section       X3   ??? Cholecystectomy  05/03   ??? Liver biopsy       NON ALCOHOLIC CIRRHOSIS   ??? Tubal ligation     ??? Breast enhancement surgery  2001   ??? Back surgery       L1-2   ??? Knee surgery       left   ??? Elbow surgery       left ulnar nerve   ??? Abdominoplasty  01/14/2014     FULL ABDOMINOPLASTY, VENTRAL HERNIA REPAIR , LIPOSUCTION TO       Allergies   Allergen Reactions   ??? Adhesive Tape Hives   ??? Codeine    ??? Other      ORAL PAIN MED ? NAME   ??? Hydrocodone Nausea Only   ??? Oxycodone Nausea Only     Outpatient Prescriptions Marked as Taking for the 02/08/15 encounter (Office Visit) with Ulyses Amor, PA   Medication Sig Dispense Refill   ??? Cholecalciferol (VITAMIN D3) 2000 UNITS TABS TAKE ONE TABLET BY MOUTH DAILY 90 tablet 2   ??? metoprolol tartrate (LOPRESSOR) 25 MG tablet TAKE ONE TABLET BY MOUTH TWICE A DAY 60 tablet 0   ??? metFORMIN (GLUCOPHAGE) 500 MG tablet TAKE ONE TABLET BY MOUTH TWICE A DAY WITH MEALS 60 tablet 0   ???  levothyroxine (SYNTHROID) 25 MCG tablet TAKE ONE TABLET BY MOUTH DAILY 30 tablet 5   ??? DULoxetine (CYMBALTA) 60 MG capsule TAKE ONE CAPSULE BY MOUTH DAILY 30 capsule 2   ??? SUMAtriptan (IMITREX) 100 MG tablet TAKE ONE TABLET BY MOUTH DAILY ONCE AS NEEDED FOR MIGRAINE 9 tablet 0   ??? magnesium 30 MG tablet Take 30 mg by mouth 2 times daily     ??? cephALEXin (KEFLEX) 500 MG capsule Take 1 capsule by mouth 3 times daily 30 capsule 0   ??? amphetamine-dextroamphetamine (ADDERALL) 30 MG tablet Take 30 mg by mouth 2 times daily.     ??? clonazePAM (KLONOPIN) 1 MG tablet Take 1 mg by mouth nightly as needed for Anxiety.     ??? omeprazole (PRILOSEC OTC) 20 MG tablet Take 20 mg by mouth daily.     ??? etanercept (ENBREL) 50 MG/ML injection Inject 25 mg into the skin once. Once a week     ??? METHOTREXATE, ANTI-RHEUMATIC,  PO   Take by mouth 5 tablets once a week     ??? QUEtiapine (SEROQUEL) 300 MG tablet Take 300 mg by mouth every evening          Social History   Substance Use Topics   ??? Smoking status: Current Every Day Smoker     Packs/day: 0.50     Types: Cigarettes   ??? Smokeless tobacco: Never Used      Comment: <1/2 ppd   ??? Alcohol use 0.6 oz/week     1 Standard drinks or equivalent per week      Comment: rarely, usually no more than 2 drinks at a time     Family History   Problem Relation Age of Onset   ??? Hypertension Father    ??? High Blood Pressure Father    ??? Anemia Mother    ??? Heart Disease Paternal Grandmother        Review of Systems  A comprehensive review of systems was negative except for what was noted in the HPI.     Physical Exam   Visit Vitals   ??? BP 130/90   ??? Pulse 80   ??? Temp 97.3 ??F (36.3 ??C)   ??? Ht 5' 3.5" (1.613 m)   ??? Wt 149 lb (67.6 kg)   ??? SpO2 97%   ??? BMI 25.98 kg/m2     Weight: 149 lb (67.6 kg)   Constitutional: She is oriented to person, place, and time. She appears well-developed and well-nourished. No distress.   HENT:   Head: Normocephalic and atraumatic.   Mouth/Throat: Uvula is midline, oropharynx is  clear and moist and mucous membranes are normal.   Eyes: Conjunctivae and EOM are normal. Pupils are equal, round, and reactive to light.   Neck: Trachea normal and normal range of motion. Neck supple. No JVD present. Carotid bruit is not present. No mass and no thyromegaly present.   Cardiovascular: Normal rate, regular rhythm, normal heart sounds and intact distal pulses.  Exam reveals no gallop and no friction rub.  No murmur heard.  Pulmonary/Chest: Effort normal and breath sounds normal. No respiratory distress. She has no wheezes. She has no rales.   Abdominal: Soft. Normal aorta and bowel sounds are normal. She exhibits no distension and no mass. There is no hepatosplenomegaly. No tenderness.   Musculoskeletal: She exhibits no edema and no tenderness.   Neurological: She is alert and oriented to person, place, and time. She has normal strength. No cranial nerve deficit or sensory deficit. Coordination and gait normal.   Skin: Skin is warm and dry. No rash noted. No erythema.     EKG Interpretation:  NA    Lab Review: No       Assessment:       Rebecca Jenkins was seen today for pre-op exam.    Diagnoses and all orders for this visit:    Preop examination    Other reactive arthropathies, right hand (HCC)            Plan:     1. Preoperative workup as follows:   2. Change in medication regimen before surgery:   3. No contraindications to planned surgery  4. Cleared for surgery    Note electronically signed by provider.

## 2015-02-09 LAB — POC PREGNANCY UR-QUAL: Pregnancy, Urine: NEGATIVE

## 2015-02-09 MED ORDER — OXYCODONE-ACETAMINOPHEN 5-325 MG PO TABS
5-325 MG | ORAL | Status: DC | PRN
Start: 2015-02-09 — End: 2015-02-09

## 2015-02-09 MED ORDER — HYDROMORPHONE HCL 1 MG/ML IJ SOLN
1 MG/ML | INTRAMUSCULAR | Status: DC | PRN
Start: 2015-02-09 — End: 2015-02-09

## 2015-02-09 MED ORDER — MEPERIDINE HCL 50 MG/ML IJ SOLN
50 MG/ML | INTRAMUSCULAR | Status: DC | PRN
Start: 2015-02-09 — End: 2015-02-09

## 2015-02-09 MED ORDER — LACTATED RINGERS IV SOLN
INTRAVENOUS | Status: DC
Start: 2015-02-09 — End: 2015-02-10
  Administered 2015-02-09: 14:00:00 via INTRAVENOUS

## 2015-02-09 MED ORDER — ONDANSETRON HCL 4 MG/2ML IJ SOLN
4 MG/2ML | INTRAMUSCULAR | Status: DC | PRN
Start: 2015-02-09 — End: 2015-02-09

## 2015-02-09 MED ORDER — MIDAZOLAM HCL 2 MG/2ML IJ SOLN
2 MG/ML | INTRAMUSCULAR | Status: AC
Start: 2015-02-09 — End: 2015-02-09

## 2015-02-09 MED ORDER — DEXTROSE 5 % IV SOLN (MINI-BAG)
5 % | INTRAVENOUS | Status: AC
Start: 2015-02-09 — End: 2015-02-09
  Administered 2015-02-09: 15:00:00 1000 mg via INTRAVENOUS

## 2015-02-09 MED ORDER — LIDOCAINE HCL (PF) 1 % IJ SOLN
1 % | Freq: Once | INTRAMUSCULAR | Status: AC | PRN
Start: 2015-02-09 — End: 2015-02-09

## 2015-02-09 MED ORDER — HYDRALAZINE HCL 20 MG/ML IJ SOLN
20 MG/ML | INTRAMUSCULAR | Status: DC | PRN
Start: 2015-02-09 — End: 2015-02-09

## 2015-02-09 MED ORDER — LABETALOL HCL 5 MG/ML IV SOLN
5 MG/ML | INTRAVENOUS | Status: DC | PRN
Start: 2015-02-09 — End: 2015-02-09

## 2015-02-09 MED ORDER — BUPIVACAINE-EPINEPHRINE (PF) 0.5% -1:200000 IJ SOLN
INTRAMUSCULAR | Status: AC
Start: 2015-02-09 — End: ?

## 2015-02-09 MED FILL — VANCOMYCIN HCL 1000 MG IV SOLR: 1000 MG | INTRAVENOUS | Qty: 1000

## 2015-02-09 MED FILL — MIDAZOLAM HCL 2 MG/2ML IJ SOLN: 2 MG/ML | INTRAMUSCULAR | Qty: 2

## 2015-02-09 MED FILL — BUPIVACAINE-EPINEPHRINE (PF) 0.5-1:200000 % IJ SOLN: INTRAMUSCULAR | Qty: 60

## 2015-02-09 NOTE — Progress Notes (Signed)
Block Time Out      Verified   Correct Pt.  Correct DOB  Correct Procedure  Correct Site  Correct Extremity

## 2015-02-09 NOTE — Progress Notes (Signed)
Pre and post operative expectations and routines explained to patient, verbalized understanding.

## 2015-02-09 NOTE — Anesthesia Pre-Procedure Evaluation (Signed)
Department of Anesthesiology  Preprocedure Note       Name:  Isa RankinJacqueline A Douglas   Age:  49 y.o.  DOB:  May 12, 1965                                          MRN:  [id number]         Date:  02/09/2015      Surgeon:    Procedure:    Medications prior to admission:   Prior to Admission medications    Medication Sig Start Date End Date Taking? Authorizing Provider   Cholecalciferol (VITAMIN D3) 2000 UNITS TABS TAKE ONE TABLET BY MOUTH DAILY 02/01/15   Lum Keaseresa M Slough O'Brien, MD   metoprolol tartrate (LOPRESSOR) 25 MG tablet TAKE ONE TABLET BY MOUTH TWICE A DAY 02/01/15   Virl Cageyhristopher J Meier, MD   metFORMIN (GLUCOPHAGE) 500 MG tablet TAKE ONE TABLET BY MOUTH TWICE A DAY WITH MEALS 11/27/14   Fanny DanceWilliam J Mauntel, MD   levothyroxine (SYNTHROID) 25 MCG tablet TAKE ONE TABLET BY MOUTH DAILY 10/26/14   Galen Manilaebecca S Koehl, MD   DULoxetine (CYMBALTA) 60 MG capsule TAKE ONE CAPSULE BY MOUTH DAILY 09/29/14   Virl Cageyhristopher J Meier, MD   SUMAtriptan (IMITREX) 100 MG tablet TAKE ONE TABLET BY MOUTH DAILY ONCE AS NEEDED FOR MIGRAINE 09/29/14   Galen Manilaebecca S Koehl, MD   magnesium 30 MG tablet Take 30 mg by mouth 2 times daily    Historical Provider, MD   amphetamine-dextroamphetamine (ADDERALL) 30 MG tablet Take 30 mg by mouth 2 times daily.    Historical Provider, MD   clonazePAM (KLONOPIN) 1 MG tablet Take 1 mg by mouth nightly as needed for Anxiety.    Historical Provider, MD   omeprazole (PRILOSEC OTC) 20 MG tablet Take 20 mg by mouth daily.    Historical Provider, MD   etanercept (ENBREL) 50 MG/ML injection Inject 25 mg into the skin once. Once a week    Historical Provider, MD   METHOTREXATE, ANTI-RHEUMATIC, PO   Take by mouth 5 tablets once a week    Historical Provider, MD   QUEtiapine (SEROQUEL) 300 MG tablet Take 300 mg by mouth every evening     Historical Provider, MD       Current medications:    Current Outpatient Prescriptions   Medication Sig Dispense Refill   ??? Cholecalciferol (VITAMIN D3) 2000 UNITS TABS TAKE ONE TABLET BY MOUTH DAILY 90  tablet 2   ??? metoprolol tartrate (LOPRESSOR) 25 MG tablet TAKE ONE TABLET BY MOUTH TWICE A DAY 60 tablet 0   ??? metFORMIN (GLUCOPHAGE) 500 MG tablet TAKE ONE TABLET BY MOUTH TWICE A DAY WITH MEALS 60 tablet 0   ??? levothyroxine (SYNTHROID) 25 MCG tablet TAKE ONE TABLET BY MOUTH DAILY 30 tablet 5   ??? DULoxetine (CYMBALTA) 60 MG capsule TAKE ONE CAPSULE BY MOUTH DAILY 30 capsule 2   ??? SUMAtriptan (IMITREX) 100 MG tablet TAKE ONE TABLET BY MOUTH DAILY ONCE AS NEEDED FOR MIGRAINE 9 tablet 0   ??? magnesium 30 MG tablet Take 30 mg by mouth 2 times daily     ??? amphetamine-dextroamphetamine (ADDERALL) 30 MG tablet Take 30 mg by mouth 2 times daily.     ??? clonazePAM (KLONOPIN) 1 MG tablet Take 1 mg by mouth nightly as needed for Anxiety.     ??? omeprazole (PRILOSEC OTC) 20 MG tablet  Take 20 mg by mouth daily.     ??? etanercept (ENBREL) 50 MG/ML injection Inject 25 mg into the skin once. Once a week     ??? METHOTREXATE, ANTI-RHEUMATIC, PO   Take by mouth 5 tablets once a week     ??? QUEtiapine (SEROQUEL) 300 MG tablet Take 300 mg by mouth every evening        Current Facility-Administered Medications   Medication Dose Route Frequency Provider Last Rate Last Dose   ??? lactated ringers infusion   Intravenous Continuous Ruthe Mannan, MD       ??? lidocaine PF 1 % (PF) injection 1 mL  1 mL Intradermal Once PRN Ruthe Mannan, MD       ??? midazolam (VERSED) 2 MG/2ML injection            ??? vancomycin 1000 mg IVPB in 250 mL D5W addavial  1,000 mg Intravenous On Call to OR Eugenie Filler, MD           Allergies:    Allergies   Allergen Reactions   ??? Adhesive Tape Hives   ??? Codeine    ??? Other      ORAL PAIN MED ? NAME   ??? Hydrocodone Nausea Only   ??? Oxycodone Nausea Only       Problem List:    Patient Active Problem List   Diagnosis Code   ??? Other chronic nonalcoholic liver disease K76.89   ??? Tobacco use disorder F17.200   ??? Rheumatoid arthritis (HCC) M06.9   ??? Essential hypertension I10   ??? Esophageal reflux K21.9   ??? Anxiety F41.9   ???  Hypothyroidism E03.9   ??? Osteoporosis M81.0   ??? Ventral hernia without obstruction or gangrene K43.9       Past Medical History:        Diagnosis Date   ??? ADHD    ??? Anxiety    ??? Arthritis      RHEUMATOID   ??? Constipation    ??? Fibromyalgia    ??? GERD (gastroesophageal reflux disease)    ??? Hypertension    ??? Irritable bowel syndrome    ??? Other chronic nonalcoholic liver disease    ??? Rheumatoid arthritis(714.0)    ??? Scoliosis    ??? Tobacco use disorder        Past Surgical History:        Procedure Laterality Date   ??? Cesarean section       X3   ??? Cholecystectomy  05/03   ??? Liver biopsy       NON ALCOHOLIC CIRRHOSIS   ??? Tubal ligation     ??? Breast enhancement surgery  2001   ??? Back surgery       L1-2   ??? Knee surgery       left   ??? Elbow surgery       left ulnar nerve   ??? Abdominoplasty  01/14/2014     FULL ABDOMINOPLASTY, VENTRAL HERNIA REPAIR , LIPOSUCTION TO       Social History:    Social History   Substance Use Topics   ??? Smoking status: Current Every Day Smoker     Packs/day: 0.50     Types: Cigarettes   ??? Smokeless tobacco: Never Used      Comment: <1/2 ppd   ??? Alcohol use 0.6 oz/week     1 Standard drinks or equivalent per week      Comment: rarely, usually no  more than 2 drinks at a time                                Ready to quit: Not Answered  Counseling given: Not Answered      Vital Signs (Current): There were no vitals filed for this visit.                                           BP Readings from Last 3 Encounters:   02/08/15 130/90   12/17/14 138/76   07/15/14 130/84       NPO Status:                                                                                 BMI:   Wt Readings from Last 3 Encounters:   02/08/15 149 lb (67.6 kg)   02/08/15 145 lb (65.8 kg)   01/04/15 145 lb (65.8 kg)     There is no height or weight on file to calculate BMI.    Anesthesia Evaluation  Patient summary reviewed no history of anesthetic complications:   Airway: Mallampati: II  TM distance: >3 FB   Neck ROM: full  Mouth  opening: > = 3 FB Dental: normal exam         Pulmonary:negative ROS          Cardiovascular:    (+) hypertension:,                Neuro/Psych:   (+) psychiatric history (ADHD):   GI/Hepatic/Renal:   (+) GERD: well controlled, liver disease:,         Endo/Other:    (+) hypothyroidism: arthritis:.      Abdominal:                     Medications:    Comments:    Prior to Admission medications    Medication Sig Start Date End Date Taking? Authorizing Provider   Cholecalciferol (VITAMIN D3) 2000 UNITS TABS TAKE ONE TABLET BY MOUTH DAILY 02/01/15   Lum Keas, MD   metoprolol tartrate (LOPRESSOR) 25 MG tablet TAKE ONE TABLET BY MOUTH TWICE A DAY 02/01/15   Virl Cagey, MD   metFORMIN (GLUCOPHAGE) 500 MG tablet TAKE ONE TABLET BY MOUTH TWICE A DAY WITH MEALS 11/27/14   Fanny Dance, MD   levothyroxine (SYNTHROID) 25 MCG tablet TAKE ONE TABLET BY MOUTH DAILY 10/26/14   Galen Manila, MD   DULoxetine (CYMBALTA) 60 MG capsule TAKE ONE CAPSULE BY MOUTH DAILY 09/29/14   Virl Cagey, MD   SUMAtriptan (IMITREX) 100 MG tablet TAKE ONE TABLET BY MOUTH DAILY ONCE AS NEEDED FOR MIGRAINE 09/29/14   Galen Manila, MD   magnesium 30 MG tablet Take 30 mg by mouth 2 times daily    Historical Provider, MD   amphetamine-dextroamphetamine (ADDERALL) 30 MG tablet Take 30 mg by mouth 2 times daily.    Historical Provider, MD   clonazePAM Scarlette Calico) 1  MG tablet Take 1 mg by mouth nightly as needed for Anxiety.    Historical Provider, MD   omeprazole (PRILOSEC OTC) 20 MG tablet Take 20 mg by mouth daily.    Historical Provider, MD   etanercept (ENBREL) 50 MG/ML injection Inject 25 mg into the skin once. Once a week    Historical Provider, MD   METHOTREXATE, ANTI-RHEUMATIC, PO   Take by mouth 5 tablets once a week    Historical Provider, MD   QUEtiapine (SEROQUEL) 300 MG tablet Take 300 mg by mouth every evening     Historical Provider, MD       Vitals There were no vitals filed for this visit.  BP Readings from Last  3 Encounters:   02/08/15 130/90   12/17/14 138/76   07/15/14 130/84     BMI  Ht Readings from Last 1 Encounters:   02/08/15 5' 3.5" (1.613 m)     Wt Readings from Last 1 Encounters:   02/08/15 149 lb (67.6 kg)     There is no height or weight on file to calculate BMI.  CBC   Lab Results   Component Value Date    WBC 7.1 08/24/2011    RBC 4.17 08/24/2011    HGB 12.0 08/24/2011    HCT 36.7 08/24/2011    MCV 88.1 08/24/2011    RDW 13.1 08/24/2011    PLT 340 08/24/2011     CMP    Lab Results   Component Value Date    NA 141 06/14/2011    K 3.7 06/14/2011    CL 103 06/14/2011    CO2 27 06/14/2011    BUN 9 06/14/2011    CREATININE 0.78 06/14/2011    GFRAA >60 05/04/2011    AGRATIO 1.3 06/21/2009    LABGLOM 91 06/14/2011    GLUCOSE 96 06/14/2011    PROT 7.5 09/08/2014    PROT 7.5 06/21/2009    CALCIUM 9.4 06/14/2011    BILITOT 0.3 09/08/2014    ALKPHOS 81 09/08/2014    AST 23 09/08/2014    ALT 24 09/08/2014     POCGlucose  No results for input(s): GLUCOSE in the last 72 hours.   Coags    Lab Results   Component Value Date    PROTIME 10.5 08/24/2011    INR 0.92 08/24/2011    APTT 33.6 08/24/2011     HCG (If Applicable)   Lab Results   Component Value Date    PREGTESTUR Negative 01/14/2014      ABGs No results found for: PHART, PO2ART, PCO2ART, HCO3ART, BEART, O2SATART   Type & Screen (If Applicable)  No results found for: LABABO, LABRH        Anesthesia Plan    ASA 3     general and regional   (Axillary block for post op pain.)  intravenous induction   Anesthetic plan and risks discussed with patient.    All questions answered and agrees with plan.        Betsy Coder, MD   02/09/2015

## 2015-02-09 NOTE — Progress Notes (Signed)
Received  from OR and report from OR RN.   Airway patent, with patient taking deep breaths. on command.  Right extremity elevated on pillow above heart level.  Ice bag applies on post hand which is wrapped ace wrap.

## 2015-02-09 NOTE — Progress Notes (Signed)
Responsible caretaker brought to bedside.  Dr.Plettner to bedside talking with caretaker. Caretaker received verbal and written discharge instructions  Included prescriptions for Percocet and phenergan.Marland Kitchen. Received SSI and Medication SE Handout as well. Verbalized understanding of all information received. Denied questions.Discharged ambulating per self/ with assistance by RN.  Staff accompanying and assisted into vehicle. Denied pain upon discharge.  Sling and ice pack given to caretaker with explanation and demonstration on use.

## 2015-02-09 NOTE — Anesthesia Post-Procedure Evaluation (Signed)
Postoperative Anesthesia Note    Name:    Rebecca RankinJacqueline A Rowton  MRN:      [id number]    Patient Vitals for the past 12 hrs:   BP Temp Temp src Pulse Resp SpO2 Height Weight   02/09/15 1224 131/71 97.2 ??F (36.2 ??C) Tympanic 68 16 94 % - -   02/09/15 1219 120/70 - - 66 16 94 % - -   02/09/15 1214 135/74 - - 68 16 95 % - -   02/09/15 1209 (!) 139/94 97 ??F (36.1 ??C) Tympanic 67 14 94 % - -   02/09/15 0935 124/82 97.6 ??F (36.4 ??C) Temporal 74 14 91 % 5\' 3"  (1.6 m) 145 lb (65.8 kg)        LABS:    CBC  Lab Results   Component Value Date/Time    WBC 7.1 08/24/2011 03:16 PM    HGB 12.0 08/24/2011 03:16 PM    HCT 36.7 08/24/2011 03:16 PM    PLT 340 08/24/2011 03:16 PM     RENAL  Lab Results   Component Value Date/Time    NA 141 06/14/2011 11:04 AM    K 3.7 06/14/2011 11:04 AM    CL 103 06/14/2011 11:04 AM    CO2 27 06/14/2011 11:04 AM    BUN 9 06/14/2011 11:04 AM    CREATININE 0.78 06/14/2011 11:04 AM    GLUCOSE 96 06/14/2011 11:04 AM     COAGS  Lab Results   Component Value Date/Time    PROTIME 10.5 08/24/2011 03:16 PM    INR 0.92 08/24/2011 03:16 PM    APTT 33.6 08/24/2011 03:16 PM       Intake & Output:  In: 1700 [I.V.:1700]  Out: -     Nausea & Vomiting:  No    Level of Consciousness:  Awake    Pain Assessment:  Adequate analgesia    Anesthesia Complications:  No apparent anesthetic complications    SUMMARY      Vital signs stable  OK to discharge from Stage I post anesthesia care.  Care transferred from Anesthesiology department on discharge from perioperative area

## 2015-02-09 NOTE — Op Note (Addendum)
[facility]                    [address]  [place], [place]  [id number]                                   OPERATIVE REPORT    PATIENT NAME:  Rebecca Jenkins, Noma A.             DOB:      22-Apr-1965  MED REC NO:    [id number]                       ROOM:        ACCOUNT NO:    [id number]                       ADMISSION DATE: 02/09/2015  PHYSICIAN:     Fayrene FearingJames P. Camira Geidel, MD               DATE OF PROCEDURE:  02/09/2015    PREOPERATIVE DIAGNOSIS:  Rheumatoid arthritis with subluxation and ulnar  deviation deformity of the right index, middle, ring, and small  metacarpophalangeal joints.    POSTOPERATIVE DIAGNOSIS:  Rheumatoid arthritis with subluxation and ulnar  deviation deformity of the right index, middle, ring, and small  metacarpophalangeal joints.    OPERATIVE PROCEDURE PERFORMED:  1.  Silicone MP replacement arthroplasty, right index finger.  2.  Silicone replacement arthroplasty, MP joint, right middle finger.  3.  Silicone replacement arthroplasty, right ring metacarpophalangeal joint.    4.  Silicone replacement arthroplasty, right small metacarpophalangeal joint.  5.  Crossed intrinsic tendon transfer from the ulnar lateral band of the right  index finger to the proximal phalanx right middle finger.  6.  Crossed intrinsic transfer from the ulnar lateral band of the right middle  finger to the proximal phalanx right ring finger.  7.  Crossed intrinsic transfer from the ulnar lateral band of the right ring  finger to the radial aspect of the right small proximal phalanx.    SURGEON:  Illene LabradorJames P. Fabiha Rougeau, M.D.    ANESTHESIA:  General with supraclavicular nerve block.    COMPLICATIONS:  None.    INDICATIONS FOR PROCEDURE:  The patient has severe rheumatoid arthritis and in  spite of multiple medical therapy continues to have progressive synovitis in  her hands.  She presented after minor trauma to her ring and small fingers  with complete ulnar deviation deformity  through the MP joint.  She, however,  had subluxation of all 4 metacarpophalangeal joints and significant ulnar  deviation deformity in the index and middle fingers as well.      DESCRIPTION OF PROCEDURE:  The patient was placed on the operating table in  the supine position.  She had previously been given intravenous vancomycin.   The arm was then prepped with Chloraprep and draped in a sterile fashion.  The  arm was exsanguinated using an Ace bandage and a well-padded tourniquet  inflated about the upper arm to 250 mmHg.  A transverse incision was then made  over the dorsum of the metacarpophalangeal joints.  This was the same incision  that a previous synovectomy had been performed on several years ago.   Dissection was carried down carefully, protecting the large veins in the  interspaces between the metacarpal heads and the extensor tendon mechanisms  were opened on both the radial and ulnar side of each MP joint.  The joint  capsule of each joint was then opened and a full synovectomy was performed.   There was very exuberant synovitis in all 4 joints.  The sagittal saw was then  used to transect the metacarpal heads and then a starter awl was first used  and then the proximal broaches were used.  The index and middle fingers were  able to be broached to a 40 mm size, the ring and small to a 20 mm size.  The  proximal phalanges were then prepared again using the starter awl and then  also using the Swanson bur to resect part of the hard bone of the joint  surface itself and they were broached up to the corresponding sizes of the  metacarpal.  Trial implants were then placed to be sure that there was good  finger range of motion and that the implants would not be too tight.  The  wounds were then copiously irrigated.  The radial collateral ligaments were  identified and on the index finger and partially on the middle finger there  were still reasonable radial collateral ligaments, but on the ring and  small  finger the radial collateral ligament was completely torn loose.  Drill holes  were made in the radial side of the proximal phalanx prior to placing the  implants.  All four implants were placed, one at a time, and a soft tissue  reconstruction was performed individually.  On the index finger after placing  the implant, the radial collateral ligament was reattached using a 4-0  Mersilene through the drill hole.  A suture had been placed in each drill hole  prior to placing the implants.  The finger was taken through its full range of  motion to be sure that the collateral ligament was stable and nonbinding on  the implant.      Attention was then turned to the middle finger.  Again, a drill hole was made  for attachment of both the crossed intrinsic tendon transfer and the  collateral ligament.  The ulnar lateral band of the index finger was  identified, carefully elevated away from the digital neurovascular bundle and  transected out on the level of the mid proximal phalanx.  It was then  transferred over to the middle finger and after the silicone implant was  placed the collateral ligament and the ulnar lateral band intrinsic tendon  transfer were sutured in place using the 4-0 Mersilene transosseous suture.   Again, the finger was taken through its full range of motion.      The procedure was then repeated on the ring and small fingers with the  collateral ligament and the crossed intrinsic tendon transfers attached to the  proximal phalanges.  After ensuring that we had good finger range of motion  and good longitudinal alignment of the digits, the centralization of the  extensor tendon mechanism was performed on each finger using a pants-over-vest  technique of the radial sagittal band, bringing the extensor tendon mechanism  directly dorsal over the implants.  The small finger had to be considerably  released on the ulnar side, as this tendon had been subluxed for quite a  period of time.  All tendons,  however, did have to be released on the ulnar  side to get them centralized properly.  We did not have to release the  abductor digiti quinti muscle insertion onto the small  finger.  After  completing the reconstruction, the fingers were taken through their range of  motion to be sure we had good mobility, and then the tourniquet was deflated.   Hemostasis was achieved using electrocautery, and the wounds were closed with  4-0 nylon horizontal mattress sutures.  A bulk dressing was then applied with  a volar and ulnar gutter splint with fluffs in between the fingers and  dorsally to hold the fingers in good position.      PA, Lateral, and oblique xrays of the right hand were performed that show the MP arthroplasties are in good position and there is good longitudinal alignment.      Seaver Machia P. Jayah Balthazar, MD      D: 02/09/2015 14:50:05  T: 02/09/2015 15:55:28  JPP/nts  Job#: [id number]  Doc#: [id number]    cc:  Fayrene Fearing P. Nia Nathaniel, MD

## 2015-02-09 NOTE — Brief Op Note (Signed)
Brief Postoperative Note    Rebecca Jenkins  Date of Birth:  31-Aug-1965  [id number]    Pre-operative Diagnosis: rheumatoid arthritis with subluxation and ulnar deviation deformity of the Index, Middle , Ring , and Small MP joints    Post-operative Diagnosis: Same    Procedure: Silicone arthroplasty of Index middle ring and small MP joints with centralization of the extensor tendons and crossed intrinsic transfer    Anesthesia: General/ nerve block    Surgeons/Assistants: Katerine Morua    Estimated Blood Loss: 50    Complications: None    Specimens: Was Not Obtained    Findings: as    Electronically signed by Eugenie FillerJAMES P Nayeli Calvert, MD on 02/09/2015 at 2:39 PM

## 2015-02-09 NOTE — Procedures (Signed)
Rebecca Jenkins   03/30/1965  49 y.o.     AXILLARY NERVE BLOCK NOTE    Pt agrees to risks, benefits and alternatives of block.  Surgical procedure: hand  Side: right  Requested per Dr. Vernard GamblesPlettner  Time-out done with :  RN  Pt sedated with Versed 2mg                             Fentanyl 0ug      Propofol 0  Monitor on  Supine  Prepped with chlorhexidine    Yes-lidocaine 1% used for local anesthesia     No-  24 gauge 25 mm Stimuplex needle used   Yes- 22 gauge 50 mm Stimuplex needle used    Initial stimulation of 1.5 ma at 2 HZ decreased to  ma before loss of stimulation.  Good stimulation of radial.    Bupivacaine .50% 20cc injected in 5 cc increments after negative aspiration each time.    Secondary stimulation of 1.5 ma at 2 HZ decreased to  ma before loss of stimulation.  Good stimulation of ulnar.    Bupivacaine .50% 20cc injected in 5 cc increments after negative aspiration each time.      Yes- Epinephrine 1:200,000 included  Pt tolerated procedure well.  No complications.  Comments:  Decadron 10 mg and ultrasound used.    [name] STACY Argel Pablo

## 2015-02-10 MED FILL — PHENYLEPHRINE HCL 10 MG/ML IJ SOLN: 10 MG/ML | INTRAMUSCULAR | Qty: 1

## 2015-02-10 MED FILL — DIPRIVAN 200 MG/20ML IV EMUL: 200 MG/20ML | INTRAVENOUS | Qty: 20

## 2015-02-10 MED FILL — ONDANSETRON HCL 4 MG/2ML IJ SOLN: 4 MG/2ML | INTRAMUSCULAR | Qty: 2

## 2015-02-11 ENCOUNTER — Encounter

## 2015-02-15 ENCOUNTER — Ambulatory Visit: Admit: 2015-02-15 | Payer: BLUE CROSS/BLUE SHIELD

## 2015-02-15 ENCOUNTER — Ambulatory Visit: Admit: 2015-02-15 | Discharge: 2015-02-15 | Payer: BLUE CROSS/BLUE SHIELD | Attending: Hand Surgery

## 2015-02-15 ENCOUNTER — Inpatient Hospital Stay: Attending: Rehabilitative and Restorative Service Providers"

## 2015-02-15 DIAGNOSIS — M05741 Rheumatoid arthritis with rheumatoid factor of right hand without organ or systems involvement: Secondary | ICD-10-CM

## 2015-02-15 MED ORDER — PROMETHAZINE HCL 25 MG PO TABS
25 MG | ORAL_TABLET | Freq: Four times a day (QID) | ORAL | 1 refills | Status: AC | PRN
Start: 2015-02-15 — End: 2015-02-22

## 2015-02-15 MED ORDER — OXYCODONE-ACETAMINOPHEN 5-325 MG PO TABS
5-325 MG | ORAL_TABLET | Freq: Three times a day (TID) | ORAL | 0 refills | Status: AC | PRN
Start: 2015-02-15 — End: 2015-02-22

## 2015-02-15 NOTE — Progress Notes (Signed)
Assessment: Excellent longitudinal alignment after MP arthroplasties with crossed intrinsic transfer and collateral ligament reattachment's of the right hand    Treatment Plan: Rhythm and using dynamic extension splinting Rebecca Jenkins has fitted her with an extension and flexion splint as well as the ability to use the hand-based splint for wrist range of motion.  We will bring her back next week for suture removal    No Follow-up on file.         History of Present Illness  Rebecca Jenkins is a 49 y.o. female.  1st postop visit    Review of Systems  Complete Review of Systems performed and is non-contributory except for what is noted in HPI.    Vital Signs  There were no vitals filed for this visit.  There is no height or weight on file to calculate BMI.     Physical Exam  Constitutional:  Patient is well-nourished and demonstrates normal hygiene.  Mental Status:  Patient is alert and oriented to person, place and time.  Skin:  Intact, no rashes or lesions.    Hand Examination: Wounds are healing nicely without evidence of infection.    Additional Comments:     Additional Examinations:  X-Ray Findings:  PA lateral oblique x-rays of the right hand show excellent longitudinal alignment of the MP arthroplasties  Additional Diagnostic Test Findings:    Office Procedures:    Orders Placed This Encounter   Procedures   ??? Hand Right 3V     [id number]     Order Specific Question:   Reason for exam:     Answer:   Hand Pain

## 2015-02-15 NOTE — Plan of Care (Signed)
[facility], [facility]  [address], [address] [place], [place] [address]  Phone: [phone] Fax: [phone]      Hand Therapy Certification  Dear   Dr. Vernard Gambles     We had the pleasure of evaluating the following patient for occupational therapy services at [facility].  A summary of our findings can be found in the initial assessment below.  This includes our plan of care.  If you have any questions or concerns regarding these findings, please do not hesitate to contact me at the office phone number checked above.  Thank you for the referral.     Physician Signature:_______________________________Date:__________________  By signing above (or electronic signature), therapist???s plan is approved by physician      Patient: Rebecca Jenkins   DOB: [birth year]   MRN: [id number]  Referring Physician: Referring Practitioner: Blima Rich, MD      Evaluation Date: 02/15/2015      Medical Diagnosis Information:  Diagnosis: M05.741 (ICD-10-CM) - Rheumatoid arthritis involving right hand with positive rheumatoid factor (HCC), s/p MP arthroplasties IF-SF (DOS: 02/09/15)                                             Insurance information: OT Insurance Information: Investment banker, corporate Contra-indications: MP arthroplasty protocol - 2 splint program alternating day/night; graded static progression of MP flexion in exercise splint   Latex Allergy:  [x] NO      [] YES  PACEMAKER:  [x]  No       []  Yes     Preferred Language for Healthcare:   [x] English       [] other:    SUBJECTIVE: several year history of instability and ulnar drift of R hand (IF-SF)    Relevant Medical History:stroke/TIA, anxiety/depression, fibromyalgia, RA  Functional Disability Index: Quick DASH: 98%    G-Code:         The patient demonstrated 98% impairment, limitation or restriction in carrying, moving and handling objects.    Quick DASH Total Score:    10 responses / 11 responses  Disability Index CMS Modifier   10 / 11  0% [] CH   11-17/12-19 1-19% [] CI   18-25/20-28 20-39% [] CJ   26-33/29-37 40-59% [] CK   34-41/38-45 60-79% [] CL   42-49/46-54 80-99% [x] CM   50/55 100% [] CN     Pain Location:  R hand    Pain Scale: 8/10    Easing factors: rest    Provocative factors: activities     Type: [x] Constant   [] Intermittent  [] Radiating [] Localized [] other:     Functional Limitations/Impairments:   [x] Lifting/reaching   [x] Grooming   [x] Carrying     [x] ADL's   [x] Driving   [x] Work/Sport/Recreation      [] Other:    Occupation/School: unemployed  Recreational Activities: stretching    Living Status/Prior Level of Function: Independent with ADLs and IADLs, lives with husband    OBJECTIVE:     Hand Dominance:   [x]   Right    []  Left    Objective Measures: * ROM to be formally assessed on next visit     Involved    AROM: Right Left   Digits IF-SF MP's grossly 20 ext/40 flex    IF-SF IP's ~ half of full AROM     Wrist Ext/Flex [facility]  Edema:      Significant edema over dorsal MP's IF-SF              Strength: Contraindicated         Sensation:   [x]  No reported deficits  []  Semmes Weinstein test completed, findings as noted:  []  Other:    Joint mobility:   [] Normal   [x] Hypo  [] Hyper    Palpation:     Functional Mobility/Transfers/Gait:   [x]  Independent - no significant gait deviations  []  Assistance needed   []  Assistive device used:     Posture:   [x]  WNL  []  Other:    Bandages/Dressings/Incisions:    [x]  Comments: post-op dressings removed; sutures intact over dorsal IF-SF MP's; significant dorsal hand and palmar edema noted  []  Not applicable                      [x]  Patient history, allergies, meds reviewed. Medical chart reviewed. See intake form.     Review Of Systems (ROS):  [x] Performed Review of systems (Integumentary, CardioPulmonary, Neurological) by intake and observation. Intake form has been scanned into medical record. Patient has been instructed to contact their primary care physician  regarding ROS issues if not already being addressed at this time.      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  []  Falls Risk assessed and Patient requires intervention due to being higher risk   TUG score (>12s at risk):     []  Falls education provided, including      ASSESSMENT:   This patient presents with signs and symptoms consistent with the medical diagnosis provided by the referring physician.     Functional Deficits/Impairments:  [x]  Loss of range of motion   [x]  Loss of strength    []  Hypersensitivity    [x]  Tenderness to palpation    [x]  Edema/swelling    [x]  Pain    []  Impaired posture/body mechanics  []  Sensory loss  [x]  Decreased functional performance      The patient demonstrates reduced performance level in:  [x]  Self care activities.  [x]  Home management activities.  [x]  Work, sport or recreational activities.    Rehab Potential:   []  Excellent [x]  Good []  Fair  []  Poor     Factors [] Positively/[x] Negatively affecting rehab potential:    [] Age    [] Motivation/Lack of Motivation     [x] Co-Morbidities    [] Cognitive Function    [] Environmental, home, learning, work barriers    Tolerance of evaluation/treatment:    [] Excellent   [x] Good    [x] Fair   [] Poor    Plan of Care:  INTERVENTIONS:  [x]  Therapeutic Exercise   [x]  Therapeutic Activity    [x]  Activities of Daily Living   [x]  Neuromuscular Re-education      [x]  Instruction in HEP (COPY SCANNED IN CHART )    [x]  Manual Therapy         [x]  Modalities:  []  Ultrasound  []  Paraffin  [x]  Moist heat/cold pack  [x]  Electrical Stimulation  []  Contrast Bath  []  Iontophoresis    Other:  [x]  ?Splinting: L-code: [id number] x 2     Frequency/Duration:  # Days per week: []  1 day # Weeks: []   1 week  5  weeks       2 days?    2 weeks  6 weeks      3 days    3 weeks 8 weeks      4 days    4 weeks  8-12 weeks    GOALS:  Short Term Goals: To be achieved in: 2 weeks  1. Independent in HEP and progression per patient tolerance, in order to prevent re-injury.   2. Patient will have a decrease in pain to facilitate improvement in movement, function, and ADLs as indicated by Functional Deficits.    Long Term Goals to be achieved in 8-12  weeks:  1) Pt to be independent in graded HEP progression with a good level of effort and compliance.  2) Pt to report a Quick DASH score of 35 % or less on the Quick DASH disability questionnaire for increased performance with carrying, moving, and handling objects.  3) Pt will demonstrate increased ROM to IF-SF MP arc of motion >/= 40 degrees for improved functional performance.  4) Pt will demonstrate increased strength to R grip strength to >/= 50% of L for improved functional performance.  5) Pt will have a decrease in pain to 2-3/10 to facilitate improvement in strength, movement, function, and ADLs.  6) Patient stated goal: use of R hand for functional activities         Electronically signed by:  Etty Isaac OTR/L, PT, MPT, CHT, [phone], [phone]

## 2015-02-15 NOTE — Other (Addendum)
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 (DOS: 02/09/15)                                             Insurance information: OT Insurance Information: BCBS      Visit # Insurance Allowable   1 60     Date of Patient follow up with Physician: 1 weeks    G-Code:  Applied on 02/15/2015      Functional Assessment Tool:  Quick DASH  Score: 98%  Functional Limitation: carrying, moving and handling objects  Current Status (U7253):   CH/0%   CI/1-19%   CJ/20-39%    CK/40-59%     CL/60-79%       CM/80-99%      CN/100%   impaired, limited, or restricted    Goal Status (G6440):    CH/0%   CI/1-19%   CJ/20-39%    CK/40-59%      CL/60-79%       CM/80-99%      CN/100%   impaired, limited, or restricted      Progress Note:   Yes    No  Next due by: Visit #10      Latex Allergy:  NO      YES    Preferred Language for Healthcare:   English       other:    Pain level:  8/10     SUBJECTIVE:  See eval    RESTRICTIONS/PRECAUTIONS: gentle progression of MP flexion via progressive positioning in day/exercise splint     OBJECTIVE:          Date:  02/15/2015       Objective Measures:         See eval                       Modalities:         -                       Therapeutic Exercise & Activities:        Incision care Cleaned with peroxide; dressed with gauze wrap, stockinette       AROM IPs x 10x2, wrist ext/flex x 10x2    MP ext/flex via transition between 2 splints       HEP Instructed on splint program, HEP -  handout provided                         Therapeutic Exercise and NMR EXR   (97110) Provided verbal/tactile cueing for activities related to strengthening, flexibility, endurance, ROM  for improvements in scapular, scapulothoracic and UE control with self care, reaching, carrying, lifting, house/yardwork, driving/computer work.     (34742) Provided verbal/tactile cueing for activities related to improving balance, coordination, kinesthetic sense, posture, motor skill, proprioception  to  assist with  scapular, scapulothoracic and UE control with self care, reaching, carrying, lifting, house/yardwork, driving/computer work.    Therapeutic Activities:    []  [phone](97112 or [phone]) Provided verbal/tactile cueing for activities related to improving balance, coordination, kinesthetic sense, posture, motor skill, proprioception and motor activation to allow for proper function of scapular, scapulothoracic and UE control with self care, carrying, lifting, driving/computer work.     Home Exercise Program:    [x]  [phone](97110) Reviewed/Progressed HEP activities related to strengthening, flexibility, endurance, ROM of scapular, scapulothoracic and UE control with self care, reaching, carrying, lifting, house/yardwork, driving/computer work  []  (91478(97112) Reviewed/Progressed HEP activities related to improving balance, coordination, kinesthetic sense, posture, motor skill, proprioception of scapular, scapulothoracic and UE control with self care, reaching, carrying, lifting, house/yardwork, driving/computer work      Manual Treatments:  PROM / STM / Oscillations-Mobs:  G-I, II, III, IV (PA's, Inf., Post.)  []  (97140) Provided manual therapy to mobilize soft tissue/joints of cervical/CT, scapular GHJ and UE for the purpose of modulating pain, promoting relaxation,  increasing ROM, reducing/eliminating soft tissue swelling/inflammation/restriction, improving soft tissue extensibility and allowing for proper ROM for normal function with self  care, reaching, carrying, lifting, house/yardwork, driving/computer work    Splinting:  [x]  Fabrication of: day/exercise splint (MP blocker with removable volar plate), night/resting splint - L3919 x 2  []  (29562(97762) Checkout for orthotic/prosthetic use, established patient   []  (13086(97760) Orthotic management and training (fitting and assessment)  []  Comments:    Charges:  Timed Code Treatment Minutes: 15'   Total Treatment Minutes: 120'     [x]  OT Eval ([id number])   []  OT Re-eval ([id number])   [x]  TEx (97110) x      []  MWUXL(24401onto(97033)  []  NMR (97112) x      []  Estim (attended) (02725(97032)   []  Manual (97140) x       []  US (36644(97035)  []  TA (97530) x      []  Paraffin (03474(97018)  []  ADL  (97535) x     [x]  Splint/Lcode: L3919 x 2 (resting hand splints - day/exercise splint, night/rest splint)   []  Estim (unattended) (97014)  []  Other:    GOALS:  Short Term Goals: To be achieved in: 2 weeks  1. Independent in HEP and progression per patient tolerance, in order to prevent re-injury.   2. Patient will have a decrease in pain to facilitate improvement in movement, function, and ADLs as indicated by Functional Deficits.    Long Term Goals to be achieved in 8-12  weeks:  1) Pt to be independent in graded HEP progression with a good level of effort and compliance.  2) Pt to report a Quick DASH score of 35 % or less on the Quick DASH disability questionnaire for increased performance with carrying, moving, and handling objects.  3) Pt will demonstrate increased ROM to IF-SF MP arc of motion >/= 40 degrees for improved functional performance.  4) Pt will demonstrate increased strength to R grip strength to >/= 50% of L for improved functional performance.  5) Pt will have a decrease in pain to 2-3/10 to facilitate improvement in strength, movement, function, and ADLs.  6) Patient stated goal: use of R hand for functional activities      Progression Towards Functional goals:  []  Patient is progressing as expected towards functional goals listed.    []   Progression is slowed due to complexities listed.  []  Progression has been slowed due to co-morbidities.  [x]  Plan just implemented,  too soon to assess goals progression  []  Other:     ASSESSMENT:  See eval    Treatment/Activity Tolerance:  [x]  Patient tolerated treatment well []  Patient limited by fatique  []  Patient limited by pain  []  Patient limited by other medical complications  []  Other:     Prognosis: [x]  Good []  Fair  []  Poor    Patient Requires Follow-up: [x]  Yes  []  No    PLAN: See eval  []  Continue per plan of care []  Alter current plan (see comments)  [x]  Plan of care initiated []  Hold pending MD visit []  Discharge    Electronically signed by: Garry Bochicchio QuiambaoOTR/L, PT, MPT, CHT, [id number], [id number]

## 2015-02-17 ENCOUNTER — Inpatient Hospital Stay: Attending: Rehabilitative and Restorative Service Providers"

## 2015-02-17 NOTE — Other (Addendum)
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 (DOS: 02/09/15)                                             Insurance information: OT Insurance Information: BCBS      Visit # Insurance Allowable   2 60     Date of Patient follow up with Physician: 1 week    G-Code:  Applied on 02/17/2015      Functional Assessment Tool:  Quick DASH  Score: 98%  Functional Limitation: carrying, moving and handling objects  Current Status (Y3016): []   CH/0% []   CI/1-19% []   CJ/20-39%  []   CK/40-59%   []   CL/60-79%     [x]   CM/80-99%    []   CN/100%   impaired, limited, or restricted    Goal Status (W1093):  []   CH/0% []   CI/1-19% [x]   CJ/20-39%  []   CK/40-59%    []   CL/60-79%     []   CM/80-99%    []   CN/100%   impaired, limited, or restricted      Progress Note: []   Yes  [x]   No  Next due by: Visit #10      Latex Allergy:  [x] NO      [] YES    Preferred Language for Healthcare:   [x] English       [] other:    Pain level:  8/10     SUBJECTIVE:  See eval    RESTRICTIONS/PRECAUTIONS: gentle progression of MP flexion via progressive positioning in day/exercise splint     OBJECTIVE:          Date:  02/15/2015 02/17/15      Objective Measures:        IF MP  PIP  DIP    LF MP  PIP  DIP    RF MP  PIP  DIP    SF MP  PIP  DIP   See eval 45/60  22HE/43  8/45    45/65  28HE/43  0/45    35/60  23HE/67  0/60    30/55  12HE/55  0/55                      Modalities:         - -                      Therapeutic Exercise & Activities:        Incision care Cleaned with peroxide;  dressed with gauze wrap, stockinette As prior - small area of increased redness at 2 sutures over dorsal LF MP - MD asst notified      AROM IPs x 10x2, wrist ext/flex x 10x2    MP ext/flex via transition between 2 splints IP's 10x 4    MP's x10x2 within contolled range (ext down to exercise splint) with support for  allignment     Wrist AROM 10x3      HEP Instructed on splint program, HEP - handout provided Reviewed HEP, splint schedule                        Therapeutic Exercise and NMR EXR  [x]  (97110) Provided verbal/tactile cueing for activities related to strengthening, flexibility, endurance, ROM  for improvements in scapular, scapulothoracic and UE control with self care, reaching, carrying, lifting, house/yardwork, driving/computer work.    [x]  [phone](97112) Provided verbal/tactile cueing for activities related to improving balance, coordination, kinesthetic sense, posture, motor skill, proprioception  to assist with  scapular, scapulothoracic and UE control with self care, reaching, carrying, lifting, house/yardwork, driving/computer work.    Therapeutic Activities:    []  [phone](97112 or [phone]) Provided verbal/tactile cueing for activities related to improving balance, coordination, kinesthetic sense, posture, motor skill, proprioception and motor activation to allow for proper function of scapular, scapulothoracic and UE control with self care, carrying, lifting, driving/computer work.     Home Exercise Program:    [x]  [phone](97110) Reviewed/Progressed HEP activities related to strengthening, flexibility, endurance, ROM of scapular, scapulothoracic and UE control with self care, reaching, carrying, lifting, house/yardwork, driving/computer work  []  (29528(97112) Reviewed/Progressed HEP activities related to improving balance, coordination, kinesthetic sense, posture, motor skill, proprioception of scapular, scapulothoracic and UE control with self care, reaching, carrying, lifting, house/yardwork, driving/computer work      Manual  Treatments:  PROM / STM / Oscillations-Mobs:  G-I, II, III, IV (PA's, Inf., Post.)  [x]  (97140) Provided manual therapy to mobilize soft tissue/joints of cervical/CT, scapular GHJ and UE for the purpose of modulating pain, promoting relaxation,  increasing ROM, reducing/eliminating soft tissue swelling/inflammation/restriction, improving soft tissue extensibility and allowing for proper ROM for normal function with self care, reaching, carrying, lifting, house/yardwork, driving/computer work    Splinting:  [x]  Fabrication of: figure of 8 splints applied to R LF & RF PIPs due to increased [place] deformities noted (28 & 23 degrees HE noted respectively) - decreased pain reported with application of splints  []  (41324(97762) Checkout for orthotic/prosthetic use, established patient   []  (40102(97760) Orthotic management and training (fitting and assessment)  [x]  Comments: good fit noted of daytime exercise splint    Charges:  Timed Code Treatment Minutes: 55   Total Treatment Minutes: 68     []  OT Eval ([id number])   []  OT Re-eval (6440397004)   [x]  TEx (97110) x  2   []  KVQQV(95638onto(97033)  [x]  NMR (97112) x  1   []  Estim (attended) (75643(97032)   []  Manual (97140) x       []  US (32951(97035)  []  TA (97530) x      []  Paraffin (88416(97018)  []  ADL  (97535) x     []  Splint/Lcode: splint, night/rest splint)   []  Estim (unattended) (97014)  [x]  Other: Splint fabrication x 1    GOALS:  Short Term Goals: To be achieved in: 2 weeks  1. Independent in HEP and progression per patient tolerance, in order to prevent re-injury.   2. Patient will have a decrease in pain to facilitate improvement in movement, function, and ADLs as indicated by Functional Deficits.    Long Term Goals to be achieved in 8-12  weeks:  1) Pt to be independent in graded HEP progression with a good level of effort and compliance.  2) Pt to report a Quick DASH score of 35 % or less on the Quick DASH  disability questionnaire for increased performance with carrying, moving, and handling objects.  3)  Pt will demonstrate increased ROM to IF-SF MP arc of motion >/= 40 degrees for improved functional performance.  4) Pt will demonstrate increased strength to R grip strength to >/= 50% of L for improved functional performance.  5) Pt will have a decrease in pain to 2-3/10 to facilitate improvement in strength, movement, function, and ADLs.  6) Patient stated goal: use of R hand for functional activities      Progression Towards Functional goals:   Patient is progressing as expected towards functional goals listed.     Progression is slowed due to complexities listed.   Progression has been slowed due to co-morbidities.   Plan just implemented, too soon to assess goals progression   Other:     ASSESSMENT:    Treatment/Activity Tolerance:   Patient tolerated treatment well  Patient limited by fatique   Patient limited by pain   Patient limited by other medical complications   Other:     Prognosis:  Good  Fair   Poor    Patient Requires Follow-up:  Yes   No    PLAN:    Continue per plan of care  Alter current plan (see comments)   Plan of care initiated  Hold pending MD visit  Discharge    Electronically signed by: Lizann Edelman QuiambaoOTR/L, PT, MPT, CHT, [id number], [id number]

## 2015-02-22 MED ORDER — METFORMIN HCL 500 MG PO TABS
500 MG | ORAL_TABLET | ORAL | 0 refills | Status: DC
Start: 2015-02-22 — End: 2015-03-22

## 2015-02-22 MED ORDER — SUMATRIPTAN SUCCINATE 100 MG PO TABS
100 MG | ORAL_TABLET | ORAL | 0 refills | Status: AC
Start: 2015-02-22 — End: ?

## 2015-02-23 ENCOUNTER — Inpatient Hospital Stay: Attending: Rehabilitative and Restorative Service Providers"

## 2015-02-23 NOTE — Progress Notes (Signed)
[facility]?? [facility], [place]    Occupational Therapy  Cancellation/No-show Note  Patient Name:  Rebecca Jenkins   DOB:  09/02/1965   Date:  02/23/2015  Cancelled visits to date: 1  No-shows to date: 0    For today's appointment patient:  [x]     Cancelled  []     Rescheduled appointment  []     No-show     Reason given by patient:  [x]     Patient ill  []     Conflicting appointment  []     No transportation    []     Conflict with work  []     No reason given  []     Other:     Comments:      Electronically signed by:  Seraiah Nowack OTR/L, PT, CHT, [phone], [phone]

## 2015-02-25 ENCOUNTER — Inpatient Hospital Stay: Attending: Rehabilitative and Restorative Service Providers"

## 2015-02-25 NOTE — Other (Signed)
[facility], [place]  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 (DOS: 02/09/15)                                             Insurance information: OT Insurance Information: BCBS      Visit # Insurance Allowable   3 60     Date of Patient follow up with Physician: 1 week    G-Code:  Applied on 02/25/2015      Functional Assessment Tool:  Quick DASH  Score: 98%  Functional Limitation: carrying, moving and handling objects  Current Status (N6295(G8984): []   CH/0% []   CI/1-19% []   CJ/20-39%  []   CK/40-59%   []   CL/60-79%     [x]   CM/80-99%    []   CN/100%   impaired, limited, or restricted    Goal Status (M8413(G8985):  []   CH/0% []   CI/1-19% [x]   CJ/20-39%  []   CK/40-59%    []   CL/60-79%     []   CM/80-99%    []   CN/100%   impaired, limited, or restricted      Progress Note: []   Yes  [x]   No  Next due by: Visit #10      Latex Allergy:  [x] NO      [] YES    Preferred Language for Healthcare:   [x] English       [] other:    Pain level:  3-5/10     SUBJECTIVE:  Reports improvement in pain at PIP's with use of figure of eight splints, however intermittent swelling into digits limits full time wear; missed Tuesday appt due to illness; concerned about leaving sutures in until Monday; c/o weakness of fingers    RESTRICTIONS/PRECAUTIONS: gentle progression of MP flexion via progressive positioning in day/exercise splint    - spoke with MD assistant regarding status of sutures, approval given to remove on this date    OBJECTIVE:          Date:  02/15/2015 02/17/15 02/25/15     Objective  Measures:        IF MP  PIP  DIP    LF MP  PIP  DIP    RF MP  PIP  DIP    SF MP  PIP  DIP   See eval 45/60  22HE/43  8/45    45/65  28HE/43  0/45    35/60  23HE/67  0/60    30/55  12HE/55  0/55 30/75  0/50  0/45    30/80  22/45  0/45    28/75  15/60  0/65    15/60  0/45  0/55                     Modalities:         - - -  Therapeutic Exercise & Activities:        Incision care Cleaned with peroxide; dressed with gauze wrap, stockinette As prior - small area of increased redness at 2 sutures over dorsal LF MP - MD asst notified Incision cleaned, sutures removed - good healing noted     AROM IPs x 10x2, wrist ext/flex x 10x2    MP ext/flex via transition between 2 splints IP's 10x 4    MP's x10x2 within contolled range (ext down to exercise splint) with support for allignment     Wrist AROM 10x3 IP's 10x2    MP's 10x2 as prior    Gentle, supported active ext/flex x 10    Digital adduction x 10     Wrist ext/flex 10x2      HEP Instructed on splint program, HEP - handout provided Reviewed HEP, splint schedule Reviewed HEP, splints               Therapeutic Exercise and NMR EXR   (97110) Provided verbal/tactile cueing for activities related to strengthening, flexibility, endurance, ROM  for improvements in scapular, scapulothoracic and UE control with self care, reaching, carrying, lifting, house/yardwork, driving/computer work.     [phone]) Provided verbal/tactile cueing for activities related to improving balance, coordination, kinesthetic sense, posture, motor skill, proprioception  to assist with  scapular, scapulothoracic and UE control with self care, reaching, carrying, lifting, house/yardwork, driving/computer work.    Therapeutic Activities:     [phone] or [phone]) Provided verbal/tactile cueing for activities related to improving balance, coordination, kinesthetic sense, posture, motor skill, proprioception and motor activation to allow for proper function of scapular, scapulothoracic and  UE control with self care, carrying, lifting, driving/computer work.     Home Exercise Program:     [phone]) Reviewed/Progressed HEP activities related to strengthening, flexibility, endurance, ROM of scapular, scapulothoracic and UE control with self care, reaching, carrying, lifting, house/yardwork, driving/computer work   (29562) Reviewed/Progressed HEP activities related to improving balance, coordination, kinesthetic sense, posture, motor skill, proprioception of scapular, scapulothoracic and UE control with self care, reaching, carrying, lifting, house/yardwork, driving/computer work      Manual Treatments:  PROM / STM / Oscillations-Mobs:  G-I, II, III, IV (PA's, Inf., Post.)   (97140) Provided manual therapy to mobilize soft tissue/joints of cervical/CT, scapular GHJ and UE for the purpose of modulating pain, promoting relaxation,  increasing ROM, reducing/eliminating soft tissue swelling/inflammation/restriction, improving soft tissue extensibility and allowing for proper ROM for normal function with self care, reaching, carrying, lifting, house/yardwork, driving/computer work  - issued surgitube sleeves for IF-SF for edema control, size D compression sleeve hand/FA/wrist    Splinting:   Fabrication of:    (13086) Checkout for orthotic/prosthetic use, established patient    (57846) Orthotic management and training (fitting and assessment)   Comments: modified day splint for optimal fit    Charges:  Timed Code Treatment Minutes: 38   Total Treatment Minutes: 50      OT Eval (96295)    OT Re-eval (28413)    TEx (97110) x  2    KGMWN(02725)   NMR (97112) x  1    Estim (attended) (36644)    Manual (97140) x        [place] (03474)   TA (97530) x       Paraffin (25956)   ADL  (97535) x      Splint/Lcode: splint, night/rest splint)    Estim (unattended) (97014)   Other:     GOALS:  Short Term  Goals: To be achieved in: 2 weeks  1. Independent in HEP and progression per  patient tolerance, in order to prevent re-injury.   2. Patient will have a decrease in pain to facilitate improvement in movement, function, and ADLs as indicated by Functional Deficits.    Long Term Goals to be achieved in 8-12  weeks:  1) Pt to be independent in graded HEP progression with a good level of effort and compliance.  2) Pt to report a Quick DASH score of 35 % or less on the Quick DASH disability questionnaire for increased performance with carrying, moving, and handling objects.  3) Pt will demonstrate increased ROM to IF-SF MP arc of motion >/= 40 degrees for improved functional performance.  4) Pt will demonstrate increased strength to R grip strength to >/= 50% of L for improved functional performance.  5) Pt will have a decrease in pain to 2-3/10 to facilitate improvement in strength, movement, function, and ADLs.  6) Patient stated goal: use of R hand for functional activities      Progression Towards Functional goals:   Patient is progressing as expected towards functional goals listed.     Progression is slowed due to complexities listed.   Progression has been slowed due to co-morbidities.   Plan just implemented, too soon to assess goals progression   Other:     ASSESSMENT:    Treatment/Activity Tolerance:   Patient tolerated treatment well  Patient limited by fatique   Patient limited by pain   Patient limited by other medical complications   Other: swelling continues to decrease, ROM increasing; pt appears to be progressing nicely with program    Prognosis:  Good  Fair   Poor    Patient Requires Follow-up:  Yes   No    PLAN:    Continue per plan of care  Alter current plan (see comments)   Plan of care initiated  Hold pending MD visit  Discharge    Electronically signed by: Donae Kueker OTR/L, PT, MPT, CHT, [phone], [phone]

## 2015-03-01 ENCOUNTER — Ambulatory Visit: Admit: 2015-03-01 | Discharge: 2015-03-01 | Payer: BLUE CROSS/BLUE SHIELD | Attending: Hand Surgery

## 2015-03-01 ENCOUNTER — Encounter: Admit: 2015-03-01 | Payer: BLUE CROSS/BLUE SHIELD

## 2015-03-01 ENCOUNTER — Inpatient Hospital Stay: Attending: Rehabilitative and Restorative Service Providers"

## 2015-03-01 DIAGNOSIS — 1 ERRONEOUS ENCOUNTER ICD10: Secondary | ICD-10-CM

## 2015-03-01 DIAGNOSIS — M05741 Rheumatoid arthritis with rheumatoid factor of right hand without organ or systems involvement: Secondary | ICD-10-CM

## 2015-03-01 MED ORDER — METOPROLOL TARTRATE 25 MG PO TABS
25 MG | ORAL_TABLET | ORAL | 5 refills | Status: DC
Start: 2015-03-01 — End: 2015-08-19

## 2015-03-01 NOTE — Op Note (Signed)
[facility] ??? 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), s/p MP arthroplasties IF-SF (DOS: 02/09/15)       Insurance information: OT Insurance Information: BCBS  Visit# / total visits:  4      Level of compliance: [x]  Good  []  Fair  []  Poor     Treatment has consisted of: rom , edema  Control, splinting  Patient's primary complaints: swelling, aching  Pain report: 5-6    Dominant Hand: [x]  Right  []  Left     Objective:  (taken post-tx) Current:      [x]  R      []  L   Changes Made   AROM IF MP  PIP  DIP    LF MP  PIP  DIP    RF MP  PIP  DIP    SF MP  PIP  DIP   32/78  30HE/45  0/45    30/74  30HE/50  0/50    30/74  30HE/70  0/50    25/70  28HE/55  0/50     Wrist ext/flex             42/54    Strength  Contraindicated                Functional Status:  Quick DASH score:  93%    Other (Scar, etc.):     Assessment:  Progress made in the following areas:     Increasing rom, decreasing edema  Areas needing additional treatment:     Progression of ROM per MD recommendations           Plan / Recommendations:  [x]  Continue OT treatment:    1-2 times / week for 4-6 week(s)  []  Discharge to HEP unless otherwise ordered.  []  Change plan to:     Electronically signed by: Rebecca Jenkins , OTR/L, PT, CHT   [phone], [phone]     Physician Recommendations:  []  Follow treatment plan as above []  Discontinue hand therapy  []  Change plan to: _______________________________________________________________    [x]  BAO [phone])  []  EGO [phone])  []  AND [phone])          Fax   [phone]                       Fax  [phone]                  Fax   [phone]              []  [facility] [phone])  []  [facility] [phone])  []  SAR [phone])       Fax   [phone]                   Fax  [phone]                        Fax   [phone]

## 2015-03-01 NOTE — Progress Notes (Signed)
Assessment: Excellent position of the MP arthroplasties.  The middle finger is having a little problem with supination and we are going to buddy taped to the index with some pronation force.    Treatment Plan: I made sure that she understood she needs to continue with the splinting protocol that we have been doing.  She is removing the PIP portion of her splints for finger range of motion.  She's also going to use the finger of figure-of-eight splints for her swan-neck deformities of all 4 digits    Return in about 3 weeks (around 03/24/2015) for X-ray next visit.         History of Present Illness  Rebecca Jenkins is a 49 y.o. female.  Three-week postop visit    Review of Systems  Complete Review of Systems performed and is non-contributory except for what is noted in HPI.    Vital Signs  There were no vitals filed for this visit.  There is no height or weight on file to calculate BMI.     Physical Exam  Constitutional:  Patient is well-nourished and demonstrates normal hygiene.  Mental Status:  Patient is alert and oriented to person, place and time.  Skin:  Intact, no rashes or lesions.    Hand Examination: Excellent longitudinal alignment of the MP arthroplasties.  On flexion she does have a little bit of a pronation deformity of the middle finger is it's coursing over toward the ring finger with full flexion.  Her swan-neck deformities look a little worse today she's not been wearing her figure-of-eight splints    Additional Comments:     Additional Examinations:  X-Ray Findings:  PA lateral and oblique x-rays of the right hand show good longitudinal alignment of the MP arthroplasties and all arthroplasties are in good position on the lateral view  Additional Diagnostic Test Findings:    Office Procedures:    Orders Placed This Encounter   Procedures   ??? Hand Right 3V     73130     Order Specific Question:   Reason for exam:     Answer:   Hand Pain

## 2015-03-01 NOTE — Other (Signed)
[facility] - Orthopaedic Sports and Rehabilitation, [place]  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), s/p MP arthroplasties IF-SF (DOS: 02/09/15)                                             Insurance information: OT Insurance Information: BCBS      Visit # Insurance Allowable   4 60     Date of Patient follow up with Physician:  03/01/15    G-Code:  Applied on 03/01/2015      Functional Assessment Tool:  Quick DASH  Score: 93%  Functional Limitation: carrying, moving and handling objects  Current Status (N6295):   CH/0%   CI/1-19%   CJ/20-39%    CK/40-59%     CL/60-79%       CM/80-99%      CN/100%   impaired, limited, or restricted    Goal Status (M8413):    CH/0%   CI/1-19%   CJ/20-39%    CK/40-59%      CL/60-79%       CM/80-99%      CN/100%   impaired, limited, or restricted      Progress Note:   Yes    No  Next due by: Visit #10      Latex Allergy:  NO      YES    Preferred Language for Healthcare:   English       other:    Pain level: see progress note     SUBJECTIVE:  Admits to limited use of figure of eight splints due to intermittent swelling, kept compression sleeves on    RESTRICTIONS/PRECAUTIONS: figure of eight splints to be used IF-RF; buddy tape of LF to IF to assist with alignment    OBJECTIVE:          Date:  02/15/2015 02/17/15 02/25/15 03/01/15    Objective Measures:        IF MP  PIP  DIP    LF MP  PIP  DIP    RF MP  PIP  DIP    SF MP  PIP  DIP   See eval 45/60  22HE/43  8/45    45/65  28HE/43  0/45    35/60  23HE/67  0/60    30/55  12HE/55  0/55  30/75  0/50  0/45    30/80  22/45  0/45    28/75  15/60  0/65    15/60  0/45  0/55 See progress note                    Modalities:         - - - -                    Therapeutic Exercise & Activities:        Incision care Cleaned with peroxide; dressed with gauze wrap, stockinette As prior - small area of increased redness at  2 sutures over dorsal LF MP - MD asst notified Incision cleaned, sutures removed - good healing noted -    AROM IPs x 10x2, wrist ext/flex x 10x2    MP ext/flex via transition between 2 splints IP's 10x 4    MP's x10x2 within contolled range (ext down to exercise splint) with support for allignment     Wrist AROM 10x3 IP's 10x2    MP's 10x2 as prior    Gentle, supported active ext/flex x 10    Digital adduction x 10     Wrist ext/flex 10x2  As prior for all exercises    HEP Instructed on splint program, HEP - handout provided Reviewed HEP, splint schedule Reviewed HEP, splints Reviewed HEP/splints - reinforced decreased HEP frequency, use of buddy tape (to IF) to assist with rotation of LF              Therapeutic Exercise and NMR EXR  [x]  (09811) Provided verbal/tactile cueing for activities related to strengthening, flexibility, endurance, ROM  for improvements in scapular, scapulothoracic and UE control with self care, reaching, carrying, lifting, house/yardwork, driving/computer work.    [x]  [phone]) Provided verbal/tactile cueing for activities related to improving balance, coordination, kinesthetic sense, posture, motor skill, proprioception  to assist with  scapular, scapulothoracic and UE control with self care, reaching, carrying, lifting, house/yardwork, driving/computer work.    Therapeutic Activities:    []  [phone] or [phone]) Provided verbal/tactile cueing for activities related to improving balance, coordination, kinesthetic sense, posture, motor skill, proprioception and motor activation to allow for proper function of scapular, scapulothoracic and UE control with self care,  carrying, lifting, driving/computer work.     Home Exercise Program:    [x]  [phone]) Reviewed/Progressed HEP activities related to strengthening, flexibility, endurance, ROM of scapular, scapulothoracic and UE control with self care, reaching, carrying, lifting, house/yardwork, driving/computer work  []  [phone]) Reviewed/Progressed HEP activities related to improving balance, coordination, kinesthetic sense, posture, motor skill, proprioception of scapular, scapulothoracic and UE control with self care, reaching, carrying, lifting, house/yardwork, driving/computer work      Manual Treatments:  PROM / STM / Oscillations-Mobs:  G-I, II, III, IV (PA's, Inf., Post.)  [x]  (97140) Provided manual therapy to mobilize soft tissue/joints of cervical/CT, scapular GHJ and UE for the purpose of modulating pain, promoting relaxation,  increasing ROM, reducing/eliminating soft tissue swelling/inflammation/restriction, improving soft tissue extensibility and allowing for proper ROM for normal function with self care, reaching, carrying, lifting, house/yardwork, driving/computer work    Splinting:  [x]  Fabrication of:  IF figure of eight splint (Oval 8 splint of properly proper size unavailable )  []  (28413) Checkout for orthotic/prosthetic use, established patient   []  (24401) Orthotic management and training (fitting and assessment)  [x]  Comments: modified night splint for slight PIP flexion IF-SF to prevent swan neck positioning; reinforced wear of RF/LF figure of eight splints    Charges:  Timed Code Treatment Minutes: 60   Total Treatment Minutes: 80     []  OT Eval (02725)   []  OT Re-eval (36644)   [x]  TEx (97110) x  2   []  IHKVQ(25956)  []  NMR (38756) x      []  Estim (attended) (43329)   [x]  Manual (97140) x  1    []  [place] (51884)  []  TA (97530) x      []  Paraffin (16606)  []  ADL  (97535) x     [x]  Splint/Lcode: figure of eight splint fab (IF), splint modification to night splint  []  Estim (  unattended) (97014)  []  Other:      GOALS:  Short Term Goals: To be achieved in: 2 weeks  1. Independent in HEP and progression per patient tolerance, in order to prevent re-injury.   2. Patient will have a decrease in pain to facilitate improvement in movement, function, and ADLs as indicated by Functional Deficits.    Long Term Goals to be achieved in 8-12  weeks:  1) Pt to be independent in graded HEP progression with a good level of effort and compliance.  2) Pt to report a Quick DASH score of 35 % or less on the Quick DASH disability questionnaire for increased performance with carrying, moving, and handling objects.  3) Pt will demonstrate increased ROM to IF-SF MP arc of motion >/= 40 degrees for improved functional performance.  4) Pt will demonstrate increased strength to R grip strength to >/= 50% of L for improved functional performance.  5) Pt will have a decrease in pain to 2-3/10 to facilitate improvement in strength, movement, function, and ADLs.  6) Patient stated goal: use of R hand for functional activities      Progression Towards Functional goals:  [x]  Patient is progressing as expected towards functional goals listed.    []  Progression is slowed due to complexities listed.  []  Progression has been slowed due to co-morbidities.  []  Plan just implemented, too soon to assess goals progression  []  Other:     ASSESSMENT:    Treatment/Activity Tolerance:  [x]  Patient tolerated treatment well []  Patient limited by fatique  []  Patient limited by pain  []  Patient limited by other medical complications  [x]  Other:  ROM continues to progress well; have reinforced pt decrease HEP frequency to promote stability of joints    Prognosis: [x]  Good []  Fair  []  Poor    Patient Requires Follow-up: [x]  Yes  []  No    PLAN:   [x]  Continue per plan of care []  Alter current plan (see comments)  []  Plan of care initiated []  Hold pending MD visit []  Discharge    Electronically signed by: Terika Pillard OTR/L, PT, MPT, CHT, [phone], [phone]

## 2015-03-03 ENCOUNTER — Inpatient Hospital Stay: Attending: Rehabilitative and Restorative Service Providers"

## 2015-03-03 NOTE — Other (Addendum)
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), s/p MP arthroplasties IF-SF (DOS: 02/09/15)                                             Insurance information: OT Insurance Information: BCBS      Visit # Insurance Allowable   5 60     Date of Patient follow up with Physician:  03/24/15    G-Code:  Applied on 03/01/2015      Functional Assessment Tool:  Quick DASH  Score: 93%  Functional Limitation: carrying, moving and handling objects  Current Status (Q2595): []   CH/0% []   CI/1-19% []   CJ/20-39%  []   CK/40-59%   []   CL/60-79%     [x]   CM/80-99%    []   CN/100%   impaired, limited, or restricted    Goal Status (G3875):  []   CH/0% []   CI/1-19% [x]   CJ/20-39%  []   CK/40-59%    []   CL/60-79%     []   CM/80-99%    []   CN/100%   impaired, limited, or restricted      Progress Note: [x]   Yes  []   No  Next due by: Visit #10      Latex Allergy:  [x] NO      [] YES    Preferred Language for Healthcare:   [x] English       [] other:    Pain level: see progress note     SUBJECTIVE:  Reports recent frustration with rheumatologist related to disability status; requesting review of splint application    RESTRICTIONS/PRECAUTIONS: figure of eight splints to be used IF-RF; buddy tape of LF to IF to assist with alignment    OBJECTIVE:          Date:  02/15/2015 02/17/15 02/25/15 03/01/15 03/03/15   Objective Measures:        IF MP  PIP  DIP    LF MP  PIP  DIP    RF MP  PIP  DIP    SF MP  PIP  DIP   See eval 45/60  22HE/43  8/45    45/65  28HE/43  0/45    35/60  23HE/67  0/60    30/55  12HE/55  0/55  30/75  0/50  0/45    30/80  22HE/45  0/45    28/75  15HE/60  0/65    15/60  0/45  0/55 See progress note                    Modalities:         - - - - -                   Therapeutic Exercise & Activities:        Incision care Cleaned with peroxide; dressed with gauze wrap, stockinette As prior - small area of increased redness at 2 sutures over  dorsal LF MP - MD asst notified Incision cleaned, sutures removed - good healing noted -    AROM IPs x 10x2, wrist ext/flex x 10x2    MP ext/flex via transition between 2 splints IP's 10x 4    MP's x10x2 within contolled range (ext down to exercise splint) with support for allignment     Wrist AROM 10x3 IP's 10x2    MP's 10x2 as prior    Gentle, supported active ext/flex x 10    Digital adduction x 10     Wrist ext/flex 10x2  As prior for all exercises IP's 10x2    MP's 10x2    Gentle, supported active ext/flex x 10    Digital adduction x 10     Wrist ext/flex 10x2    HEP Instructed on splint program, HEP - handout provided Reviewed HEP, splint schedule Reviewed HEP, splints Reviewed HEP/splints - reinforced decreased HEP frequency, use of buddy tape (to IF) to assist with rotation of LF Reviewed splint program, fit, and strap application (straps numbered for ease of application)             Therapeutic Exercise and NMR EXR   [phone]) Provided verbal/tactile cueing for activities related to strengthening, flexibility, endurance, ROM  for improvements in scapular, scapulothoracic and UE control with self care, reaching, carrying, lifting, house/yardwork, driving/computer work.     [phone]) Provided verbal/tactile cueing for activities related to improving balance, coordination, kinesthetic sense, posture, motor skill, proprioception  to assist with  scapular, scapulothoracic and UE control with self care, reaching, carrying, lifting, house/yardwork, driving/computer work.    Therapeutic Activities:     [phone] or [phone]) Provided verbal/tactile cueing for activities  related to improving balance, coordination, kinesthetic sense, posture, motor skill, proprioception and motor activation to allow for proper function of scapular, scapulothoracic and UE control with self care, carrying, lifting, driving/computer work.     Home Exercise Program:     [phone]) Reviewed/Progressed HEP activities related to strengthening, flexibility, endurance, ROM of scapular, scapulothoracic and UE control with self care, reaching, carrying, lifting, house/yardwork, driving/computer work   (13086) Reviewed/Progressed HEP activities related to improving balance, coordination, kinesthetic sense, posture, motor skill, proprioception of scapular, scapulothoracic and UE control with self care, reaching, carrying, lifting, house/yardwork, driving/computer work      Manual Treatments:  PROM / STM / Oscillations-Mobs:  G-I, II, III, IV (PA's, Inf., Post.)   (97140) Provided manual therapy to mobilize soft tissue/joints of cervical/CT, scapular GHJ and UE for the purpose of modulating pain, promoting relaxation,  increasing ROM, reducing/eliminating soft tissue swelling/inflammation/restriction, improving soft tissue extensibility and allowing for proper ROM for normal function with self care, reaching, carrying, lifting, house/yardwork, driving/computer work    Splinting:   Fabrication of:     (57846) Checkout for orthotic/prosthetic use, established patient    (96295) Orthotic management and training (fitting and assessment)   Comments:presents with IF splint on LF, LF splint on RF, no splint on IF; reviewed appropriate splint applications and reinforced wear for IF-RF; splint straps numbered for ease of application    Charges:  Timed Code Treatment Minutes: 25   Total Treatment Minutes: 28      OT Eval (28413)    OT Re-eval (24401)    TEx (97110) x       UUVOZ(36644)   NMR (03474) x       Estim (attended) (25956)    Manual (97140) x        [place] (38756)   TA (  97530) x      []   Paraffin ([phone])  []  ADL  (97535) x     []  Splint/Lcode:   []  Estim (unattended) (97014)  [x]  Other: splint modification/training    GOALS:  Short Term Goals: To be achieved in: 2 weeks  1. Independent in HEP and progression per patient tolerance, in order to prevent re-injury.   2. Patient will have a decrease in pain to facilitate improvement in movement, function, and ADLs as indicated by Functional Deficits.    Long Term Goals to be achieved in 8-12  weeks:  1) Pt to be independent in graded HEP progression with a good level of effort and compliance.  2) Pt to report a Quick DASH score of 35 % or less on the Quick DASH disability questionnaire for increased performance with carrying, moving, and handling objects.  3) Pt will demonstrate increased ROM to IF-SF MP arc of motion >/= 40 degrees for improved functional performance.  4) Pt will demonstrate increased strength to R grip strength to >/= 50% of L for improved functional performance.  5) Pt will have a decrease in pain to 2-3/10 to facilitate improvement in strength, movement, function, and ADLs.  6) Patient stated goal: use of R hand for functional activities      Progression Towards Functional goals:  [x]  Patient is progressing as expected towards functional goals listed.    []  Progression is slowed due to complexities listed.  []  Progression has been slowed due to co-morbidities.  []  Plan just implemented, too soon to assess goals progression  []  Other:     ASSESSMENT:    Treatment/Activity Tolerance:  [x]  Patient tolerated treatment well []  Patient limited by fatique  []  Patient limited by pain  []  Patient limited by other medical complications  [x]  Other:  Good rom of digits, reinforcement of splint wear/application needed    Prognosis: [x]  Good []  Fair  []  Poor    Patient Requires Follow-up: [x]  Yes  []  No    PLAN:   [x]  Continue per plan of care []  Alter current plan (see comments)  []  Plan of care initiated []  Hold pending MD visit []   Discharge    Electronically signed by: Yarethzy Croak OTR/L, PT, MPT, CHT, [phone], [phone]

## 2015-03-03 NOTE — Progress Notes (Signed)
This encounter was created in error - please disregard.

## 2015-03-04 ENCOUNTER — Encounter: Attending: Rehabilitative and Restorative Service Providers"

## 2015-03-09 ENCOUNTER — Inpatient Hospital Stay: Attending: Rehabilitative and Restorative Service Providers"

## 2015-03-09 NOTE — Progress Notes (Signed)
[facility] ??? [facility]edics [facility], [place]    Occupational Therapy  Cancellation/No-show Note  Patient Name:  Rebecca Jenkins   DOB:  07/10/1965   Date:  03/09/2015  Cancelled visits to date: 2  No-shows to date: 0    For today's appointment patient:  [x]     Cancelled  []     Rescheduled appointment  []     No-show     Reason given by patient:  [x]     Patient ill  []     Conflicting appointment  []     No transportation    []     Conflict with work  []     No reason given  []     Other:     Comments:      Electronically signed by:  Amauri Medellin OTR/L, PT, CHT, [phone], [phone]

## 2015-03-11 ENCOUNTER — Inpatient Hospital Stay: Attending: Rehabilitative and Restorative Service Providers"

## 2015-03-11 NOTE — Other (Addendum)
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 (DOS: 02/09/15)                                             Insurance information: OT Insurance Information: BCBS      Visit # Insurance Allowable   6 60     Date of Patient follow up with Physician:  03/24/15    G-Code:  Applied on 03/01/2015      Functional Assessment Tool:  Quick DASH  Score: 93%  Functional Limitation: carrying, moving and handling objects  Current Status (D6644): []   CH/0% []   CI/1-19% []   CJ/20-39%  []   CK/40-59%   []   CL/60-79%     [x]   CM/80-99%    []   CN/100%   impaired, limited, or restricted    Goal Status (I3474):  []   CH/0% []   CI/1-19% [x]   CJ/20-39%  []   CK/40-59%    []   CL/60-79%     []   CM/80-99%    []   CN/100%   impaired, limited, or restricted      Progress Note: [x]   Yes  []   No  Next due by: Visit #10      Latex Allergy:  [x] NO      [] YES    Preferred Language for Healthcare:   [x] English       [] other:    Pain level:  4-5/10    SUBJECTIVE:  Cancelled earlier this week due to illness; admits intermittent noncompliance with figure of eight splints and buddy tapes    RESTRICTIONS/PRECAUTIONS: figure of eight splints to be used IF-RF; buddy tape of LF to IF to assist with alignment    OBJECTIVE:          Date: 02/25/15 03/01/15 03/03/15 03/11/15   Objective Measures:       IF MP  PIP  DIP    LF MP  PIP  DIP    RF MP  PIP  DIP    SF MP  PIP  DIP   30/75  0/50  0/45    30/80  22HE/45  0/45    28/75  15HE/60  0/65    15/60  0/45  0/55 See progress note   30/82  45  45    30/85  53  45    30/82  75  60    20/75  70  60                 Modalities:       TENS with HP - - - 15'                 Therapeutic Exercise & Activities:       Incision care Incision cleaned, sutures removed - good healing noted -  -   AROM IP's 10x2    MP's 10x2 as prior    Gentle, supported  active ext/flex x 10    Digital adduction x 10     Wrist ext/flex 10x2  As prior for all exercises IP's 10x2    MP's 10x2    Gentle, supported active ext/flex x 10    Digital adduction x 10     Wrist ext/flex 10x2  IPs 10x2    10x2      10x              10x2   HEP Reviewed HEP, splints Reviewed HEP/splints - reinforced decreased HEP frequency, use of buddy tape (to IF) to assist with rotation of LF Reviewed splint program, fit, and strap application (straps numbered for ease of application) Reviewed splint program (including fig 8 splints and use of buddy taping)            Therapeutic Exercise and NMR EXR   (97110) Provided verbal/tactile cueing for activities related to strengthening, flexibility, endurance, ROM  for improvements in scapular, scapulothoracic and UE control with self care, reaching, carrying, lifting, house/yardwork, driving/computer work.     [phone]) Provided verbal/tactile cueing for activities related to improving balance, coordination, kinesthetic sense, posture, motor skill, proprioception  to assist with  scapular, scapulothoracic and UE control with self care, reaching, carrying, lifting, house/yardwork, driving/computer work.    Therapeutic Activities:     [phone] or [phone]) Provided verbal/tactile cueing for activities related to improving balance, coordination, kinesthetic sense, posture, motor skill, proprioception and motor activation to allow for proper function of scapular, scapulothoracic and UE control with self care, carrying, lifting, driving/computer work.     Home Exercise Program:     [phone]) Reviewed/Progressed HEP activities related to strengthening,  flexibility, endurance, ROM of scapular, scapulothoracic and UE control with self care, reaching, carrying, lifting, house/yardwork, driving/computer work   (29562) Reviewed/Progressed HEP activities related to improving balance, coordination, kinesthetic sense, posture, motor skill, proprioception of scapular, scapulothoracic and UE control with self care, reaching, carrying, lifting, house/yardwork, driving/computer work      Manual Treatments:  PROM / STM / Oscillations-Mobs:  G-I, II, III, IV (PA's, Inf., Post.)   (97140) Provided manual therapy to mobilize soft tissue/joints of cervical/CT, scapular GHJ and UE for the purpose of modulating pain, promoting relaxation,  increasing ROM, reducing/eliminating soft tissue swelling/inflammation/restriction, improving soft tissue extensibility and allowing for proper ROM for normal function with self care, reaching, carrying, lifting, house/yardwork, driving/computer work  - STM, retrograde massage    Splinting:   Fabrication of:     (13086) Checkout for orthotic/prosthetic use, established patient    (57846) Orthotic management and training (fitting and assessment)   Comments:modified splints for increased comfort and support; reinforced wear of figure of eight splints IF-RF and use of buddy taping (LF/IF) for control of joint laxity    Charges:  Timed Code Treatment Minutes: 28   Total Treatment Minutes: 50      OT Eval (96295)    OT Re-eval (28413)    TEx (97110) x  1    KGMWN(02725)   NMR (36644) x       Estim (attended) (03474)    Manual (97140) x  1     [place] (25956)   TA (97530) x       Paraffin (38756)   ADL  (97535) x      Splint/Lcode:    Estim (unattended) (97014)   Other: splint modification/training    GOALS:  Short Term Goals: To be achieved in: 2 weeks  1. Independent in HEP and progression  per patient tolerance, in order to prevent re-injury.   2. Patient will have a decrease in pain to facilitate improvement in  movement, function, and ADLs as indicated by Functional Deficits.    Long Term Goals to be achieved in 8-12  weeks:  1) Pt to be independent in graded HEP progression with a good level of effort and compliance.  2) Pt to report a Quick DASH score of 35 % or less on the Quick DASH disability questionnaire for increased performance with carrying, moving, and handling objects.  3) Pt will demonstrate increased ROM to IF-SF MP arc of motion >/= 40 degrees for improved functional performance.  4) Pt will demonstrate increased strength to R grip strength to >/= 50% of L for improved functional performance.  5) Pt will have a decrease in pain to 2-3/10 to facilitate improvement in strength, movement, function, and ADLs.  6) Patient stated goal: use of R hand for functional activities      Progression Towards Functional goals:  [x]  Patient is progressing as expected towards functional goals listed.    []  Progression is slowed due to complexities listed.  []  Progression has been slowed due to co-morbidities.  []  Plan just implemented, too soon to assess goals progression  []  Other:     ASSESSMENT:    Treatment/Activity Tolerance:  [x]  Patient tolerated treatment well []  Patient limited by fatique  []  Patient limited by pain  []  Patient limited by other medical complications  [x]  Other:  Continued good ROM of digits; reinforcement needed for wear of figure of eight splints and use of buddy taping (for LF) to address laxity of digits    Prognosis: [x]  Good []  Fair  []  Poor    Patient Requires Follow-up: [x]  Yes  []  No    PLAN:   [x]  Continue per plan of care []  Alter current plan (see comments)  []  Plan of care initiated []  Hold pending MD visit []  Discharge    Electronically signed by: Malik Ruffino OTR/L, PT, MPT, CHT, [phone], [phone]

## 2015-03-15 MED ORDER — OXYCODONE HCL 5 MG PO TABS
5 MG | ORAL_TABLET | Freq: Three times a day (TID) | ORAL | 0 refills | Status: AC | PRN
Start: 2015-03-15 — End: 2015-03-22

## 2015-03-17 ENCOUNTER — Inpatient Hospital Stay: Attending: Rehabilitative and Restorative Service Providers"

## 2015-03-17 NOTE — Other (Addendum)
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 ([place]), s/p MP arthroplasties IF-SF (DOS: 02/09/15)                                             Insurance information: OT Insurance Information: BCBS      Visit # Insurance Allowable   7 60     Date of Patient follow up with Physician:  03/24/15    G-Code:  Applied on 03/17/2015      Functional Assessment Tool:  Quick DASH  Score: 93%  Functional Limitation: carrying, moving and handling objects  Current Status (T7322): []   CH/0% []   CI/1-19% []   CJ/20-39%  []   CK/40-59%   []   CL/60-79%     [x]   CM/80-99%    []   CN/100%   impaired, limited, or restricted    Goal Status (G2542):  []   CH/0% []   CI/1-19% [x]   CJ/20-39%  []   CK/40-59%    []   CL/60-79%     []   CM/80-99%    []   CN/100%   impaired, limited, or restricted      Progress Note: [x]   Yes  []   No  Next due by: Visit #10      Latex Allergy:  [x] NO      [] YES    Preferred Language for Healthcare:   [x] English       [] other:    Pain level:  7.5/10    SUBJECTIVE:  Reports tenderness intermittently over IF MP, admits occasional pinching with R th/IF; intermittent noncompliance with splints    RESTRICTIONS/PRECAUTIONS: figure of eight splints to be used IF-RF; buddy tape of LF to IF to assist with alignment    OBJECTIVE: presents with day splint donned, however figure of eight splints not donned IF-RF         Date: 02/25/15 03/01/15 03/03/15 03/11/15 03/17/15   Objective Measures:        IF MP  PIP  DIP    LF MP  PIP  DIP    RF MP  PIP  DIP    SF MP  PIP  DIP    30/75  0/50  0/45    30/80  22HE/45  0/45    28/75  15HE/60  0/65    15/60  0/45  0/55 See progress note  30/82  45  45    30/85  53  45    30/82  75  60    20/75  70  60 30/86  23HE/49  45    30/90  25HE/58  60    30/85  15HE/90  65    15/77  12HE/80  65   Wrist ext/flex                 50/55           Modalities:        TENS with HP - - -  15' 15'                   Therapeutic Exercise & Activities:        Incision care Incision cleaned, sutures removed - good healing noted -  - -   AROM IP's 10x2    MP's 10x2 as prior    Gentle, supported active ext/flex x 10    Digital adduction x 10     Wrist ext/flex 10x2  As prior for all exercises IP's 10x2    MP's 10x2    Gentle, supported active ext/flex x 10    Digital adduction x 10     Wrist ext/flex 10x2  IPs 10x2    10x2      10x              10x2 10x2    10x2      10x              10x2   HEP Reviewed HEP, splints Reviewed HEP/splints - reinforced decreased HEP frequency, use of buddy tape (to IF) to assist with rotation of LF Reviewed splint program, fit, and strap application (straps numbered for ease of application) Reviewed splint program (including fig 8 splints and use of buddy taping) As prior;reinforced precautions/contraindications including  no ulnar deviation forces/pinching to digits              Therapeutic Exercise and NMR EXR  [x]  (97110) Provided verbal/tactile cueing for activities related to strengthening, flexibility, endurance, ROM  for improvements in scapular, scapulothoracic and UE control with self care, reaching, carrying, lifting, house/yardwork, driving/computer work.    [x]  [phone]) Provided verbal/tactile cueing for activities related to improving balance, coordination, kinesthetic sense, posture, motor skill, proprioception  to assist with  scapular, scapulothoracic and UE control with self care, reaching, carrying, lifting, house/yardwork, driving/computer work.    Therapeutic Activities:    []  [phone] or [phone]) Provided verbal/tactile  cueing for activities related to improving balance, coordination, kinesthetic sense, posture, motor skill, proprioception and motor activation to allow for proper function of scapular, scapulothoracic and UE control with self care, carrying, lifting, driving/computer work.     Home Exercise Program:    [x]  [phone]) Reviewed/Progressed HEP activities related to strengthening, flexibility, endurance, ROM of scapular, scapulothoracic and UE control with self care, reaching, carrying, lifting, house/yardwork, driving/computer work  []  (51700) Reviewed/Progressed HEP activities related to improving balance, coordination, kinesthetic sense, posture, motor skill, proprioception of scapular, scapulothoracic and UE control with self care, reaching, carrying, lifting, house/yardwork, driving/computer work      Manual Treatments:  PROM / STM / Oscillations-Mobs:  G-I, II, III, IV (PA's, Inf., Post.)  [x]  (97140) Provided manual therapy to mobilize soft tissue/joints of cervical/CT, scapular GHJ and UE for the purpose of modulating pain, promoting relaxation,  increasing ROM, reducing/eliminating soft tissue swelling/inflammation/restriction, improving soft tissue extensibility and allowing for proper ROM for normal function with self care, reaching, carrying, lifting, house/yardwork, driving/computer work  - STM, retrograde massage    Splinting:  []  Fabrication of:    []  (17494) Checkout for orthotic/prosthetic use, established patient   []  (49675) Orthotic management and training (fitting and assessment)  [x]  Comments:Oval 8 splint issued to pt for IF PIP to replace custom splint; reinforced wear of figure of eight splints IF-RF and use of buddy taping (LF/IF) for control of joint laxity, use of buddy tape to assist with proper alignment of LF    Charges:  Timed Code Treatment Minutes: 30  Total Treatment Minutes: 50     []  OT Eval (97003)   []  OT Re-eval (97004)   [x]  TEx (97110) x  1   []  KDTOI(71245)  []  NMR (80998) x       []  Estim (attended) (33825)   [x]  Manual (97140) x  1    []  [place] (05397)  []  TA (97530) x      []  Paraffin (67341)  []  ADL  (97535) x     []  Splint/Lcode:   [x]  Estim (unattended) (97014)  []  Other: splint modification/training    GOALS:  Short Term Goals: To be achieved in: 2 weeks  1. Independent in HEP and progression per patient tolerance, in order to prevent re-injury.   2. Patient will have a decrease in pain to facilitate improvement in movement, function, and ADLs as indicated by Functional Deficits.    Long Term Goals to be achieved in 8-12  weeks:  1) Pt to be independent in graded HEP progression with a good level of effort and compliance.  2) Pt to report a Quick DASH score of 35 % or less on the Quick DASH disability questionnaire for increased performance with carrying, moving, and handling objects.  3) Pt will demonstrate increased ROM to IF-SF MP arc of motion >/= 40 degrees for improved functional performance. Met 03/17/15  4) Pt will demonstrate increased strength to R grip strength to >/= 50% of L for improved functional performance.  5) Pt will have a decrease in pain to 2-3/10 to facilitate improvement in strength, movement, function, and ADLs.  6) Patient stated goal: use of R hand for functional activities      Progression Towards Functional goals:  [x]  Patient is progressing as expected towards functional goals listed.    []  Progression is slowed due to complexities listed.  []  Progression has been slowed due to co-morbidities.  []  Plan just implemented, too soon to assess goals progression  [x]  Other: increased rom, goal 3 met     ASSESSMENT:    Treatment/Activity Tolerance:  [x]  Patient tolerated treatment well []  Patient limited by fatique  []  Patient limited by pain  []  Patient limited by other medical complications  [x]  Other:  Continued increased ROM of digits; reinforcement needed for wear of figure of eight splints and use of buddy taping (for LF) to address laxity of digits;  reinforcement needed to prevent overuse, emphasis on no ulnar deviation forces/pinch to digits which could compromise the implants    Prognosis: [x]  Good []  Fair  []  Poor    Patient Requires Follow-up: [x]  Yes  []  No    PLAN:   [x]  Continue per plan of care, followup after MD appt 03/25/15 []  Alter current plan (see comments)  []  Plan of care initiated []  Hold pending MD visit []  Discharge    Electronically signed by: [place] OTR/L, PT, MPT, [place], [phone], [phone]

## 2015-03-17 NOTE — Op Note (Signed)
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  30/90  25HE/58  60  ??  30/85  15HE/90  65  ??  15/77  12HE/80  65    Wrist ext/flex             50/55   Strength  Contraindicated              Functional Status:  Quick DASH score:  93%    Other (Scar, etc.):     Assessment:  Progress made in the following areas:     Increasing rom, decreasing edema  Areas needing additional treatment:     Progression of ROM and functional activity per MD recommendations           Plan / Recommendations:  [x]  Continue OT treatment:    1-2 times / week for 4-6 week(s)  []  Discharge to HEP unless otherwise ordered.  []  Change plan to:     Electronically signed by: Joseph ArtWhitney Kalil Woessner , OTR/L, PT, CHT   [phone], [phone]     Physician Recommendations:  []  Follow treatment plan as above []  Discontinue hand therapy  []  Change plan to: _______________________________________________________________    [x]  [facility] [phone]  []  [facility] [phone])  []  AND [phone])          Fax   [phone]                       Fax  [phone]                   Fax  [phone]              []  [facility] [phone])  []  [facility] [phone]  []  [facility] [phone])       Fax   [phone]                   Fax  [phone]                        Fax   [phone]

## 2015-03-23 MED ORDER — METFORMIN HCL 500 MG PO TABS
500 MG | ORAL_TABLET | ORAL | 1 refills | Status: DC
Start: 2015-03-23 — End: 2015-05-21

## 2015-03-25 ENCOUNTER — Ambulatory Visit: Admit: 2015-03-25 | Discharge: 2015-03-25 | Payer: BLUE CROSS/BLUE SHIELD | Attending: Hand Surgery

## 2015-03-25 ENCOUNTER — Inpatient Hospital Stay: Attending: Rehabilitative and Restorative Service Providers"

## 2015-03-25 ENCOUNTER — Ambulatory Visit: Admit: 2015-03-25 | Payer: BLUE CROSS/BLUE SHIELD

## 2015-03-25 DIAGNOSIS — M79641 Pain in right hand: Secondary | ICD-10-CM

## 2015-03-25 NOTE — Other (Signed)
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 ([place]), s/p MP arthroplasties IF-SF (DOS: 02/09/15)                                             Insurance information: OT Insurance Information: BCBS      Visit # Insurance Allowable   8 60     Date of Patient follow up with Physician:  03/24/15    G-Code:  Applied on 03/17/2015      Functional Assessment Tool:  Quick DASH  Score: 93%  Functional Limitation: carrying, moving and handling objects  Current Status (F7510): _0   CH/0% _1   CI/1-19% _2   CJ/20-39%  _3   CK/40-59%   _4   CL/60-79%     _5   CM/80-99%    _6   CN/100%   impaired, limited, or restricted    Goal Status (C5852):  _7   CH/0% _8   CI/1-19% _9   CJ/20-39%  _10   CK/40-59%    _11   CL/60-79%     _12   CM/80-99%    _13   CN/100%   impaired, limited, or restricted      Progress Note: _14   Yes  _15   No  Next due by: Visit #10      Latex Allergy:  _16 NO      _17 YES    Preferred Language for Healthcare:   _18 English       _19 other:    Pain level:  7.5/10    SUBJECTIVE:  Seen by MD on this date, see splint section for MD recommendations; doing well with exercises, admits overuse at times, including occasional pinch activities    RESTRICTIONS/PRECAUTIONS: figure of eight splints to be used IF-RF; buddy tape of LF to IF to assist with alignment    OBJECTIVE:          Date: 03/11/15 03/17/15 03/25/15   Objective Measures:      IF MP  PIP  DIP    LF MP  PIP  DIP    RF MP  PIP  DIP    SF MP  PIP  DIP   30/82  45  45    30/85  53  45    30/82  75  60    20/75  70  60  30/86  23HE/49  45    30/90  25HE/58  60    30/85  15HE/90  65    15/77  12HE/80  65 Seen for splint modifications only due  To ROM progressing nicely per MD on this date   Wrist ext/flex              50/55          Modalities:      TENS with HP 15' 15'                Therapeutic Exercise & Activities:      Incision care - -    AROM  IPs 10x2    10x2      10x              10x2 10x2    10x2      10x              10x2    HEP Reviewed splint program (including fig 8 splints and use of buddy taping) As prior;reinforced precautions/contraindications including  no ulnar deviation forces/pinching to digits             Therapeutic Exercise and NMR EXR  _0  (16010) Provided verbal/tactile cueing for activities related to strengthening, flexibility, endurance, ROM  for improvements in scapular, scapulothoracic and UE control with self care, reaching, carrying, lifting, house/yardwork, driving/computer work.    _1  [phone]) Provided verbal/tactile cueing for activities related to improving balance, coordination, kinesthetic sense, posture, motor skill, proprioception  to assist with  scapular, scapulothoracic and UE control with self care, reaching, carrying, lifting, house/yardwork, driving/computer work.    Therapeutic Activities:    _2  (57322 or 02542) Provided verbal/tactile cueing for activities related to improving balance, coordination, kinesthetic sense, posture, motor skill, proprioception and motor activation to allow for proper function of scapular, scapulothoracic and UE control with self care, carrying, lifting, driving/computer work.     Home Exercise Program:    _3  (70623) Reviewed/Progressed HEP activities related to strengthening, flexibility, endurance, ROM of scapular, scapulothoracic and UE control with self care, reaching, carrying, lifting, house/yardwork, driving/computer work  _4  (76283) Reviewed/Progressed HEP activities related to improving balance, coordination, kinesthetic sense, posture, motor skill,  proprioception of scapular, scapulothoracic and UE control with self care, reaching, carrying, lifting, house/yardwork, driving/computer work      Manual Treatments:  PROM / STM / Oscillations-Mobs:  G-I, II, III, IV (PA's, Inf., Post.)  _5  (97140) Provided manual therapy to mobilize soft tissue/joints of cervical/CT, scapular GHJ and UE for the purpose of modulating pain, promoting relaxation,  increasing ROM, reducing/eliminating soft tissue swelling/inflammation/restriction, improving soft tissue extensibility and allowing for proper ROM for normal function with self care, reaching, carrying, lifting, house/yardwork, driving/computer work  - STM, retrograde massage    Splinting:  _6  Fabrication of:    _7  (15176) Checkout for orthotic/prosthetic use, established patient   _8  (16073) Orthotic management and training (fitting and assessment)  _9  Comments:Oval 8 splints IF-RF modified for slight PIP flexion per MD request, FA based MP blocker modified for increased MP extension in order to use as protection for at risk daily tasks per MD recommendation, otherwise may discontinue daytime splinting; pt to continue with night splint    Charges:  Timed Code Treatment Minutes: 30   Total Treatment Minutes: 30     _10  OT Eval (97003)   _11  OT Re-eval (97004)   _12  TEx (97110) x  1   _13  XTGGY(69485)  _14  NMR (46270) x      _15  Estim (attended) (35009)   _16  Manual (97140) x  1    _17  [place] (38182)  _18  TA (97530) x      _19  Paraffin (99371)  _20  ADL  (97535) x     _21  Splint/Lcode:   _22  Estim (unattended) (97014)  _23  Other: splint modification/training x 2    GOALS:  Short Term Goals: To be achieved in: 2 weeks  1. Independent in HEP and progression per patient tolerance, in order to prevent re-injury.   2. Patient will have a decrease in pain to facilitate improvement in movement, function, and ADLs as indicated by Functional  Deficits.    Long Term Goals to be achieved in 8-12  weeks:  1) Pt to be independent in graded HEP progression  with a good level of effort and compliance.  2) Pt to report a Quick DASH score of 35 % or less on the Quick DASH disability questionnaire for increased performance with carrying, moving, and handling objects.  3) Pt will demonstrate increased ROM to IF-SF MP arc of motion >/= 40 degrees for improved functional performance. Met 03/17/15  4) Pt will demonstrate increased strength to R grip strength to >/= 50% of L for improved functional performance.  5) Pt will have a decrease in pain to 2-3/10 to facilitate improvement in strength, movement, function, and ADLs.  6) Patient stated goal: use of R hand for functional activities      Progression Towards Functional goals:  _0  Patient is progressing as expected towards functional goals listed.    _1  Progression is slowed due to complexities listed.  _2  Progression has been slowed due to co-morbidities.  _3  Plan just implemented, too soon to assess goals progression  _4  Other:      ASSESSMENT:    Treatment/Activity Tolerance:  _5  Patient tolerated treatment well _6  Patient limited by fatique  _7  Patient limited by pain  _8  Patient limited by other medical complications  <UYQIHKVQQVZDGLOV>_5<\/IEPPIRJJOACZYSAY>_3  Other:  Pt continues to do well with ROM; reinforcement of precautions needed; good splint fit after modifications    Prognosis: _10  Good _11  Fair  _12  Poor    Patient Requires Follow-up: _13  Yes  _14  No    PLAN:   _15  Continue per plan of care, followup with pt every 2 weeks to modify splints, reassess and progress functional ROM _16  Alter current plan (see comments)  _17  Plan of care initiated _18  Hold pending MD visit _19  Discharge    Electronically signed by: La Pryor OTR/L, PT, MPT, [place], [phone], [phone]

## 2015-03-25 NOTE — Progress Notes (Signed)
This encounter was created in error - please disregard.

## 2015-03-25 NOTE — Progress Notes (Signed)
Assessment: Excellent alignment of the MP arthroplasties.    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  She is going to use her splint during strenuous activities    Return in about 6 weeks (around 05/06/2015) for X-ray next visit.         History of Present Illness  Rebecca Jenkins is a 49 y.o. female.  6 week postop visit    Review of Systems  Complete Review of Systems performed and is non-contributory except for what is noted in HPI.    Vital Signs  There were no vitals filed for this visit.  There is no height or weight on file to calculate BMI.     Physical Exam  Constitutional:  Patient is well-nourished and demonstrates normal hygiene.  Mental Status:  Patient is alert and oriented to person, place and time.  Skin:  Intact, no rashes or lesions.    Hand Examination: Excellent alignment of her fingers    Additional Comments:     Additional Examinations:  X-Ray Findings:  PA lateral oblique x-rays of the right hand show excellent position of the endplate arthroplasties  Additional Diagnostic Test Findings:    Office Procedures:    Orders Placed This Encounter   Procedures   ??? Hand Right 3V     73130     Order Specific Question:   Reason for exam:     Answer:   Hand Pain

## 2015-04-08 ENCOUNTER — Inpatient Hospital Stay: Attending: Rehabilitative and Restorative Service Providers"

## 2015-04-08 NOTE — Progress Notes (Signed)
[facility] ??? [facility], [place]    Occupational Therapy  Cancellation/No-show Note  Patient Name:  Gerre ScullJacqueline A Masse   DOB:  1966-02-21   Date:  04/08/2015  Cancelled visits to date: 2  No-shows to date: 1    For today's appointment patient:  []     Cancelled  []     Rescheduled appointment  [x]     No-show     Reason given by patient:  []     Patient ill  []     Conflicting appointment  []     No transportation    []     Conflict with work  []     No reason given  []     Other:     Comments:      Electronically signed by:  Latiffany Harwick OTR/L, PT, CHT, [phone], [phone]

## 2015-04-22 ENCOUNTER — Inpatient Hospital Stay: Attending: Rehabilitative and Restorative Service Providers"

## 2015-04-22 NOTE — Progress Notes (Signed)
[facility] ??? [facility], [place]    Occupational Therapy  Cancellation/No-show Note  Patient Name:  Rebecca Jenkins   DOB:  May 10, 1965   Date:  04/22/2015  Cancelled visits to date: 2  No-shows to date: 2    For today's appointment patient:      Cancelled      Rescheduled appointment      No-show     Reason given by patient:      Patient ill      Conflicting appointment      No transportation        Conflict with work      No reason given      Other:     Comments:      Electronically signed by:  Tymira Horkey OTR/L, PT, CHT, [phone], [phone]

## 2015-04-26 MED ORDER — LEVOTHYROXINE SODIUM 25 MCG PO TABS
25 MCG | ORAL_TABLET | ORAL | 2 refills | Status: AC
Start: 2015-04-26 — End: ?

## 2015-05-10 ENCOUNTER — Encounter: Attending: Hand Surgery

## 2015-05-21 MED ORDER — METFORMIN HCL 500 MG PO TABS
500 MG | ORAL_TABLET | ORAL | 1 refills | Status: AC
Start: 2015-05-21 — End: ?

## 2015-08-19 ENCOUNTER — Encounter

## 2015-08-19 MED ORDER — VITAMIN D3 50 MCG (2000 UT) PO TABS
50 MCG (2000 UT) | ORAL_TABLET | ORAL | 1 refills | Status: AC
Start: 2015-08-19 — End: ?

## 2015-08-19 MED ORDER — METOPROLOL TARTRATE 25 MG PO TABS
25 MG | ORAL_TABLET | ORAL | 1 refills | Status: DC
Start: 2015-08-19 — End: 2015-11-08

## 2015-11-08 MED ORDER — METOPROLOL TARTRATE 25 MG PO TABS
25 MG | ORAL_TABLET | ORAL | 0 refills | Status: DC
Start: 2015-11-08 — End: 2015-12-10

## 2015-12-10 MED ORDER — METOPROLOL TARTRATE 25 MG PO TABS
25 MG | ORAL_TABLET | ORAL | 0 refills | Status: AC
Start: 2015-12-10 — End: ?

## 2016-08-25 NOTE — Telephone Encounter (Signed)
Pt is calling to state [place] is now living [place].    She is requesting her medical records. Advised her she will need to fill out a release form. Will call back with the fax number.

## 2021-04-24 IMAGING — MR MRI CERVICAL SPINE WITHOUT CONTRAST
4 of 6 series · 19 of 48 positions shown · IV contrast (gadolinium)
Comparison: None

________________________________________________________________________________________________ 
MRI CERVICAL SPINE WITHOUT CONTRAST, 04/24/2021 [DATE]: 
CLINICAL INDICATION: Neck pain, right worse on left, headache
TECHNIQUE: Sagittal T1, Sagittal T2, Sagittal STIR, Axial TSE and Axial 3PIIQ 
images of the cervical spine were performed without intravenous gadolinium 
enhancement.

[Series 201: survey · axial · 10.0mm · 1.25mm/px · z∈[-145,+62]mm · 4 of 10 slices shown]
[im 1/10]
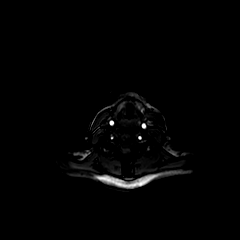
[im 4/10]
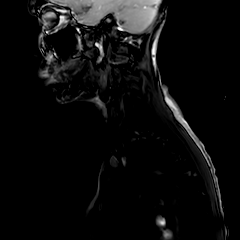
[im 7/10]
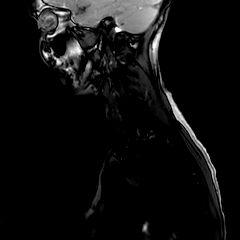
[im 10/10]
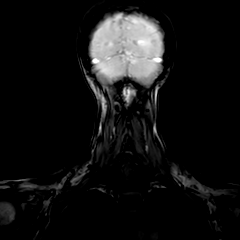

[Series 301: t2w_cor-surv · coronal · 5.0mm · 0.69mm/px · 3 of 7 slices shown]
[im 1/7]
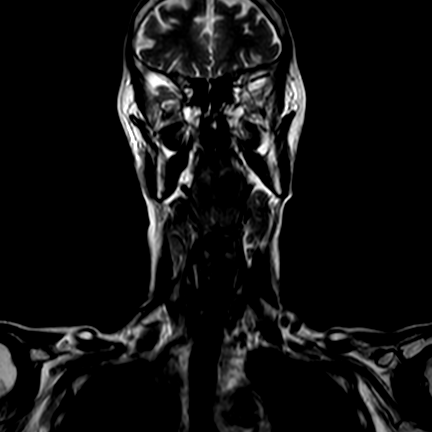
[im 4/7]
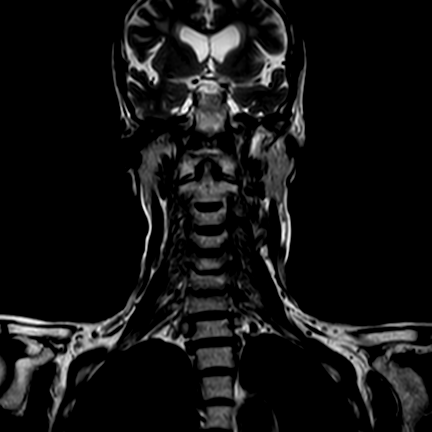
[im 7/7]
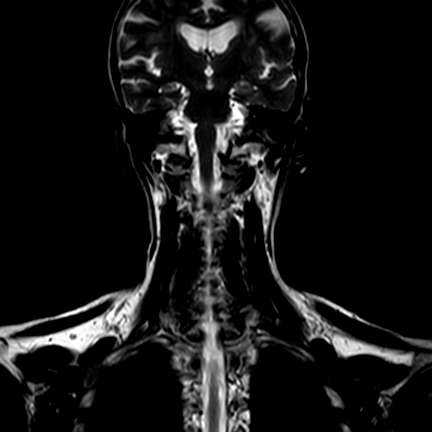

[Series 401: t1_sag · sagittal · 3.0mm · 0.34mm/px · 8 of 15 slices shown]
[im 1/15]
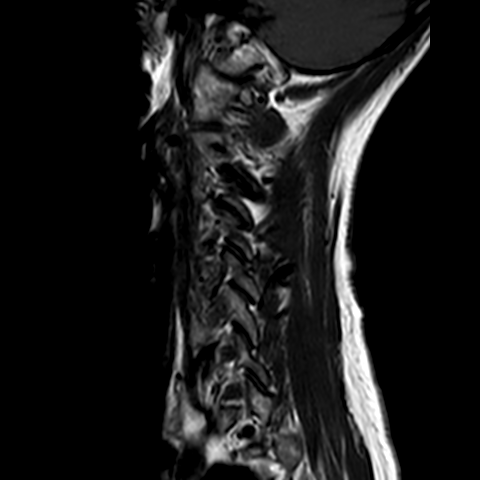
[im 3/15]
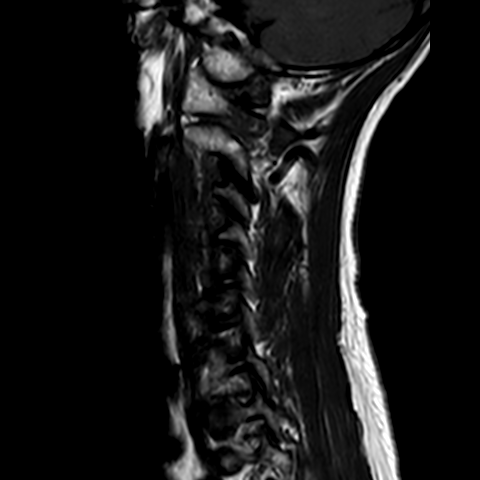
[im 5/15]
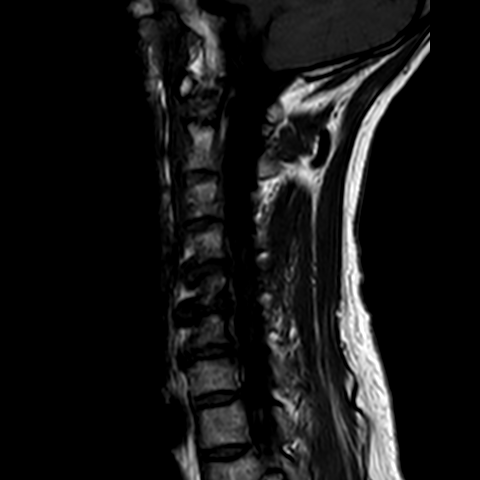
[im 7/15]
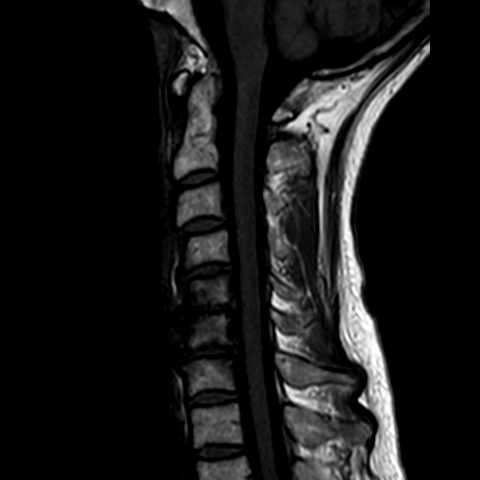
[im 9/15]
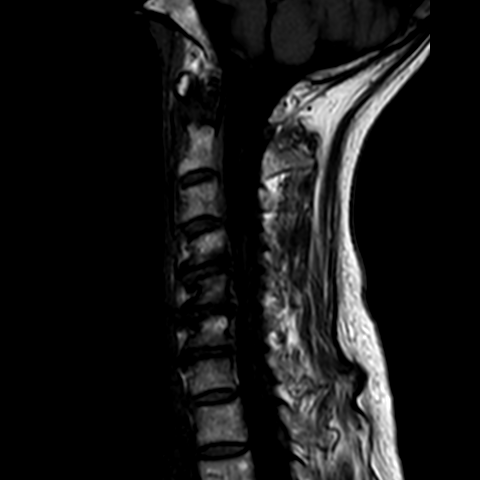
[im 11/15]
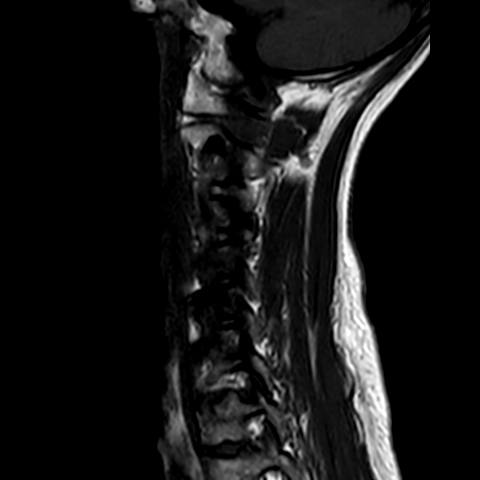
[im 13/15]
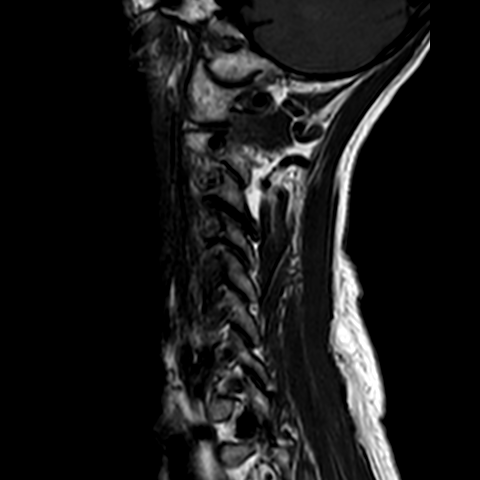
[im 15/15]
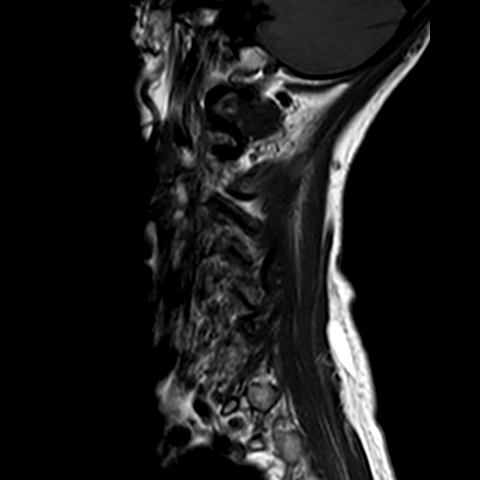

[Series 501: t2w_mv_xd_sag · sagittal · 3.0mm · 0.31mm/px · 4 of 15 slices shown]
[im 1/15]
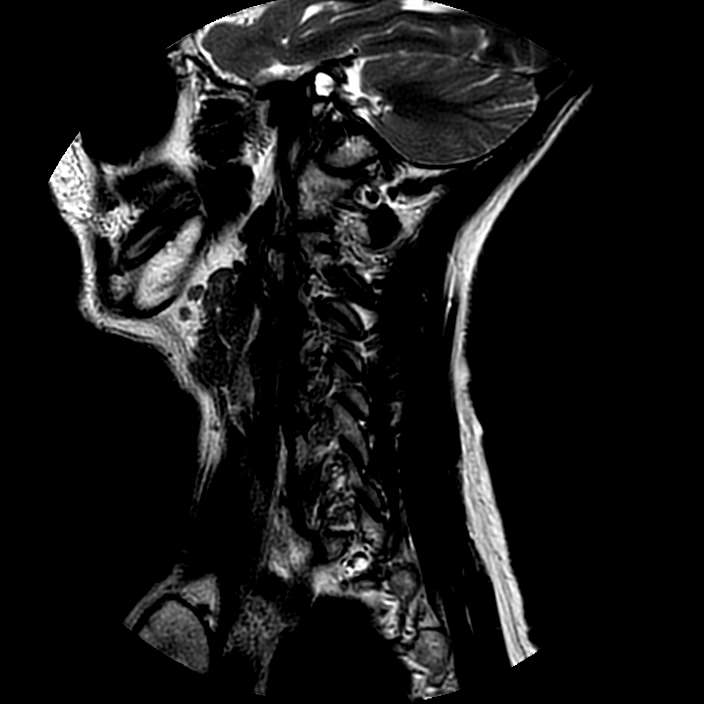
[im 3/15]
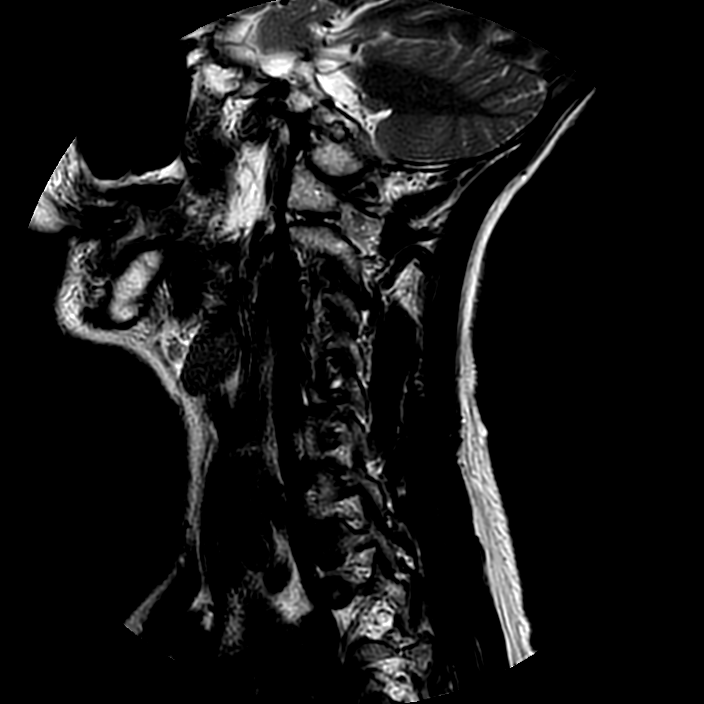
[im 9/15]
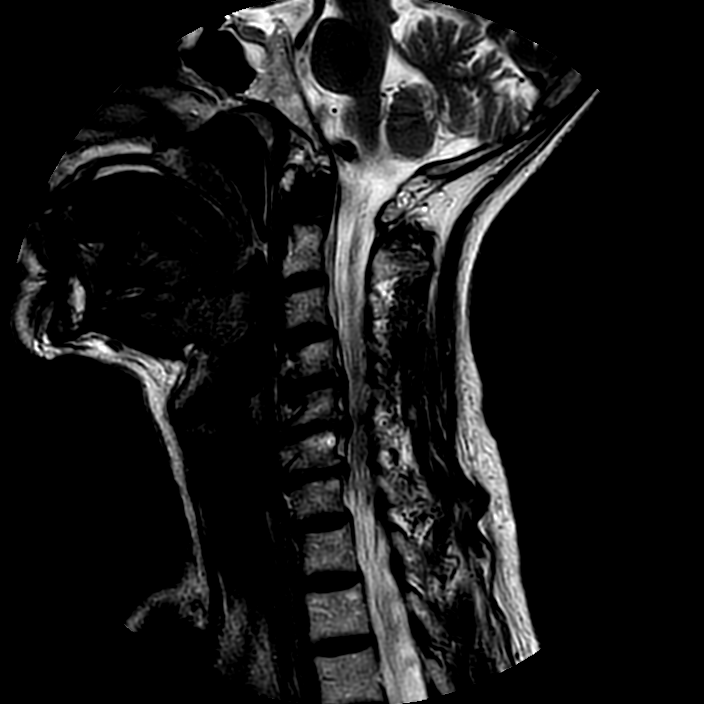
[im 13/15]
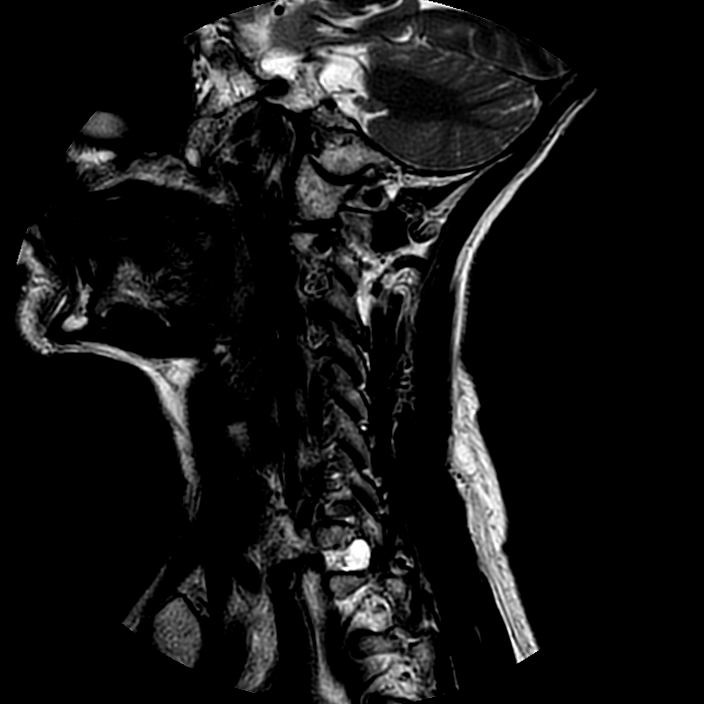

[19 of 48 positions shown; findings below may reference images not displayed]

FINDINGS: Cervical vertebral heights are intact. There is 1 mm anterolisthesis 
at C3-4. Marked disc narrowing at C5-6 with moderate narrowing at C4-5 and 
moderate to marked narrowing C6-7. There is anterior osteophyte from C4 through 
C7. 
Lordosis is straightened which could indicate spasm. 
Allowing for slight motion artifact cord signal appears normal. 
There are Modic type I changes at C5-6. 
No evidence for fracture or malignancy. 
The dens is intact. The craniocervical junction is open. There is mild sphenoid 
sinus mucosal thickening. 
Spondylosis is superimposed on short posterior elements. There is 
disc-osteophyte encroaching on the ventral cord at C4-5, C5-6, and C6-7, with 
canal diameter measuring 9 mm at C4-5, 7 mm C5-6, 9 mm C6-7 there is mild 
deformity of the left ventral cord at C5-6, sagittal image 8. The canal is 
borderline narrow at other levels. 
Axial images show mild bilateral foraminal narrowing at C4-5. There is marked 
left, moderate to marked right foraminal stenosis at C5-6, axial image 14. At 
C6-7 there is moderate left, mild to moderate right foraminal stenosis, axial 
image 9. Foramina are open elsewhere. 
Note made of perineural root sleeve cyst within the left T1-2 foramen, 
developmental finding of unlikely significance.
IMPRESSION: Spondylosis superimposed on short posterior elements. There is disc-osteophyte 
mildly deforming the left ventral cord at C5-6 where there is mild to moderate 
canal stenosis. 
Mild canal stenosis at C4-5 and C6-7. 
Foraminal stenosis is most pronounced at C5-6, with marked left, moderate to 
marked right foraminal impingement. 
Straightened lordosis could indicate spasm. 
Reactive endplate edema/Modic type I change at C5-6. 
No cord signal abnormality identified.

## 2021-04-24 IMAGING — MR MRI RIGHT ELBOW WITHOUT CONTRAST
4 of 7 series · 21 of 40 positions shown · IV contrast (gadolinium)
Comparison: None

________________________________________________________________________________________________ 
MRI RIGHT ELBOW WITHOUT CONTRAST, 04/24/2021 [DATE]: 
CLINICAL INDICATION: Right elbow pain and effusion. Ulnar surgery 7 years ago 
with postoperative staphylococcus infection. Episodic elbow locking.
TECHNIQUE: Multiplanar, multiecho position MR images of the elbow were performed 
without intravenous gadolinium enhancement. Patient was scanned on a
magnet.

[Series 301: survey mst right · axial · right · 5.0mm · 0.52mm/px · z∈[+27,+142]mm · 8 of 30 slices shown]
[im 1/30]
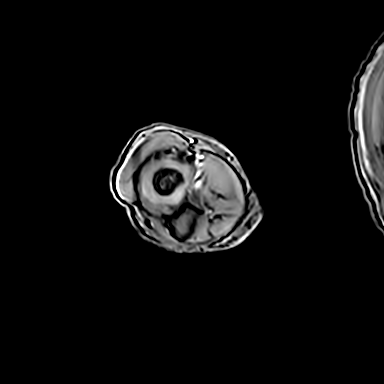
[im 5/30]
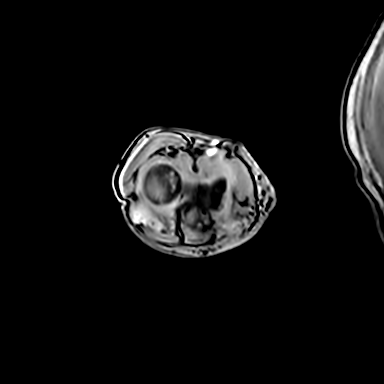
[im 9/30]
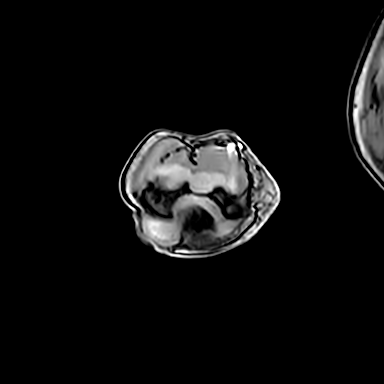
[im 13/30]
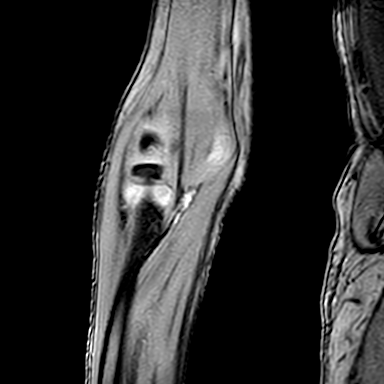
[im 17/30]
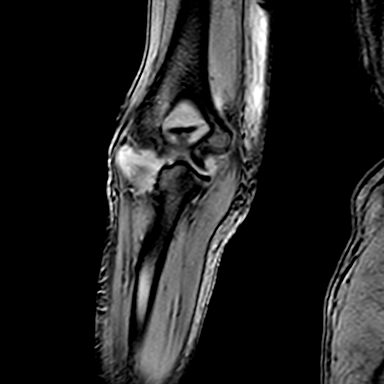
[im 21/30]
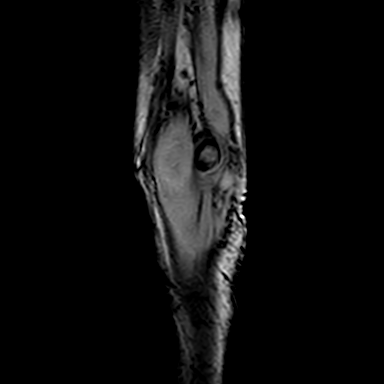
[im 25/30]
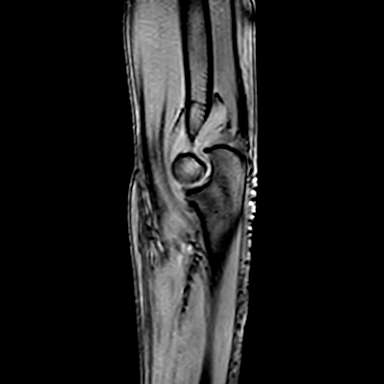
[im 30/30]
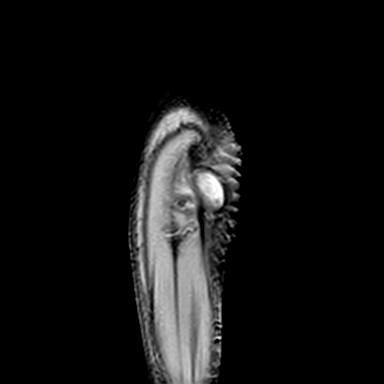

[Series 401: t1_(person_name) right · axial · right · 4.0mm · 0.25mm/px · z∈[-17,+122]mm · 7 of 32 slices shown]
[im 1/32]
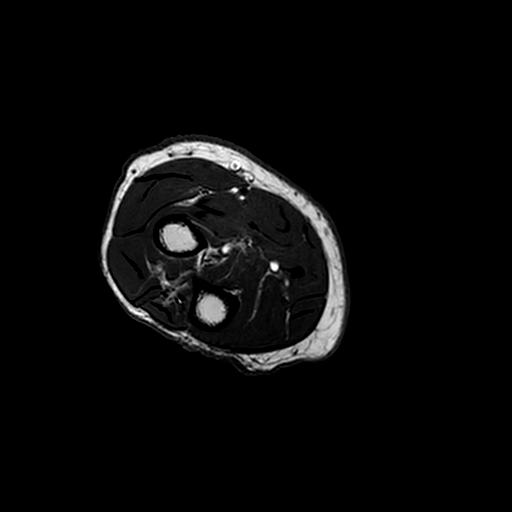
[im 6/32]
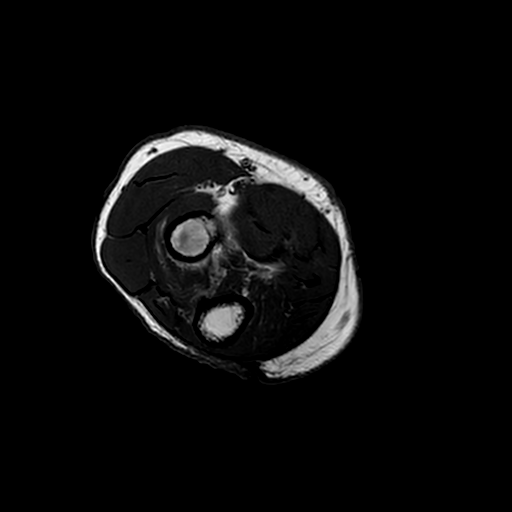
[im 11/32]
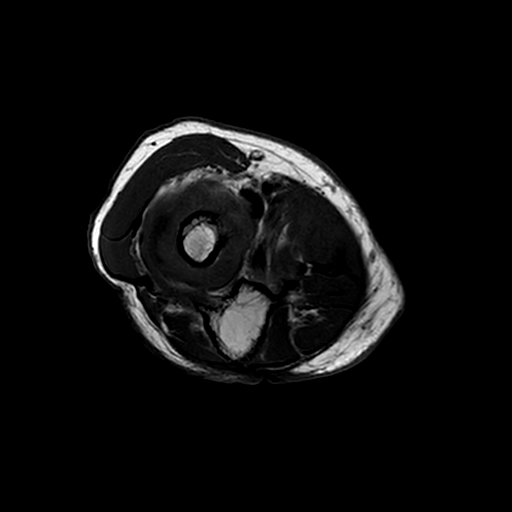
[im 16/32]
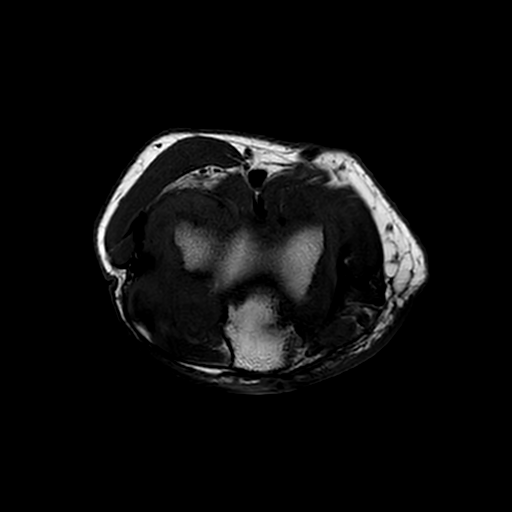
[im 21/32]
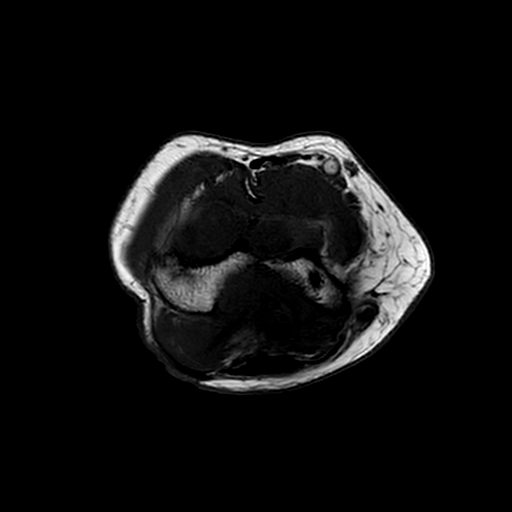
[im 26/32]
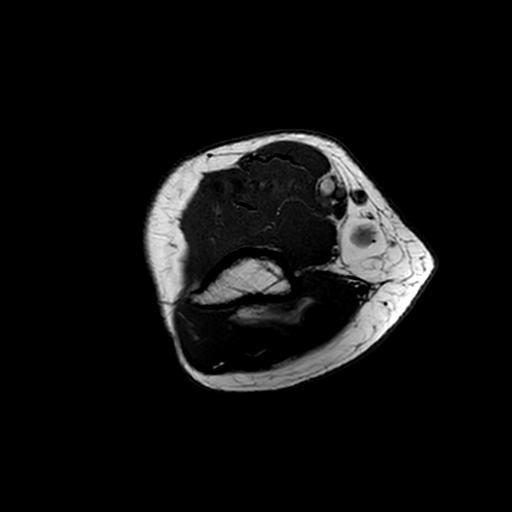
[im 32/32]
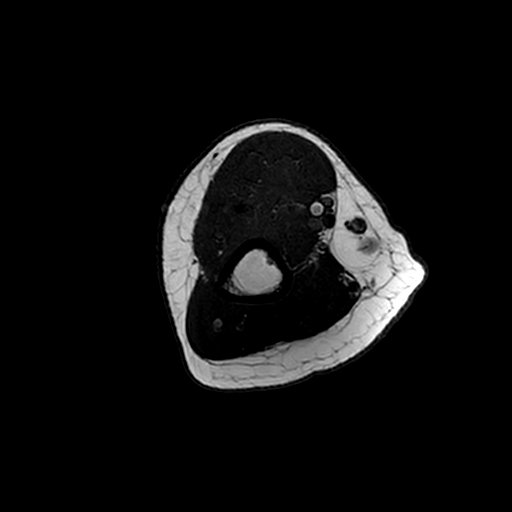

[Series 501: (person_name)_(person_name)_(person_name) right · axial · right · 4.0mm · 0.30mm/px · z∈[+6,+95]mm · 3 of 32 slices shown]
[im 6/32]
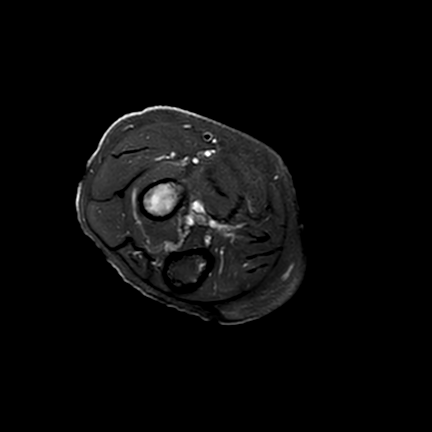
[im 16/32]
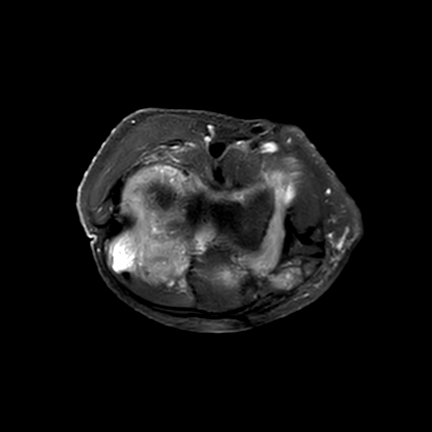
[im 26/32]
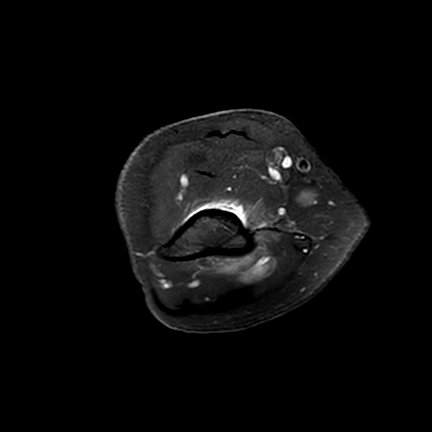

[Series 601: t1_cor right · coronal · right · 3.0mm · 0.20mm/px · 3 of 19 slices shown]
[im 1/19]
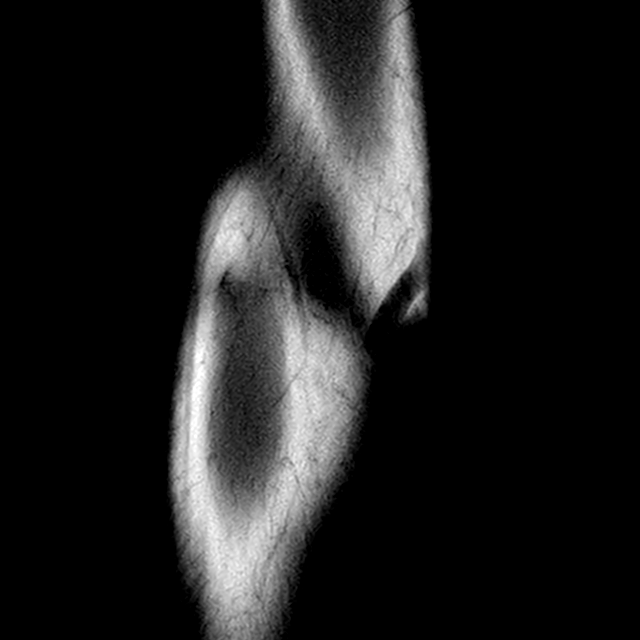
[im 13/19]
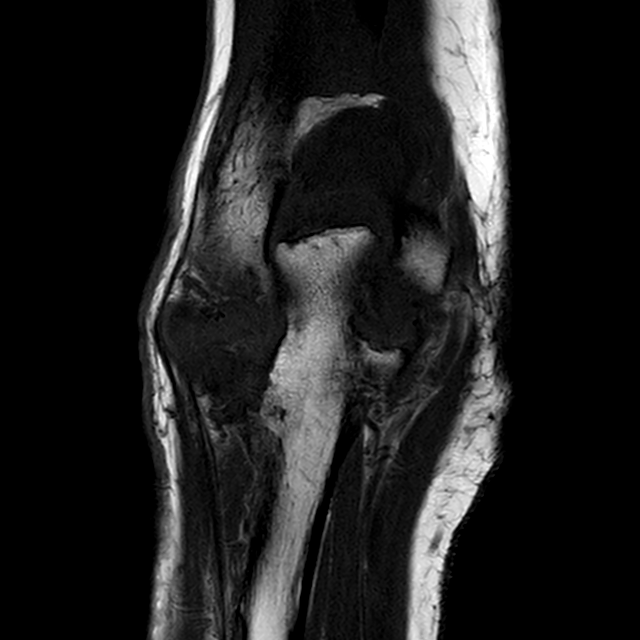
[im 19/19]
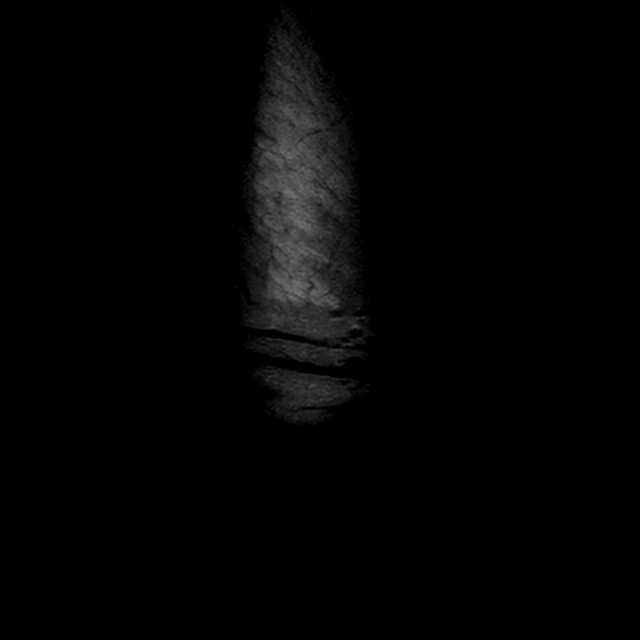

[21 of 40 positions shown; findings below may reference images not displayed]

FINDINGS: BONES AND JOINTS: Large joint effusion and marked synovitis. Diffuse 
full-thickness and partial-thickness cartilage loss throughout the elbow. Small 
erosions, the largest of which measures 0.5 cm in the capitellum and 0.2 cm in  
the radial head. Bone marrow edema in the proximal 5.5 cm of the radius. Marked 
narrowing of the ulnohumeral joint and mild widening (3 mm) of the 
radiocapitellar joint. Mild marrow edema in the lateral humeral condyle and 
olecranon without T1 marrow replacement. No fracture or evidence of acute 
osteomyelitis. Small radial tuberosity enthesophyte. 
LIGAMENTS: Rupture of the proximal anterior band of the ulnar collateral 
ligament. The posterior and transverse bands of the ulnar collateral ligament 
are intact. Ruptured radial collateral ligament proper and partial thickness 
tearing of the annular ligament and lateral ulnar collateral ligament. 
TENDONS: The origin of the common extensor tendon is intact. The origin of the 
common flexor tendon is preserved. The insertions of the biceps and brachialis 
are preserved. The insertion of the triceps is intact. 
SOFT TISSUES: Several prominent epitrochlear lymph nodes measure up to 0.9 x
cm in cross-section. Ulnar nerve release with the ulnar nerve extending medial 
to the medial humeral epicondyle. The ulnar nerve is normal in caliber with 
increased T2-weighted signal intensity at the level of the humeral epicondyle. 
Muscles are normal in signal intensity and caliber. No focal fluid collection or 
distended bursa.
IMPRESSION: 1.  Large elbow joint effusion, marked synovitis, diffuse cartilage loss, small 
erosions and bone marrow edema. Findings are consistent with sequela of septic 
arthritis and inflammatory arthropathy: Please correlate with any clinical 
findings for active septic arthritis. 
2.  Ruptured proximal anterior band of the ulnar collateral ligament and radial 
collateral ligament proper and partial thickness tearing of the annular and 
lateral ulnar collateral ligaments. 
3.  Several prominent epitrochlear lymph nodes. 
4.   Ulnar nerve release with increased T2-weighted signal intensity within the 
nerve at the level of the humeral epicondyle.  
I discussed findings with Ysn Glm, PA, by phone at [DATE] on 04/25/21.
